# Patient Record
Sex: Male | Born: 1940 | Race: White | Hispanic: No | Marital: Single
Health system: Southern US, Community
[De-identification: ages and names within clinical notes are randomized; demographics above are authoritative.]

## PROBLEM LIST (undated history)

## (undated) DIAGNOSIS — R32 Unspecified urinary incontinence: Secondary | ICD-10-CM

## (undated) DIAGNOSIS — E039 Hypothyroidism, unspecified: Secondary | ICD-10-CM

## (undated) DIAGNOSIS — F32A Depression, unspecified: Secondary | ICD-10-CM

## (undated) DIAGNOSIS — W19XXXA Unspecified fall, initial encounter: Secondary | ICD-10-CM

## (undated) DIAGNOSIS — K8020 Calculus of gallbladder without cholecystitis without obstruction: Secondary | ICD-10-CM

## (undated) DIAGNOSIS — H5702 Anisocoria: Secondary | ICD-10-CM

## (undated) DIAGNOSIS — S32509A Unspecified fracture of unspecified pubis, initial encounter for closed fracture: Secondary | ICD-10-CM

## (undated) DIAGNOSIS — I700 Atherosclerosis of aorta: Secondary | ICD-10-CM

## (undated) DIAGNOSIS — R140 Abdominal distension (gaseous): Secondary | ICD-10-CM

## (undated) DIAGNOSIS — F200 Paranoid schizophrenia: Secondary | ICD-10-CM

## (undated) DIAGNOSIS — R0602 Shortness of breath: Secondary | ICD-10-CM

## (undated) DIAGNOSIS — S3210XA Unspecified fracture of sacrum, initial encounter for closed fracture: Secondary | ICD-10-CM

## (undated) DIAGNOSIS — F79 Unspecified intellectual disabilities: Secondary | ICD-10-CM

## (undated) DIAGNOSIS — I639 Cerebral infarction, unspecified: Secondary | ICD-10-CM

## (undated) DIAGNOSIS — Z9181 History of falling: Secondary | ICD-10-CM

## (undated) DIAGNOSIS — I63511 Cerebral infarction due to unspecified occlusion or stenosis of right middle cerebral artery: Secondary | ICD-10-CM

## (undated) DIAGNOSIS — R739 Hyperglycemia, unspecified: Secondary | ICD-10-CM

## (undated) DIAGNOSIS — S0181XA Laceration without foreign body of other part of head, initial encounter: Secondary | ICD-10-CM

## (undated) DIAGNOSIS — F23 Brief psychotic disorder: Secondary | ICD-10-CM

## (undated) DIAGNOSIS — I6529 Occlusion and stenosis of unspecified carotid artery: Secondary | ICD-10-CM

## (undated) DIAGNOSIS — F329 Major depressive disorder, single episode, unspecified: Secondary | ICD-10-CM

## (undated) HISTORY — DX: Atherosclerosis of aorta: I70.0

## (undated) HISTORY — DX: Paranoid schizophrenia: F20.0

## (undated) HISTORY — DX: Occlusion and stenosis of unspecified carotid artery: I65.29

## (undated) HISTORY — DX: Unspecified fracture of sacrum, initial encounter for closed fracture: S32.10XA

## (undated) HISTORY — DX: Shortness of breath: R06.02

## (undated) HISTORY — DX: Cerebral infarction, unspecified: I63.9

## (undated) HISTORY — DX: Unspecified fall, initial encounter: W19.XXXA

## (undated) HISTORY — DX: Hypothyroidism, unspecified: E03.9

## (undated) HISTORY — DX: Calculus of gallbladder without cholecystitis without obstruction: K80.20

## (undated) HISTORY — DX: Anisocoria: H57.02

## (undated) HISTORY — DX: Unspecified urinary incontinence: R32

## (undated) HISTORY — DX: Unspecified fracture of unspecified pubis, initial encounter for closed fracture: S32.509A

## (undated) HISTORY — DX: Hyperglycemia, unspecified: R73.9

## (undated) HISTORY — DX: History of falling: Z91.81

## (undated) HISTORY — DX: Laceration without foreign body of other part of head, initial encounter: S01.81XA

## (undated) HISTORY — DX: Abdominal distension (gaseous): R14.0

## (undated) HISTORY — DX: Cerebral infarction due to unspecified occlusion or stenosis of right middle cerebral artery: I63.511

---

## 1997-09-07 ENCOUNTER — Other Ambulatory Visit: Admission: RE | Admit: 1997-09-07 | Discharge: 1997-09-07 | Payer: Self-pay | Admitting: Family Medicine

## 1997-09-07 ENCOUNTER — Ambulatory Visit (HOSPITAL_COMMUNITY): Admission: RE | Admit: 1997-09-07 | Discharge: 1997-09-07 | Payer: Self-pay | Admitting: Family Medicine

## 1997-09-10 ENCOUNTER — Other Ambulatory Visit: Admission: RE | Admit: 1997-09-10 | Discharge: 1997-09-10 | Payer: Self-pay | Admitting: Family Medicine

## 2000-08-11 ENCOUNTER — Encounter: Payer: Self-pay | Admitting: Family Medicine

## 2000-08-11 ENCOUNTER — Ambulatory Visit (HOSPITAL_COMMUNITY): Admission: RE | Admit: 2000-08-11 | Discharge: 2000-08-11 | Payer: Self-pay | Admitting: Family Medicine

## 2001-08-14 ENCOUNTER — Encounter (HOSPITAL_BASED_OUTPATIENT_CLINIC_OR_DEPARTMENT_OTHER): Payer: Self-pay | Admitting: General Surgery

## 2001-08-15 ENCOUNTER — Ambulatory Visit (HOSPITAL_COMMUNITY): Admission: RE | Admit: 2001-08-15 | Discharge: 2001-08-15 | Payer: Self-pay | Admitting: General Surgery

## 2001-08-15 ENCOUNTER — Encounter (INDEPENDENT_AMBULATORY_CARE_PROVIDER_SITE_OTHER): Payer: Self-pay | Admitting: Specialist

## 2005-07-22 ENCOUNTER — Ambulatory Visit (HOSPITAL_COMMUNITY): Admission: RE | Admit: 2005-07-22 | Discharge: 2005-07-22 | Payer: Self-pay | Admitting: Family Medicine

## 2005-12-13 ENCOUNTER — Encounter: Admission: RE | Admit: 2005-12-13 | Discharge: 2005-12-13 | Payer: Self-pay | Admitting: Gastroenterology

## 2005-12-19 ENCOUNTER — Encounter: Admission: RE | Admit: 2005-12-19 | Discharge: 2005-12-19 | Payer: Self-pay | Admitting: Gastroenterology

## 2006-03-01 ENCOUNTER — Ambulatory Visit (HOSPITAL_COMMUNITY): Admission: RE | Admit: 2006-03-01 | Discharge: 2006-03-01 | Payer: Self-pay | Admitting: Gastroenterology

## 2006-09-06 ENCOUNTER — Ambulatory Visit (HOSPITAL_COMMUNITY): Admission: RE | Admit: 2006-09-06 | Discharge: 2006-09-06 | Payer: Self-pay | Admitting: Gastroenterology

## 2007-01-11 ENCOUNTER — Inpatient Hospital Stay (HOSPITAL_COMMUNITY): Admission: EM | Admit: 2007-01-11 | Discharge: 2007-01-15 | Payer: Self-pay | Admitting: Emergency Medicine

## 2007-01-11 ENCOUNTER — Encounter: Payer: Self-pay | Admitting: Emergency Medicine

## 2007-05-13 ENCOUNTER — Ambulatory Visit (HOSPITAL_COMMUNITY): Admission: RE | Admit: 2007-05-13 | Discharge: 2007-05-13 | Payer: Self-pay | Admitting: Gastroenterology

## 2007-12-11 ENCOUNTER — Inpatient Hospital Stay (HOSPITAL_COMMUNITY): Admission: EM | Admit: 2007-12-11 | Discharge: 2007-12-17 | Payer: Self-pay | Admitting: Emergency Medicine

## 2007-12-13 ENCOUNTER — Ambulatory Visit: Payer: Self-pay | Admitting: Infectious Diseases

## 2007-12-22 ENCOUNTER — Inpatient Hospital Stay (HOSPITAL_COMMUNITY): Admission: EM | Admit: 2007-12-22 | Discharge: 2007-12-26 | Payer: Self-pay | Admitting: Emergency Medicine

## 2010-10-04 NOTE — Consult Note (Signed)
NAME:  Scott [name], Scott [name] NO.:  [id number]   MEDICAL RECORD NO.:  [id number]          PATIENT TYPE:  INP   LOCATION:  1511                         FACILITY:  [facility]   PHYSICIAN:  Lindaann Slough, M.D.  DATE OF BIRTH:  11/13/1940   DATE OF CONSULTATION:  12/14/2007  DATE OF DISCHARGE:                                 CONSULTATION   REASON FOR CONSULTATION:  Urethral bleeding.   The patient is 70 year old male resident of a group home with a history  of schizophrenia who was admitted on December 11, 2007 with confusion,  weakness, unsteadiness.  He was found to have urinary tract infection  and he was treated with Bactrim.  A Foley catheter was inserted in the  bladder and during his period of confusion he pulled it out and started  having urethral bleeding.  The catheter was then reinserted and left  indwelling and removed this morning, and after removal of the Foley  started having again a large amount of urethral bleeding.  The catheter  was reinserted in the bladder and started draining clear urine.  I was  then asked to see the patient for further evaluation and treatment.   PAST MEDICAL HISTORY:  There is a history of schizophrenia and he  resides in a group home.   PAST SURGICAL HISTORY:  Umbilical hernia repair.   MEDICATIONS:  1. Benztropine 1 mg daily.  2. Gabapentin 600 mg twice a day.  3. Levothyroxine 875 mcg daily.  4. Niacin 500 mg 4 times daily.  5. Paroxetine 40 mg a day.  6. Risperdal 1 mg 3 times daily.   SOCIAL HISTORY:  He lives in a group home.  He is a former smoker and  had smoked a half a pack a day.  He does not drink.   FAMILY HISTORY:  Positive for coronary artery disease in his father, and  hypertension.   REVIEW OF SYSTEMS:  Is as per history and no history could be obtained  from the patient.  When I saw him this evening he was complaining of  suprapubic pain.   PHYSICAL EXAMINATION:  This is a 70 year old male who has a history  of  schizophrenia.  He is alert but is not totally oriented.  Blood pressure  is 120/60, pulse 95, respirations 20, temperature 98.5.  SKIN: No rash  ABDOMEN:  Soft,  tender in the suprapubic area.  He has no hepatomegaly  and no splenomegaly.  Bladder is distended.  He has no inguinal  adenopathy.  Penis is circumcised.  There is a Foley catheter in the  urethra and the catheter does not appear to be in the bladder.  Scrotal  contents are within normal limits.  He has no testicular mass.  RECTAL EXAMINATION:  Sphincter tone is normal.  Prostate is enlarged 40  g without any nodules.   I deflated the balloon of the Foley catheter and repositioned the  catheter in the bladder and he spontaneously drained about 1800 mL of  clear urine.  He did not have any more suprapubic pain and the  suprapubic distention is no longer present.   Urine culture on 07/22 showed no growth.  BUN on July 25 was 8,  creatinine 0.68.   Urinary retention, urethral bleeding, schizophrenia.   SUGGESTIONS:  Leave Foley catheter indwelling for now.  Voiding trial  before discharge.  Ice packs to the penis.      Lindaann Slough, M.D.  Electronically Signed     MN/MEDQ  D:  12/14/2007  T:  12/14/2007  Job:  [id number]

## 2010-10-04 NOTE — Discharge Summary (Signed)
NAME:  Scott [name], LO NO.:  [id number]   MEDICAL RECORD NO.:  [id number]          PATIENT TYPE:  INP   LOCATION:  1522                         FACILITY:  [facility]   PHYSICIAN:  Isidor Holts, M.D.  DATE OF BIRTH:  02/01/1941   DATE OF ADMISSION:  01/11/2007  DATE OF DISCHARGE:  01/15/2007                               DISCHARGE SUMMARY   DISCHARGE DIAGNOSES:  1. Escherichia coli urinary tract infection.  2. Mild dehydration.  3. Altered mental status, secondary to #'s 1 and 2 above.  4. History of schizophrenia/autism.  5. Hypothyroidism.  6. Right inguino-scrotal hernia.   DISCHARGE MEDICATIONS:  1. Cogentin 1 mg p.o. daily.  2. Paxil 40 mg p.o. daily.  3. Risperdal 1 mg p.o. q.a.m. and 2 mg p.o. q.h.s.  4. Synthroid 75 mcg q. daily (was on 100 mcg p.o. daily).  5. Neurontin 600 mg p.o. t.i.d.  6. Niacin 500 mg p.o. daily.  7. Ciprofloxacin 500 mg p.o. b.i.d., to be completed on January 17, 2007.   PROCEDURES:  1. Chest x-ray dated January 11, 2007.  This was a low volume film with      prominent cardiomediastinal contours.  There was mild vascular      congestion without overt pulmonary edema.  2. Head CT scan dated January 11, 2007.  This showed chronic      microvascular changes.  No definite acute abnormality.  There was a      retention cyst in the right sphenoid sinus.   CONSULTATIONS:  None.   ADMISSION HISTORY:  As in H&P notes of January 11, 2007 dictated by Dr.  Della Goo.  However, in brief this is a 70 year old male resident  of a group home, with known history of schizophrenia, autism, obsessive-  compulsive disorder, hypothyroidism, who presents with altered mental  status described as confusion, and also weakness.  Reportedly, the  patient had been evaluated at [facility] for altered mental  status, was found to have a UTI, given a single dose of iv Rocephin and  sent home on oral antibiotic therapy.  He re-presented,  because of  continued symptoms and difficulty ambulating, was evaluated at [facility] ED, and was admitted for further evaluation,  investigation and management.   CLINICAL COURSE:  1. Urinary tract infection.  For details of presentation, refer to      admission history above.  The patient's urinalysis confirmed      positive sediment consistent with urinary tract infection.  He was      started on intravenous Ciprofloxacin.  Urine culture subsequently      grew E. Coli, which was pan-sensitive.  The patient responded to      above treatment measures.  WCC which was initially elevated at 12.3      on January 12, 2007 and dropped down to 5.1 on January 13, 2007.  The      patient remained afebrile during the course of his hospitalization,      became much more alert.  Mental status  appeared back to baseline,      and patient was able to ambulate, following evaluation by PT/OT on      January 14, 2007.   1. Mild dehydration.  The patient at time of initial presentation was      found to be mildly dehydrated.  BUN was 11, creatinine 0.72.  He      was managed with intravenous infusion of normal saline, with      satisfactory clinical response, and by January 13, 2007, BUN had      normalized at 7, with a creatinine of 0.67, patient's oral intake      was quite good, and IV fluids were therefore discontinued.   1. Hypothyroidism.  The patient is a known case of hypothyroidism, and      at presentation was on 100 mcg per day of Synthroid.  TSH, however,      was found to be somewhat low at 0.306.  Synthroid dosage has      therefore been decreased to 75 mcg p.o. daily.   1. Right inguino-scrotal hernia.  The patient apparently is known to      have this abnormality, and had been scheduled for an appointment      with Dr. Lurene Shadow, general surgeon, in the coming week.  We have      recommended that he keep this appointment, for definitive      management.  Certainly,  during the course of this hospitalization,      there was no problems referable to this, and objective examination      revealed no evidence of incarceration.   DISPOSITION:  The patient was considered sufficiently clinically  recovered and stable to be transitioned to oral antibiotic treatment on  January 14, 2007, for completion of a total of 7 days treatment to be  concluded on 01/17/07.  Provided no acute problems arise overnight, he  will be discharged on January 15, 2007, back to the group home.   DIET:  No restrictions, activity as tolerated.   FOLLOWUP INSTRUCTIONS:  The patient is to follow up with Dr. Lurene Shadow,  general surgeon, per prior scheduled appointment.      Isidor Holts, M.D.  Electronically Signed     CO/MEDQ  D:  01/14/2007  T:  01/14/2007  Job:  [id number]

## 2010-10-04 NOTE — Consult Note (Signed)
NAME:  AIZEN, DUVAL NO.:  [id number]   MEDICAL RECORD NO.:  [id number]          PATIENT TYPE:  INP   LOCATION:  1513                         [facility]AC[facility]  [facility]   PHYSICIAN:  Antonietta Breach, M.D.  DATE OF BIRTH:  1941/02/17   DATE OF CONSULTATION:  12/24/2007  DATE OF DISCHARGE:                                 CONSULTATION   REASON FOR CONSULTATION:  Psychosis.   HISTORY OF PRESENT ILLNESS:  Mr. Aleczander Fandino is a 70 year old male  admitted to the [facility] on December 22, 2007, with fever.   Mr. Lipsett has been demonstrating approximately 5 days of progressive  confusion, clouding of consciousness and thought disorganization.  Last  week, he pulled out an inflated Foley catheter which induced urethral  bleeding.   Mr. Batalla required 4 mg of Ativan over the past 24 hours, as well as 1  mg of Haldol   He continues with clouding of consciousness.   PAST PSYCHIATRIC HISTORY:  On review of the past medical record, the  patient developed a period of confusion in August 2008, that was  associated with a urinary tract infection.  He does have a history of  treatment with Paxil, Risperdal and Cogentin.   In the past medical record are diagnoses listed as schizophrenia, autism  and obsessive-compulsive disorder.   FAMILY PSYCHIATRIC HISTORY:  None known.   SOCIAL HISTORY:  The patients resides in a group home.  Occupation -  unemployed.  He does not drink alcohol or use illegal drugs.   PAST MEDICAL HISTORY:  Hypothyroidism, delirium, fever.   MEDICATIONS:  The MAR was reviewed.  1. The patient is on psychotropic medication.  2. Neurontin 500 mg b.i.d.  3. Synthroid 75 mcg daily.  4. Paxil 40 mg daily.  5. Risperdal 1 mg t.i.d.   ALLERGIES:  NO KNOWN DRUG ALLERGIES.   LABORATORY DATA:  Sodium 140, BUN 8, creatinine 0.71, glucose 89,  potassium 3.7, WBC 7.6, hemoglobin 12, platelet count 252.  Head CT  without contrast showed atrophy and  nonspecific white matter changes.  SGOT 75, SGPT 42.  RPR, TSH and ammonia all within normal limits.  EKG  QTC 466 milliseconds.   REVIEW OF SYSTEMS:  Constitutional:  head, eyes, ears, nose, throat,  mouth neurologic, psychiatric cardiovascular, respiratory,  gastrointestinal, genitourinary, skin, musculoskeletal, hematologic,  lymphatic, endocrine and metabolic all unremarkable.   PHYSICAL EXAMINATION:  VITAL SIGNS:  Temperature 98.8, pulse 107,  respiratory rate 25, blood pressure 120/79.  O2 saturation on room air  100%.  GENERAL APPEARANCE:  Mr. Christoffel is an elderly male reclining in his  hospital bed in a supine position with no abnormal involuntary  movements.   Bilateral elbow passive range of motion exam, no cogwheeling or  rigidity.   MENTAL STATUS EXAM:  Mr. Niblett does have agitation.  His eye contact is  poor.  He appears to be oriented to self.  When asked what his last name  is, he states his first name and then perseverates.  His attention is  decreased.  His concentration is poor.  He has clouding of  consciousness.  He does not have orientation grossly to time or place   Affect agitated, mood anxious.  Fund of knowledge and intelligence are  grossly below average.  Speech as above.  Thought process; perseveration  as above.  Thought content; the patient perseverates on his name.  There  is no evidence of current hallucinations or delusions, however, thought  content is difficult to discern.  His insight is obviously poor, as well  as his judgment.   ASSESSMENT:  AXIS I:  1. 293.00, delirium, not otherwise specified.  2. By history, rule out schizophrenia and obsessive-compulsive      disorder.  AXIS II:  Deferred.  AXIS III:  See past medical history.  AXIS IV:  General medical.  AXIS V:  Global Assessment of Functioning 20.   Mr. Mcfarland may be experiencing some akathisia.  It is noted that he was  on regular Cogentin at his group home.  It is also noted  that he is not  displaying rigidity or cogwheeling on Risperdal.   This suggests that he was on Cogentin for akathisia.  This cannot be  certain, however, a low-dose of Cogentin would have a low risk of  contributing to delirium and may help to reduce agitation.  The  akathisia is contributing to agitation.   RECOMMENDATIONS:  Would start Cogentin 1 mg p.o. daily   RECOMMENDATIONS:  1. If the patient is not taking Risperdal by mouth and refusing      medication, would discontinue Risperdal and start Haldol at 2 mg      p.o. IM or slow push IV b.i.d.  2. Low stimulation ego support.      Antonietta Breach, M.D.  Electronically Signed     JW/MEDQ  D:  12/24/2007  T:  12/24/2007  Job:  [id number]

## 2010-10-04 NOTE — H&P (Signed)
NAME:  Scott [name], Scott [name] NO.:  [id number]   MEDICAL RECORD NO.:  [id number]          PATIENT TYPE:  INP   LOCATION:  1522                         FACILITY:  [facility]   PHYSICIAN:  Della Goo, M.D. DATE OF BIRTH:  August 19, 1940   DATE OF ADMISSION:  01/11/2007  DATE OF DISCHARGE:                              HISTORY & PHYSICAL   PRIMARY CARE PHYSICIAN:  Unassigned.   CHIEF COMPLAINT:  Weakness.   HISTORY OF PRESENT ILLNESS:  This is a 70 year old male who was brought  to the emergency department from his group home secondary to complaints  of confusion and weakness.  The patient had been seen earlier at [facility] for confusion and was evaluated and found to have a  urinary tract infection, placed on oral antibiotic therapy of Cipro on  discharge and also had been administered Rocephin therapy IV times one  dose.  The patient returned to the group home.  However, continued to  worsen and had difficulty walking and standing and was transported back  to [facility] emergency department for reevaluation.Marland Kitchen   PAST MEDICAL HISTORY:  History of schizophrenia, infantile autism,  obsessive-compulsive disorder, hypothyroidism.   MEDICATIONS:  1. Cogentin 1 mg one p.o. daily  2. Paxil 40 mg one p.o. daily  3. Risperdal 1 mg p.o. at 8:00 a.m. and 12:00 a.m. and q.h.s.  4. Synthroid 0.1 mg one p.o. daily   PAST SURGICAL HISTORY:  History of an umbilical hernia repair.   SOCIAL HISTORY:  The patient lives at a group home.  He is a current  smoker, smokes a half-a-pack per day.  He is a nondrinker and no history  of drug abuse.   FAMILY HISTORY:  Positive for coronary artery disease in his father and  positive for hypertension in his family.  Most of the medical history  has been given by his sister who is at bedside.   PHYSICAL EXAMINATION FINDINGS:  This is an overweight 70 year old male  in no visible discomfort or distress.  His Vital Signs: Temperature  100.9, blood pressure 106/63, heart rate 92, respirations 26.  HEENT: Examination normocephalic, atraumatic.  Pupils equally round  reactive to light.  Extraocular muscles are intact, funduscopic benign,  oropharynx is clear.  NECK is supple full range of motion.  No thyromegaly, adenopathy,  jugular venous distention.  CARDIOVASCULAR:  Tachycardiac rate and rhythm.  No murmurs, gallops or  rubs.  LUNGS: Clear to auscultation bilaterally.  ABDOMEN:  Positive bowel sounds, soft, nontender, nondistended.  No  rebound or guarding.  No suprapubic tenderness.  No costovertebral angle  tenderness.  NEUROLOGIC:  Examination the patient is alert and oriented  x 3.  He is able to move all four of his extremities.  His gait appears  steady.  No gross focal deficits.   ASSESSMENT:  70 year old male being admitted with  1. Acute mental status changes.  2. Urinary tract infection.  3. Hypothyroidism.  4. Hypertension.  5. Infantile autism.  6. Schizophrenia.   PLAN:  The patient will be admitted, placed on IV antibiotic therapy of  ciprofloxacin.  IV fluids have also been ordered.  The patient will  continue on his regular medications and DVT and GI prophylaxis have been  ordered.      Della Goo, M.D.  Electronically Signed     HJ/MEDQ  D:  01/12/2007  T:  01/13/2007  Job:  [id number]

## 2010-10-04 NOTE — Discharge Summary (Signed)
NAME:  Scott [name], Scott [name] NO.:  [id number]   MEDICAL RECORD NO.:  [id number]          PATIENT TYPE:  INP   LOCATION:  1513                         FACILITY:  [facility]   PHYSICIAN:  Beckey Rutter, MD  DATE OF BIRTH:  08-03-1940   DATE OF ADMISSION:  12/22/2007  DATE OF DISCHARGE:  12/26/2007                               DISCHARGE SUMMARY   CHIEF COMPLAINT:  The patient brought into the ED because of frequent  falls.   PROBLEMS:  1. Frequent falls.  This is felt secondary to altered mental status.      At the time of the assessment, this altered mental status felt      secondary to a urinary tract infection on the baseline of changed      mentation and multiple psychotropic medications.  The patient      improved on the antibiotic ciprofloxacin and the fluids which also      stabilized his hypertension.  The patient then was transferred to      regular floor and he has been doing well on the floor, especially      after in the restraints were discontinued.  The patient will be      discharged to continue on ciprofloxacin and the psychotropic      medication as discussed below.  2. Schizophrenia, infantile autism, and obsessive-compulsive disorder.      For those multiple psychiatric problems, the patient was seen in      consultation by Dr. Antonietta Breach.  Dr. Jeanie Sewer recommended      Haldol 2 mg p.o. or intramuscularly, or even intravenously with IV      push if the patient was not be able to take Risperdal per mouth.  3. The patient was pulling his Foley catheter and he had probably      traumatized his urethra and bladder.  The patient has been stable      now after the Foley catheter was discontinued.  He is voiding      normally.   PROCEDURE:  Blood cultures were negative up to date.  This culture was  obtained and December 22, 2007.  Recent labs were showing sodium 139,  potassium 3.4, chloride 109, bicarb 23, glucose 94, BUN 4 and creatinine  0.7, white  blood count is 7.1, hemoglobin is 11.7, hematocrit 33.8 and  platelet count is 268,000.   HOSPITAL CONSULTATION:  Dr. Antonietta Breach, psychiatrist.   RADIOLOGICAL IMAGING:  The patient had chest x-ray on December 22, 2007  showing no acute cardiopulmonary abnormality.  The patient has renal  ultrasound which is showing the result negative for hydronephrosis,  probable cholelithiasis, right hepatic cyst.   DISCHARGE DIAGNOSES:  1. Altered mental status felt secondary to psychotropic medication and      urinary tract infection.  2. Schizophrenia.  3. Infantile autism.  4. Obsessive-compulsive disorder.  5. Multiple admission with febrile illness.  6. Hypothyroidism.   DISCHARGE MEDICATIONS:  1. Ciprofloxacin 500 mg p.o. b.i.d. for five more days.  2. Benzatropine mg p.o. daily.  3. Neurontin 600 mg t.i.d.  4. Synthroid 75 mg daily.  5. Niacin 500 mg 4 times a day.  6. Paroxetine 40 mg once a day.  7. Risperdal 1 mg t.i.d.  If the Risperdal cannot be given, the      patient should take Haldol 2 mg p.o. or IM or slow push of IV twice      a day.   DISCHARGE/PLAN:  The patient is stable for discharge today.  The  discharge plan was formulated after discussion with Dr. Antonietta Breach.  The patient might need followup with renal and abdominal ultrasound to  evaluate the hepatic cyst in 4 to 6 weeks.   CONDITION ON DISCHARGE:  Stable.      Beckey Rutter, MD  Electronically Signed     EME/MEDQ  D:  12/26/2007  T:  12/26/2007  Job:  [id number]

## 2010-10-04 NOTE — Consult Note (Signed)
NAME:  RAS, KOLLMAN NO.:  [id number]   MEDICAL RECORD NO.:  [id number]          PATIENT TYPE:  INP   LOCATION:  1513                         FACILITY:  [facility]   PHYSICIAN:  Antonietta Breach, M.D.  DATE OF BIRTH:  [birth year]   DATE OF CONSULTATION:  12/26/2007  DATE OF DISCHARGE:  12/26/2007                                 CONSULTATION   Scott [name] has responded well to his psychotropic medication which has  included regular Haldol 1 mg every 6 hours, which is currently written  p.r.n. With this combination and his Risperdal 1 mg t.i.d., with his  Cogentin, he is cooperative.  There is no agitation or combativeness.  He has no thoughts of harming himself or others.  He has no evidence of  internal stimulation or delusions.   He states that he likes country music when asked.  He does have a  limited response to questions and does not demonstrate adequate social  reciprocity.  However, this is part of his baseline autistic negative  symptom schizophrenic disposition.   He is not having any adverse medication effects.   REVIEW OF SYSTEMS:  NEUROLOGIC:  No stiffness or other extrapyramidal  side effects.   LABORATORY DATA:  Sodium 139, BUN 4, creatinine 0.70, glucose 94.  WBC  7.1, hemoglobin 11.7, platelet count 268.   EXAMINATION:  VITAL SIGNS:  Temperature 98.2, pulse 103, respiratory  rate 18, blood pressure 148/84, O2 saturation on room air 95%.   MENTAL STATUS EXAM:  Scott [name] is alert.  He avoids eye contact.  He is  oriented to self as well as place.  His thought process involves slight  echolalia.  There is coherent thought content.  No evidence of  hallucinations or paranoia.  He has no thoughts of harming himself or  others.  His mood is normal.  He states his interest in country music.   ASSESSMENT:  Axis I:  293.00 Delirium, not otherwise specified, now  clear.  298.83 Mood disorder, not otherwise specified, stable.  293.82 Psychotic disorder,  not otherwise specified, stable.   RECOMMENDATION:  Scott [name] has demonstrating that he is psychiatrically  cleared for discharge back to his group home.  1. Would continue his Cogentin 1 mg daily for anti-EPS .  2. Would continue Paxil 40 mg daily for antidepression.  3. Also would continue Risperdal 1 mg t.i.d. for antipsychosis and      mood stabilization.  4. Would continue to augment the Risperdal with Haldol 1 mg t.i.d.      This regimen has resulted in his      stabilization.  5. Psychiatric followup can be obtained at one of the clinics attached      to [facility], [facility] or [facility] as well as      the [facility].      Antonietta Breach, M.D.  Electronically Signed     JW/MEDQ  D:  12/26/2007  T:  12/26/2007  Job:  [id number]

## 2010-10-04 NOTE — H&P (Signed)
NAME:  Scott [name], Scott [name] NO.:  [id number]   MEDICAL RECORD NO.:  [id number]          PATIENT TYPE:  EMS   LOCATION:  ED                           FACILITY:  [facility]   PHYSICIAN:  Lucita Ferrara, MD         DATE OF BIRTH:  16-Sep-1940   DATE OF ADMISSION:  12/22/2007  DATE OF DISCHARGE:                              HISTORY & PHYSICAL   HISTORY OF PRESENT ILLNESS:  The patient is 70 year old with brought in  from skilled nursing facility with altered mental status, fevers and  fall at the skilled nursing facility.  Fall has not been documented here  as I do not have the records from skilled nursing facility.  I cannot  tell if there was any head trauma, but there is no obvious head trauma  by looking at the patient.  There is no reported history of nausea,  vomiting, diarrhea, cough.  Of note, the patient has recurrent similar  episodes in addition on January 12, 2007, readmission on December 11, 2007  with unsteadiness, weakness, fevers and probable urinary tract infection  during that time.  He was admitted December 11, 2007.  On December 14, 2007, he  was evaluated by Urology for urethral bleed.  He was discharged with  febrile illness of unknown etiology.  Septic workup including blood  cultures, urinalysis as well as chest x-ray was unremarkable.  The  patient was covered with empiric antibiotics, vancomycin and Rocephin.  The patient was evaluated by infectious disease specialist, Dr. Johny Sax who evaluated the patient and concluded this likely a viral  syndrome, although his H1N1 nasopharyngeal swabs were negative.  The  patient then was discharged home 2 days after due to a delay after he  had pulled out his urethral catheter and caused some local trauma.  At  the time of discharge, he is quite stable.  Upon arrival to the  emergency room, Foley catheter placed.  While it was inflated, the  patient pulled it out and had a copious amount of blood that was drained  and there  was discharge from the area.  Thereafter, Foley catheter was  reinserted, and so far the area has stopped bleeding.   PAST MEDICAL HISTORY:  1. Schizophrenia.  2. Infantile autism.  3. Obsessive compulsive disorder.  4. Recent admission for febrile illness.  5. Hypothyroidism.   PAST SURGICAL HISTORY:  History of umbilical hernia repair.   MEDICATIONS SKILLED NURSING FACILITY:  Per ED documentation.  1. Benztropine 1 mg p.o. daily.  2. Gabapentin 600 mg t.i.d.  3. Levothyroxine 75 mg daily.  4. Niacin 500 mg four times a day.  5. Paroxetine 40 mg once daily.  6. Risperdal 1 mg t.i.d.   SOCIAL HISTORY:  The patient was in a group home former smoker.  Denies  drugs alcohol.   FAMILY HISTORY:  Positive for coronary artery disease in father,  positive for hypertension in other family.   PHYSICAL EXAMINATION:  GENERAL:  The patient is in no acute distress.  He is confused and altered as far as  his mental status goes.  VITAL SIGNS:  Temperature 97.8, pulse 102, respirations 18, blood  pressure 89/67 as the lowest blood pressure, pulse ox 95% on room air.  HEENT:  Normocephalic, atraumatic.  Sclerae anicteric.  NECK:  Supple.  No JVD, no carotid bruits.  PERLA.  Extraocular  movements intact.  Mucous membranes dry.  CARDIOVASCULAR:  S1-S2 tachy.  LUNGS:  Clear to auscultation bilaterally.  No rhonchi, rales or  wheezes.  NEUROLOGICAL:  Cannot be fully assessed, but the patient is alert, not  oriented.  Cranial nerves II-XII grossly intact.   LABORATORY DATA/RADIOLOGICAL DATA:  Chest x-ray shows no acute  cardiopulmonary disease.  Urinalysis:  Complete metabolic panel shows  mildly low sodium 134, BUN is 17, creatinine is 1.44.  On day of  discharge, the patient's creatinine was 0.71.   ASSESSMENT:  A 66-year with;  1. Altered mental status from skilled nursing facility with baseline      schizophrenia and autism.  2. Questionable urinary tract infection that is recurrent  3.  Hypothyroidism.  4. Recent admission for similar episode altered mental status, febrile      illness, status post infectious disease consultation.  Presumably a      viral infection was the working diagnosis, status post CT scan of      the head which was negative and status post a urology consultation      for traumatic Foley catheter.  5. Hypotension.  6. History of traumatic hematuria secondary to Foley catheter that was      pulled out that was inflated.  Status post Urology evaluation.  7. Dehydration, likely secondary to prerenal azotemia, although given      that the patient has had traumatic areas to the urethra, rule out      postobstructive etiology.   DISCUSSION/PLAN:  The patient has quite altered mental status.  I  believe this is likely chronic with most likely exacerbation secondary  to urinary tract infection.  Will initiate IV fluids given his  hypotension.  Will initiate sepsis protocol.  Will initiate broad-  spectrum antibiotics.  Most likely needs Levaquin versus Primaxin for  urinary tract infection coverage.  Given his altered mental status, will  use restraints with caution.  Although, at this point, I do believe he  needs it secondary to causing self-harm with pulling out his Foley  catheter.  This is essential to measure his urine output, however.  Will  initiate metabolic workup with TSH, ammonia level, B12, RPR, folate.  His symptoms are nonfocal.  His CT scan of the head again is negative.  Urology will be consulted if the patient has persistent hematuria.  Will  apply ice to the area.  We may consider doing a CT scan of the abdomen  and pelvis versus bilateral renal ultrasound if his kidney function does  not improve.  Must rule out postobstructive neuropathy.  In addition to  above, I do believe that we will benefit from psychiatry consultation  given history of schizophrenia and given recurrent episodes of altered  mental status.  The consultation will be  for the purposes of medication  management.  I am going to put him on p.o. Zyprexa for now.      Lucita Ferrara, MD  Electronically Signed     RR/MEDQ  D:  12/22/2007  T:  12/22/2007  Job:  [id number]

## 2010-10-04 NOTE — Discharge Summary (Signed)
NAME:  Scott [name], Scott [name] NO.:  [id number]   MEDICAL RECORD NO.:  [id number]          PATIENT TYPE:  INP   LOCATION:  1511                         FACILITY:  [facility]   PHYSICIAN:  Isidor Holts, M.D.  DATE OF BIRTH:  07/13/1940   DATE OF ADMISSION:  12/11/2007  DATE OF DISCHARGE:  12/17/2007                               DISCHARGE SUMMARY   For discharge diagnoses, consultations, procedures, detailed clinical  course, refer to interim discharge summary dictated 12/15/2007.  In  addition, for the period from 12/16/2007 to 12/17/2007, the following  are pertinent:  The patient remained clinically stable.  However, he  once again pulled on his Foley catheter on 12/16/2007, with resultant  hematuria and some difficulty removing the balloon, which was lodged in  the urethra.  Dr. Vonita Moss, urologist, was called on consultation.  He  came over, successfully retrieved the Foley catheter by deflating  balloon.  Recommended replacement of Foley catheter which he did, and  application of ice packs.  There were no further incidents thereafter,  and we were successfully able to discontinue the patient's Foley  catheter in the a.m. of 12/17/2007 without any further incident.   DISPOSITION:  The patient was on 12/17/2007 considered clinically stable  for discharge.  He was therefore discharged accordingly.   SPECIAL INSTRUCTIONS:  Per family's request, the patient has been  restricted to only one or two soft drinks per day, at the group home.      Isidor Holts, M.D.  Electronically Signed     CO/MEDQ  D:  12/17/2007  T:  12/17/2007  Job:  [id number]

## 2010-10-04 NOTE — Op Note (Signed)
NAME:  PAYNE, GARSKE NO.:  [id number]   MEDICAL RECORD NO.:  [id number]          PATIENT TYPE:  AMB   LOCATION:  ENDO                         FACILITY:  [facility]   PHYSICIAN:  Graylin Shiver, M.D.   DATE OF BIRTH:  March 07, 1941   DATE OF PROCEDURE:  05/13/2007  DATE OF DISCHARGE:  05/13/2007                               OPERATIVE REPORT   PROCEDURE:  Colonoscopy to hepatic flexure.   INDICATION:  Suspected abnormality on barium enema in the left colon.   Informed consent was obtained after explanation of the risks of  bleeding, infection and perforation.   PREMEDICATION:  Fentanyl 100 mcg IV, Versed 10 mg IV.   PROCEDURE:  With the patient in the left lateral decubitus position, a  rectal exam was performed.  No masses were felt.  The Pentax colonoscope  was inserted into the rectum and advanced around a tortuous colon to the  region of the hepatic flexure.  The scope was brought out.  No  abnormalities were seen.  The cecum could not be reached.  There was no  suspicious abnormality in that portion, however, on barium enema.  He  tolerated the cecum well without complications.   IMPRESSION:  Normal colonoscopy to hepatic flexure.  No lesions seen.           ______________________________  Graylin Shiver, M.D.     SFG/MEDQ  D:  06/18/2007  T:  06/19/2007  Job:  [id number]

## 2010-10-04 NOTE — Discharge Summary (Signed)
NAME:  Scott [name], Scott [name] NO.:  [id number]   MEDICAL RECORD NO.:  [id number]          PATIENT TYPE:  INP   LOCATION:  1511                         FACILITY:  [facility]   PHYSICIAN:  Isidor Holts, M.D.  DATE OF BIRTH:  [birth year]   DATE OF ADMISSION:  12/11/2007  DATE OF DISCHARGE:  12/16/2007                               DISCHARGE SUMMARY   PRIMARY MEDICAL DOCTOR:  Gentry Fitz.   DISCHARGE DIAGNOSES:  1. Febrile illness secondary to viral syndrome.  2. Traumatic hematuria, secondary to Foley catheterization.  3. Hypothyroidism.  4. Smoking history.  5. Recent urinary tract infection.  6. History of schizophrenia/obsessive-compulsive disorder/infantile      autism.   DISCHARGE MEDICATIONS:  1. Benztropine 1 mg p.o. daily.  2. Gabapentin 600 mg p.o. b.i.d.  3. Levothyroxine 75 mcg p.o. daily.  4. Niacin 500 mg p.o. q.i.d.  5. Paroxetine 40 mg p.o. daily.  6. Risperidone 1 mg p.o. t.i.d.   PROCEDURES:  1. Chest x-ray dated December 09, 2007.  This showed low lung volumes      without acute cardiopulmonary process, upper thoracic compression      fracture age indeterminate.  Marked gaseous bowel distention in      upper abdomen.  2. Head CT scan dated December 12, 2007.  This was negative for bleed or      other acute intracranial process.  Atrophy and nonspecific white      matter changes.  Maxillary, ethmoid and sphenoid sinus disease.   CONSULTATIONS:  Lacretia Leigh. Ninetta Lights, M.D., Infectious Diseases  specialist.  Elfredia Nevins, MD., Urologist.   ADMISSION HISTORY:  As in H&P notes of December 09, 2007 dictated by Dr.  Lucita Ferrara.  However, in brief,  this is a 70 year old male resident of  group home, with known history of schizophrenia, infantile autism,  obsessive compulsive disorder, recurrent urinary tract infections,  hypothyroidism, smoking history, who presents with weakness,  unsteadiness and fever.  On initial evaluation he was found to have a  temperature of  103.4.  He was admitted for further evaluation,  investigation and management.   CLINICAL COURSE:  1. Febrile illness.  For details of presentation, refer to admission      history above.  Reportedly, the patient had been evaluated for      urinary tract infection 2-3 days prior to hospitalization, and      treated empirically with Bactrim.  However, he was found to be      febrile at the time of presentation.  Septic workup was carried      out, including blood cultures, urinalysis and cultures, as well as      chest x-ray.  Chest x-ray was unremarkable for acute pathology.      Urinalysis was negative for urinary tract infection.  Blood      cultures grew coagulase-negative Staphylococcus in 1 out of 4, but      this was deemed a contaminant.  At the time of presentation, he had      been empirically commenced on broad-spectrum antibiotic coverage  with a combination of Vancomycin and Rocephin.  Attempts were made      to do a lumbar puncture.  However, this did not prove practicable      as the patient was unable to remain still for the procedure.      Infectious diseases specialist consultation was kindly provided by      Dr. Johny Sax, who after careful evaluation of clinical data,      concluded that the patient likely had a viral syndrome.  He was      treated empirically with Tamiflu.  H1N1 nasopharyngeal swab was      sent for appropriate studies.  The patient is due to complete his      Tamiflu on December 16, 2007.  At the time of this dictation on December 15, 2007, H1N1 PCR reports were not yet available.  Be that as it      may, the patient defervesced by December 12, 2007 and has had no      recurrence of pyrexia since.  As mentioned above, it is likely that      the patient had a viral illness which has since resolved.   1. Recent urinary tract infection.  As mentioned in #1 above, patient      was treated with an empiric course of Bactrim in the group home.       Urinalysis during this hospitalization was negative.  Likely pre-      admission treatment was successful.   1. Hypothyroidism.  The patient was continued on pre-admission dosage      of Synthroid during this hospitalization.  TSH was normal at 0.427.   1. History of schizophrenia/obsessive compulsive disorder and      infantile autism.  The patient remained clinically stable from this      viewpoint, on pre-admission psychotropic medications.   1. Traumatic hematuria.  The patient on December 13, 2007, pulled out his      Foley catheter inadvertently, with balloon still intact.  This was      associated with some bleeding and gross hematuria, before his      catheter was reinserted without incident.  The patient was placed      under close observation.  By December 09, 2007 hematuria had cleared      up.  Foley catheter was therefore, discontinued at about 1:35 p.m.      on the same date.  However, once this was done, patient started      bleeding per urethram, necessitating reinsertion of Foley catheter      and requesting of Urology consultation, which was kindly provided      by Dr. Dr. Su Grand. For details of his consultation, refer to      consultation notes of  December 14, 2007.  He recommended leaving the      Foley catheter in situ, place ice packs to penis.  By December 15, 2007      no further hematuria was observed.  No further urethral bleeding.      Dr. Brunilda Payor has recommended a trial of removal of catheter in the a.m.      of July 27, 21009.   DISPOSITION:  The patient was on December 15, 2007, considered clinically  stable for discharge on December 16, 2007, back to the group home, provided  no acute problems arise in the interim.   DIET:  Heart-healthy.   ACTIVITY:  As tolerated.  SPECIAL INSTRUCTIONS:  Foley catheter will be discontinued in a.m. of  December 16, 2007.      Isidor Holts, M.D.  Electronically Signed     CO/MEDQ  D:  12/15/2007  T:  12/15/2007  Job:  [id number]

## 2010-10-04 NOTE — H&P (Signed)
NAME:  Scott [name], Scott [name] NO.:  [id number]   MEDICAL RECORD NO.:  [id number]          PATIENT TYPE:  INP   LOCATION:  1512                         FACILITY:  [facility]   PHYSICIAN:  Lucita Ferrara, MD         DATE OF BIRTH:  06/15/1940   DATE OF ADMISSION:  12/11/2007  DATE OF DISCHARGE:                              HISTORY & PHYSICAL   PRIMARY CARE PHYSICIAN:  Unassigned.   CHIEF COMPLAINT:  Unsteadiness, weakness and possible urinary tract  infection.   HISTORY OF PRESENT ILLNESS:  The patient is a 70 year old male with a  medical history of schizophrenia who presents to [facility] with chief complaint of confusion, weakness, and unsteadiness in  his feet. Symptoms have been ongoing for the last 2 or 3 days at which  urinalysis was conducted, and he was found to have a urinary tract  infection. He was placed empirically on Bactrim. Despite these efforts,  he continued to have worsening of his gait. He did not have any specific  falls. Given the circumstances, he was sent here for evaluation to the  emergency room. In the emergency room, urinalysis was found to be  negative. He was still quite unsteady on his feet. Further evaluation in  the ED found him to be mildly dehydrated with high BUN to creatinine  ratio. He denies any nausea or vomiting. He was found to have a  temperature of 103.4, and he was given Tylenol and not started on  empiric antibiotics just yet. As far as etiology for his fevers, he  denies any cough, shortness of breath. He denies recent travel or sick  contact. He denies diarrhea. Digital chest x-ray was conducted which was  essentially normal except it showed marked gaseous bowel distention in  the upper abdomen. Given these findings, more thorough history of as far  as his gastrointestinal system was conducted. He denies any focal  abdominal pain, nausea, vomiting, constipation or diarrhea although he  is not a good historian,  pertinent to history of present illness. The  patient has undergone a colonoscopy on May 13, 2007, for suspected  abnormality that was seen on barium enema in the left colon. Colonoscopy  was essentially normal. As far as his mental status goes, he denies any  focal neurological deficits. His mental status is essentially baseline.   PAST MEDICAL HISTORY:  Significant for:  1. Schizophrenia.  2. Infantile autism.  3. Obsessive compulsive disorder.  4. Recurrent urinary tract infection.  5. Hypothyroidism.   PAST SURGICAL HISTORY:  History of umbilical hernia repair.   MEDICATIONS:  1. Benztropine 1 mg p.o. daily.  2. Gabapentin 600 mg 2 times a day.  3. Levothyroxine 75 mcg daily.  4. Niacin 500 mg 4 times daily.  5. Paroxetine 40 mg daily.  6. Risperidone 1 mg 3 times a day.   SOCIAL HISTORY:  The patient lives in a group home. He has maintained  pretty constant functional status. He is a former smoker and smoked 1/2  pack per day. Denies drugs or alcohol.  FAMILY HISTORY:  Positive for coronary artery disease in his father and  positive for hypertension in his family.   PHYSICAL EXAMINATION:  Generally speaking, the patient is in no acute  distress. He is quite anxious, asking when he is going to be discharged  back to the group home.  His vital signs have been reviewed and as follows:  His T-max was 103.4,  respirations 20, pulse 88, blood pressure 132/78, pulse oximetry 99% on  room air.  HEENT:  Normocephalic, atraumatic. Sclerae nonicteric.  NECK:  Supple. No JVD. No carotid bruits. PERRLA. Extraocular muscles  intact.  CARDIOVASCULAR:  S1/S2. Regular rate and rhythm. No murmurs, rubs,  clicks.  ABDOMEN:  Soft, nontender, nondistended, positive bowel sounds.  LUNGS:  Clear to auscultation bilaterally. No rhonchi, rales or wheezes.  EXTREMITIES:  No clubbing, cyanosis, or edema.  NEUROLOGICAL:  The patient is anxious. He is alert and oriented x3.  Cranial nerves  II-XII grossly intact.   Chest x-ray shows marked gaseous bowel distention in the upper abdomen,  correlate clinically, otherwise no acute cardiopulmonary process. Upper  thoracic compression fracture age undetermined.   ASSESSMENT AND PLAN:  The patient is a 70 year old with:  1. Fevers of unknown source. No meningeal signs or neck pain, although      the patient is not the best historian.  2. Altered mental status from baseline/unsteady gait. Nonfocal      neurological evaluation. Again, no meningeal signs.  3. Recent urinary tract infection as outpatient but negative      urinalysis here.  4. Other Chronic issues: Hypothyroidism/schizophrenia/infantile      autism. All stable.  5. X-ray showing gaseous bowel distention with undistended abdomen.   DISCUSSION AND PLAN:  Obviously will need to rule out influenza H1N1. We  will have to repeat his urinalysis. Will initiate blood cultures.  Indications for antibiotics:  The patient is not hypotensive. There is  no leukocytosis, but patient does have a left shift. Given patient's  unclear baseline mental status, will definitely need to cover for  meningitis although less likely as patient has no meningeal signs. The  patient may have also have viral meningitis. Will initiate H1N1  protocol. Will empirically start him on Tamiflu, Rocephin x 1 and will  give him 1 dose of vancomycin x 1 for coverage, awaiting cultures.   Again, the rest of the plans are dependent on his progress.      Lucita Ferrara, MD  Electronically Signed     RR/MEDQ  D:  12/11/2007  T:  12/11/2007  Job:  [id number]

## 2010-10-07 NOTE — Op Note (Signed)
NAME:  Scott [name], GORRELL NO.:  [id number]   MEDICAL RECORD NO.:  [id number]          PATIENT TYPE:  AMB   LOCATION:  ENDO                         FACILITY:  [facility]   PHYSICIAN:  Graylin Shiver, M.D.   DATE OF BIRTH:  04-02-1941   DATE OF PROCEDURE:  09/06/2006  DATE OF DISCHARGE:                               OPERATIVE REPORT   PROCEDURE:  Attempted colonoscopy.   INDICATION:  Abnormal barium enema.   Informed consent was obtained from the power of attorney.   Sedation was achieved by the nurse anesthetist using propofol.   With the patient in the left lateral decubitus position a rectal exam  was performed.  No masses were felt.  The Pentax colonoscope was  inserted into the rectum, then into the sigmoid colon.  The patient was  so full of stool that I could not see the mucosa.  The procedure was  terminated.           ______________________________  Graylin Shiver, M.D.     SFG/MEDQ  D:  09/06/2006  T:  09/07/2006  Job:  [id number]

## 2010-10-07 NOTE — Op Note (Signed)
NAME:  Scott, [name] NO.:  [id number]   MEDICAL RECORD NO.:  [id number]          PATIENT TYPE:  AMB   LOCATION:  ENDO                         [facility]ACILITY:  [facility]   PHYSICIAN:  Graylin Shiver, M.D.   DATE OF BIRTH:  1940-12-02   DATE OF PROCEDURE:  03/01/2006  DATE OF DISCHARGE:                                 OPERATIVE REPORT   PROCEDURE:  Attempted colonoscopy.   INDICATIONS FOR PROCEDURE:  Abnormal barium enema.   Informed consent was obtained from the patient's power of attorney after  explanation of the risks of bleeding, infection and perforation.   The patient was sedated by the nurse anesthetist.   PROCEDURE:  With the patient in the left lateral decubitus position, a  rectal exam was performed.  I felt hard stool in the rectum.  The scope was  inserted into the rectum.  I saw hard stool.  I advanced it up into the  sigmoid to see if this would continue, and it did.  The patient was full of  stool despite taking his prep.  We were unable to see anything significant.  The procedure will have to be rescheduled           ______________________________  Graylin Shiver, M.D.     SFG/MEDQ  D:  03/01/2006  T:  03/03/2006  Job:  [id number]

## 2011-02-17 LAB — CBC
HCT: 36.4 — ABNORMAL LOW
HCT: 39.8
HCT: 39.8
HCT: 40
HCT: 40.4
HCT: 33.8 — ABNORMAL LOW
HCT: 34.6 — ABNORMAL LOW
Hemoglobin: 12.4 — ABNORMAL LOW
Hemoglobin: 12.6 — ABNORMAL LOW
Hemoglobin: 13.8
Hemoglobin: 13.8
Hemoglobin: 13.8
Hemoglobin: 11.3 — ABNORMAL LOW
Hemoglobin: 11.7 — ABNORMAL LOW
Hemoglobin: 12 — ABNORMAL LOW
MCHC: 34.1
MCHC: 34.2
MCHC: 34.5
MCHC: 34.6
MCHC: 34.7
MCV: 93.9
MCV: 94
MCV: 94.4
MCV: 94.7
Platelets: 147 — ABNORMAL LOW
Platelets: 252
Platelets: 282
RBC: 3.95 — ABNORMAL LOW
RBC: 4.26
RBC: 4.27
RBC: 4.28
RBC: 3.57 — ABNORMAL LOW
RDW: 12.1
RDW: 12.4
RDW: 12.4
RDW: 12.5
WBC: 6.5

## 2011-02-17 LAB — COMPREHENSIVE METABOLIC PANEL
ALT: 13
ALT: 15
ALT: 42
AST: 23
AST: 75 — ABNORMAL HIGH
Albumin: 3.9
Albumin: 2.7 — ABNORMAL LOW
Alkaline Phosphatase: 46
BUN: 12
CO2: 20
CO2: 21
CO2: 23
Calcium: 8.2 — ABNORMAL LOW
Calcium: 8.4
Calcium: 8.8
Creatinine, Ser: 0.77
Creatinine, Ser: 0.84
Creatinine, Ser: 1.11
GFR calc Af Amer: 59 — ABNORMAL LOW
GFR calc non Af Amer: >60
GFR calc non Af Amer: >60
Glucose, Bld: 100 — ABNORMAL HIGH
Glucose, Bld: 117 — ABNORMAL HIGH
Glucose, Bld: 135 — ABNORMAL HIGH
Potassium: 3.8
Potassium: 3.9
Sodium: 134 — ABNORMAL LOW
Total Bilirubin: 0.9
Total Bilirubin: 0.9
Total Protein: 4.6 — ABNORMAL LOW

## 2011-02-17 LAB — HIV-1 RNA ULTRAQUANT REFLEX TO GENTYP+
HIV 1 RNA Quant: <50 copies/mL (ref <50)
HIV-1 RNA Quant, Log: <1.7 (ref <1.70)

## 2011-02-17 LAB — DIFFERENTIAL
Basophils Absolute: 0
Basophils Relative: 0
Eosinophils Absolute: 0.2
Eosinophils Absolute: 0.3
Eosinophils Absolute: 0.3
Eosinophils Absolute: 0.1
Eosinophils Relative: 5
Eosinophils Relative: 0
Lymphocytes Relative: 2 — ABNORMAL LOW
Lymphocytes Relative: 38
Lymphs Abs: 0.1 — ABNORMAL LOW
Lymphs Abs: 0.4 — ABNORMAL LOW
Lymphs Abs: 2.7
Monocytes Absolute: 0.7
Monocytes Absolute: 1.2 — ABNORMAL HIGH
Monocytes Relative: 7
Neutrophils Relative %: 46
Neutrophils Relative %: 71

## 2011-02-17 LAB — BASIC METABOLIC PANEL
BUN: 8
BUN: 4 — ABNORMAL LOW
BUN: 8
CO2: 27
CO2: 28
Calcium: 8.5
Calcium: 8 — ABNORMAL LOW
Chloride: 109
Chloride: 110
Creatinine, Ser: 0.68
Creatinine, Ser: 0.7
GFR calc Af Amer: >60
GFR calc non Af Amer: >60
GFR calc non Af Amer: >60
Glucose, Bld: 112 — ABNORMAL HIGH
Glucose, Bld: 89
Glucose, Bld: 94
Potassium: 3 — ABNORMAL LOW
Potassium: 3.1 — ABNORMAL LOW
Potassium: 3.6
Potassium: 3.7
Sodium: 141
Sodium: 143

## 2011-02-17 LAB — URINALYSIS, ROUTINE W REFLEX MICROSCOPIC
Glucose, UA: NEGATIVE
Protein, ur: 30 — AB
Urobilinogen, UA: 1
pH: 5.5

## 2011-02-17 LAB — URINE CULTURE
Culture: NO GROWTH
Culture: NO GROWTH

## 2011-02-17 LAB — URINE MICROSCOPIC-ADD ON

## 2011-02-17 LAB — CULTURE, BLOOD (ROUTINE X 2)
Culture: NO GROWTH
Culture: NO GROWTH

## 2011-02-17 LAB — TSH: TSH: 0.427

## 2011-02-17 LAB — H1N1 SCREEN (PCR)

## 2011-03-03 LAB — CULTURE, BLOOD (ROUTINE X 2)
Culture: NO GROWTH
Culture: NO GROWTH
Culture: NO GROWTH

## 2011-03-03 LAB — URINE MICROSCOPIC-ADD ON

## 2011-03-03 LAB — DIFFERENTIAL
Basophils Absolute: 0
Basophils Relative: 0
Eosinophils Absolute: 0
Eosinophils Relative: 0
Lymphocytes Relative: 7 — ABNORMAL LOW
Lymphs Abs: 0.9
Monocytes Absolute: 1.4 — ABNORMAL HIGH
Monocytes Relative: 11
Neutro Abs: 10.1 — ABNORMAL HIGH
Neutrophils Relative %: 82 — ABNORMAL HIGH

## 2011-03-03 LAB — URINALYSIS, ROUTINE W REFLEX MICROSCOPIC
Bilirubin Urine: NEGATIVE
Glucose, UA: NEGATIVE
Ketones, ur: NEGATIVE
Nitrite: POSITIVE — AB
Protein, ur: 30 — AB
Specific Gravity, Urine: 1.021
Urobilinogen, UA: 1
pH: 6

## 2011-03-03 LAB — BASIC METABOLIC PANEL
BUN: 9
CO2: 20
CO2: 22
CO2: 24
Calcium: 8.2 — ABNORMAL LOW
GFR calc Af Amer: >60
GFR calc Af Amer: >60
GFR calc non Af Amer: >60
GFR calc non Af Amer: >60
Glucose, Bld: 105 — ABNORMAL HIGH
Glucose, Bld: 111 — ABNORMAL HIGH
Glucose, Bld: 139 — ABNORMAL HIGH
Potassium: 3.4 — ABNORMAL LOW
Potassium: 3.5
Potassium: 3.7
Sodium: 136
Sodium: 138
Sodium: 139

## 2011-03-03 LAB — CK TOTAL AND CKMB (NOT AT ARMC)
CK, MB: 1.2
Relative Index: 0.8
Total CK: 149

## 2011-03-03 LAB — COMPREHENSIVE METABOLIC PANEL
ALT: 13
Alkaline Phosphatase: 58
CO2: 23
GFR calc non Af Amer: >60
Glucose, Bld: 98
Potassium: 3.8
Sodium: 138
Total Bilirubin: 1.2

## 2011-03-03 LAB — URINE CULTURE: Colony Count: >=100000

## 2011-03-03 LAB — CBC
HCT: 39.6
HCT: 38.8 — ABNORMAL LOW
Hemoglobin: 13.8
Hemoglobin: 13.4
MCHC: 34.7
MCV: 91.2
Platelets: 178
RBC: 4.35
RBC: 4.24
RDW: 12.7
RDW: 12.7
WBC: 12.3 — ABNORMAL HIGH

## 2011-03-03 LAB — RAPID URINE DRUG SCREEN, HOSP PERFORMED
Amphetamines: NOT DETECTED
Barbiturates: NOT DETECTED
Benzodiazepines: NOT DETECTED
Cocaine: NOT DETECTED
Opiates: NOT DETECTED
Tetrahydrocannabinol: NOT DETECTED

## 2011-03-03 LAB — TROPONIN I: Troponin I: 0.03

## 2011-03-03 LAB — COMPREHENSIVE METABOLIC PANEL WITH GFR
AST: 20
Albumin: 3.7
BUN: 11
Calcium: 8.8
Chloride: 105
Creatinine, Ser: 0.72
GFR calc Af Amer: >60
Total Protein: 6.4

## 2011-03-03 LAB — LIPASE, BLOOD: Lipase: 21

## 2013-08-11 ENCOUNTER — Inpatient Hospital Stay (HOSPITAL_COMMUNITY)
Admission: EM | Admit: 2013-08-11 | Discharge: 2013-08-14 | DRG: 065 | Disposition: A | Discharge status: Rehabilitation Facility | Payer: Medicare Other | Attending: Internal Medicine | Admitting: Internal Medicine

## 2013-08-11 ENCOUNTER — Inpatient Hospital Stay (HOSPITAL_COMMUNITY): Payer: Medicare Other

## 2013-08-11 ENCOUNTER — Emergency Department (HOSPITAL_COMMUNITY): Payer: Medicare Other

## 2013-08-11 DIAGNOSIS — K5641 Fecal impaction: Secondary | ICD-10-CM | POA: Diagnosis present

## 2013-08-11 DIAGNOSIS — K567 Ileus, unspecified: Secondary | ICD-10-CM

## 2013-08-11 DIAGNOSIS — I6350 Cerebral infarction due to unspecified occlusion or stenosis of unspecified cerebral artery: Principal | ICD-10-CM | POA: Diagnosis present

## 2013-08-11 DIAGNOSIS — K560 Paralytic ileus: Secondary | ICD-10-CM | POA: Diagnosis present

## 2013-08-11 DIAGNOSIS — E039 Hypothyroidism, unspecified: Secondary | ICD-10-CM

## 2013-08-11 DIAGNOSIS — R29898 Other symptoms and signs involving the musculoskeletal system: Secondary | ICD-10-CM | POA: Diagnosis present

## 2013-08-11 DIAGNOSIS — R269 Unspecified abnormalities of gait and mobility: Secondary | ICD-10-CM | POA: Diagnosis present

## 2013-08-11 DIAGNOSIS — F200 Paranoid schizophrenia: Secondary | ICD-10-CM | POA: Diagnosis present

## 2013-08-11 DIAGNOSIS — R159 Full incontinence of feces: Secondary | ICD-10-CM | POA: Diagnosis present

## 2013-08-11 DIAGNOSIS — F79 Unspecified intellectual disabilities: Secondary | ICD-10-CM | POA: Diagnosis present

## 2013-08-11 DIAGNOSIS — I639 Cerebral infarction, unspecified: Secondary | ICD-10-CM

## 2013-08-11 HISTORY — DX: Hypothyroidism, unspecified: E03.9

## 2013-08-11 HISTORY — DX: Cerebral infarction, unspecified: I63.9

## 2013-08-11 LAB — RAPID URINE DRUG SCREEN, HOSP PERFORMED
Amphetamines: NOT DETECTED
BARBITURATES: NOT DETECTED
BENZODIAZEPINES: NOT DETECTED
COCAINE: NOT DETECTED
OPIATES: NOT DETECTED
Tetrahydrocannabinol: NOT DETECTED

## 2013-08-11 LAB — CBC
HEMATOCRIT: 40.6 % (ref 39.0–52.0)
Hemoglobin: 14.1 g/dL (ref 13.0–17.0)
MCH: 31.3 pg (ref 26.0–34.0)
MCHC: 34.7 g/dL (ref 30.0–36.0)
MCV: 90.2 fL (ref 78.0–100.0)
Platelets: 198 10*3/uL (ref 150–400)
RBC: 4.5 MIL/uL (ref 4.22–5.81)
RDW: 12.4 % (ref 11.5–15.5)
WBC: 6.5 10*3/uL (ref 4.0–10.5)

## 2013-08-11 LAB — COMPREHENSIVE METABOLIC PANEL
ALBUMIN: 3.8 g/dL (ref 3.5–5.2)
ALT: 7 U/L (ref 0–53)
AST: 14 U/L (ref 0–37)
Alkaline Phosphatase: 70 U/L (ref 39–117)
BUN: 18 mg/dL (ref 6–23)
CALCIUM: 9.3 mg/dL (ref 8.4–10.5)
CO2: 24 meq/L (ref 19–32)
CREATININE: 0.84 mg/dL (ref 0.50–1.35)
Chloride: 108 mEq/L (ref 96–112)
GFR calc Af Amer: >90 mL/min (ref >90)
GFR, EST NON AFRICAN AMERICAN: 85 mL/min — AB (ref >90)
Glucose, Bld: 108 mg/dL — ABNORMAL HIGH (ref 70–99)
Potassium: 4.4 mEq/L (ref 3.7–5.3)
Sodium: 143 mEq/L (ref 137–147)
Total Bilirubin: 0.3 mg/dL (ref 0.3–1.2)
Total Protein: 6.5 g/dL (ref 6.0–8.3)

## 2013-08-11 LAB — URINALYSIS, ROUTINE W REFLEX MICROSCOPIC
BILIRUBIN URINE: NEGATIVE
GLUCOSE, UA: NEGATIVE mg/dL
HGB URINE DIPSTICK: NEGATIVE
KETONES UR: NEGATIVE mg/dL
NITRITE: NEGATIVE
PH: 5.5 (ref 5.0–8.0)
Protein, ur: NEGATIVE mg/dL
Specific Gravity, Urine: 1.013 (ref 1.005–1.030)
Urobilinogen, UA: 0.2 mg/dL (ref 0.0–1.0)

## 2013-08-11 LAB — ETHANOL

## 2013-08-11 LAB — I-STAT TROPONIN, ED: TROPONIN I, POC: 0 ng/mL (ref 0.00–0.08)

## 2013-08-11 LAB — I-STAT CHEM 8, ED
BUN: 19 mg/dL (ref 6–23)
Calcium, Ion: 1.22 mmol/L (ref 1.13–1.30)
Chloride: 107 mEq/L (ref 96–112)
Creatinine, Ser: 1 mg/dL (ref 0.50–1.35)
Glucose, Bld: 108 mg/dL — ABNORMAL HIGH (ref 70–99)
HEMATOCRIT: 41 % (ref 39.0–52.0)
HEMOGLOBIN: 13.9 g/dL (ref 13.0–17.0)
Potassium: 4.2 mEq/L (ref 3.7–5.3)
SODIUM: 144 meq/L (ref 137–147)
TCO2: 24 mmol/L (ref 0–100)

## 2013-08-11 LAB — URINE MICROSCOPIC-ADD ON

## 2013-08-11 LAB — PROTIME-INR
INR: 1.17 (ref 0.00–1.49)
Prothrombin Time: 14.7 seconds (ref 11.6–15.2)

## 2013-08-11 LAB — APTT: aPTT: 43 seconds — ABNORMAL HIGH (ref 24–37)

## 2013-08-11 LAB — DIFFERENTIAL
BASOS ABS: 0 10*3/uL (ref 0.0–0.1)
BASOS PCT: 1 % (ref 0–1)
EOS PCT: 1 % (ref 0–5)
Eosinophils Absolute: 0.1 10*3/uL (ref 0.0–0.7)
Lymphocytes Relative: 17 % (ref 12–46)
Lymphs Abs: 1.1 10*3/uL (ref 0.7–4.0)
MONO ABS: 0.7 10*3/uL (ref 0.1–1.0)
Monocytes Relative: 10 % (ref 3–12)
Neutro Abs: 4.6 10*3/uL (ref 1.7–7.7)
Neutrophils Relative %: 71 % (ref 43–77)

## 2013-08-11 MED ORDER — ACETAMINOPHEN 325 MG PO TABS
650.0000 mg | ORAL_TABLET | ORAL | Status: DC | PRN
  Administered 2013-08-11: 650 mg via ORAL
  Filled 2013-08-11: qty 2

## 2013-08-11 MED ORDER — RISPERIDONE 1 MG PO TABS
1.0000 mg | ORAL_TABLET | Freq: Every morning | ORAL | Status: DC
  Administered 2013-08-12 – 2013-08-14 (×3): 1 mg via ORAL
  Filled 2013-08-11 (×3): qty 1

## 2013-08-11 MED ORDER — SENNOSIDES-DOCUSATE SODIUM 8.6-50 MG PO TABS
1.0000 | ORAL_TABLET | Freq: Every evening | ORAL | Status: DC | PRN
  Filled 2013-08-11: qty 1

## 2013-08-11 MED ORDER — ENOXAPARIN SODIUM 40 MG/0.4ML ~~LOC~~ SOLN
40.0000 mg | SUBCUTANEOUS | Status: DC
  Administered 2013-08-11 – 2013-08-13 (×3): 40 mg via SUBCUTANEOUS
  Filled 2013-08-11 (×4): qty 0.4

## 2013-08-11 MED ORDER — PAROXETINE HCL 20 MG PO TABS
20.0000 mg | ORAL_TABLET | Freq: Every day | ORAL | Status: DC
  Administered 2013-08-12 – 2013-08-14 (×3): 20 mg via ORAL
  Filled 2013-08-11 (×3): qty 1

## 2013-08-11 MED ORDER — SODIUM CHLORIDE 0.9 % IV SOLN
INTRAVENOUS | Status: AC
  Administered 2013-08-11: 1000 mL via INTRAVENOUS

## 2013-08-11 MED ORDER — LEVOTHYROXINE SODIUM 75 MCG PO TABS
75.0000 ug | ORAL_TABLET | Freq: Every day | ORAL | Status: DC
  Administered 2013-08-12 – 2013-08-14 (×3): 75 ug via ORAL
  Filled 2013-08-11 (×4): qty 1

## 2013-08-11 MED ORDER — GABAPENTIN 300 MG PO CAPS
600.0000 mg | ORAL_CAPSULE | Freq: Three times a day (TID) | ORAL | Status: DC
  Administered 2013-08-11 – 2013-08-14 (×9): 600 mg via ORAL
  Filled 2013-08-11 (×10): qty 2

## 2013-08-11 MED ORDER — RISPERIDONE 2 MG PO TABS
2.0000 mg | ORAL_TABLET | Freq: Every day | ORAL | Status: DC
  Administered 2013-08-11 – 2013-08-13 (×3): 2 mg via ORAL
  Filled 2013-08-11 (×4): qty 1

## 2013-08-11 MED ORDER — BENZTROPINE MESYLATE 0.5 MG PO TABS
0.5000 mg | ORAL_TABLET | Freq: Two times a day (BID) | ORAL | Status: DC
  Administered 2013-08-11 – 2013-08-14 (×6): 0.5 mg via ORAL
  Filled 2013-08-11 (×7): qty 1

## 2013-08-11 NOTE — ED Notes (Signed)
Patient transported to X-ray 

## 2013-08-11 NOTE — ED Notes (Signed)
Continuing to safety sit with patient.

## 2013-08-11 NOTE — ED Provider Notes (Signed)
CSN: [id number]     Arrival date & time 08/11/13  1254 History   First MD Initiated Contact with Patient 08/11/13 1255     Chief Complaint  Patient presents with  . right leg weakness    . Code Stroke     (Consider location/radiation/quality/duration/timing/severity/associated sxs/prior Treatment) HPI Comments: Patient is a 73 year old male with a past medical history of mental retardation and paranoid schizophrenia who presents to the emergency department via EMS from an adult a day care planing of a quick episode of right leg weakness. Per EMS, patient was walking around daycare when his right leg "gave out" causing him to fall, no head injury or trauma. On arrival to the emergency department, patient reports his leg is better. Caregiver from assisted living facility states he normally walks around on his own very well. He is at his baseline mentation, no recent fevers or illness. Patient denies any complaints at this time. Level V caveat due to mental retardation.  The history is provided by the patient, the EMS personnel and a caregiver.    No past medical history on file. No past surgical history on file. No family history on file. History  Substance Use Topics  . Smoking status: Not on file  . Smokeless tobacco: Not on file  . Alcohol Use: Not on file    Review of Systems  Unable to perform ROS: Other      Allergies  Review of patient's allergies indicates not on file.  Home Medications   Current Outpatient Rx  Name  Route  Sig  Dispense  Refill  . benztropine (COGENTIN) 0.5 MG tablet   Oral   Take 0.5 mg by mouth 2 (two) times daily.         Marland Kitchen. gabapentin (NEURONTIN) 300 MG capsule   Oral   Take 600 mg by mouth 3 (three) times daily.         Marland Kitchen. levothyroxine (SYNTHROID, LEVOTHROID) 75 MCG tablet   Oral   Take 75 mcg by mouth daily before breakfast.         . PARoxetine (PAXIL) 20 MG tablet   Oral   Take 20 mg by mouth daily.         . risperiDONE  (RISPERDAL) 1 MG tablet   Oral   Take 1 mg by mouth every morning.         . risperiDONE (RISPERDAL) 2 MG tablet   Oral   Take 2 mg by mouth at bedtime.          BP 127/94  Pulse 79  Temp(Src) 98.1 F (36.7 C) (Oral)  Resp 22  SpO2 93% Physical Exam  Nursing note and vitals reviewed. Constitutional: He is oriented to person, place, and time. He appears well-developed and well-nourished. No distress.  HENT:  Head: Normocephalic and atraumatic.  Mouth/Throat: Oropharynx is clear and moist.  Eyes: Conjunctivae and EOM are normal. Pupils are equal, round, and reactive to light.  Neck: Normal range of motion. Neck supple.  Cardiovascular: Normal rate, regular rhythm and normal heart sounds.   Pulmonary/Chest: Effort normal and breath sounds normal.  Abdominal: Soft. Bowel sounds are normal. There is no tenderness.  Musculoskeletal: Normal range of motion. He exhibits no edema and no tenderness.  Neurological: He is alert and oriented to person, place, and time. No cranial nerve deficit or sensory deficit. GCS eye subscore is 4. GCS verbal subscore is 5. GCS motor subscore is 6.  Strength upper extremities 5/5 and equal  bilateral. Slight weakness of right LE noted 4/5 compared to 5/5 on left LE. Sensation intact.  Skin: Skin is warm and dry. He is not diaphoretic.  Psychiatric: He has a normal mood and affect. He is slowed.    ED Course  Procedures (including critical care time) Labs Review Labs Reviewed  APTT - Abnormal; Notable for the following:    aPTT 43 (*)    All other components within normal limits  COMPREHENSIVE METABOLIC PANEL - Abnormal; Notable for the following:    Glucose, Bld 108 (*)    GFR calc non Af Amer 85 (*)    All other components within normal limits  I-STAT CHEM 8, ED - Abnormal; Notable for the following:    Glucose, Bld 108 (*)    All other components within normal limits  ETHANOL  PROTIME-INR  CBC  DIFFERENTIAL  URINE RAPID DRUG SCREEN  (HOSP PERFORMED)  URINALYSIS, ROUTINE W REFLEX MICROSCOPIC  I-STAT TROPOININ, ED  I-STAT TROPOININ, ED   Imaging Review Ct Head Wo Contrast  08/11/2013   CLINICAL DATA:  Right leg weakness.  EXAM: CT HEAD WITHOUT CONTRAST  TECHNIQUE: Contiguous axial images were obtained from the base of the skull through the vertex without intravenous contrast.  COMPARISON:  12/12/2007  FINDINGS: Area of low density noted within the left frontal lobe, likely subacute infarct. No hemorrhage. No other areas of cortical infarct. Chronic microvascular changes in the deep white matter. Diffuse cerebral atrophy. No hydrocephalus or midline shift.  No acute calvarial abnormality. Probable retention cyst in the right sphenoid sinus. Paranasal sinuses and mastoids otherwise clear. Orbital soft tissues unremarkable.  IMPRESSION: Area of low density in the left frontal lobe, favor subacute infarct.  Atrophy, chronic microvascular disease.  Critical Value/emergent results attempted to be called to Dr. Amada Jupiter beginning at 2:39 p.m. I have paged Dr. Amada Jupiter 3 times but never received a return call, the last attempt occurring at 3:04 p.m.   Electronically Signed   By: Charlett Nose M.D.   On: 08/11/2013 15:05   Mr Brain Wo Contrast  08/11/2013   CLINICAL DATA:  73 year old mental challenged patient with right lower extremity weakness. Code stroke. Previous left MCA territory infarcts.  EXAM: MRI HEAD WITHOUT CONTRAST  TECHNIQUE: Multiplanar, multiecho pulse sequences of the brain and surrounding structures were obtained without intravenous contrast.  COMPARISON:  Head CT without contrast 08/11/2013.  FINDINGS: By stroke team request only axial and coronal diffusion weighted imaging plus axial FLAIR was performed. Study is intermittently degraded by motion artifact.  Subtle areas of asymmetric increased trace diffusion with evidence of restricted diffusion at the left motor strip medial to the upper extremity representation, area  of trunk and proximal right lower extremity representation. See series 3, image 26). This corresponds to series 4, image 10 on coronal diffusion imaging, were the asymmetry is less conspicuous. No FLAIR hyperintensity in this area.  Chronic small to moderate size cortically based infarcts with encephalomalacia in the more anterior left frontal lobe, including pre motor and speech areas (series 300, image 22).  No midline shift, mass effect, or evidence of intracranial mass lesion. No ventriculomegaly.  Right sphenoid sinus mucous retention cyst.  IMPRESSION: Small areas of cortically based restricted diffusion suspected in the medial left motor strip, corresponding to right trunk and proximal right lower extremity representation.  Study reviewed in person with Dr. Ritta Slot on 08/11/2013 at 15:07 hours .   Electronically Signed   By: Augusto Gamble M.D.   On:  08/11/2013 15:16     EKG Interpretation   Date/Time:  Monday August 11 2013 12:55:54 EDT Ventricular Rate:  83 PR Interval:  161 QRS Duration: 90 QT Interval:  387 QTC Calculation: 455 R Axis:   53 Text Interpretation:  Sinus rhythm Consider left atrial enlargement  Abnormal R-wave progression, early transition Abnormal inferior Q waves No  significant change since last tracing Confirmed by [facility]  MD, Jonny Ruiz  [phone]) on 08/11/2013 1:21:38 PM      MDM   Final diagnoses:  CVA (cerebral vascular accident)   Pt presenting with right leg weakness. He is well appearing and in NAD, VSS. Exam difficult due to pt's mentation. At baseline per caregiver. Slight right lower extremity weakness noted. Pt unable to ambulate on right leg. Code stroke activated and pt evaluated by neurolo-hospitalist Dr. Amada Jupiter. CT head showing area of low density in the left frontal lobe, favoring subacute infarct. MRI ordered by neurology, results showing small areas of cortically based restricted diffusion suspected in the medial left motor strip, corresponding  to the right trunk and proximal lower extremity representation. Out of TPA window, pt will be admitted to medicine. Admission accepted by Dr. Elvera Lennox, [facility]. Case discussed with attending Dr. Fonnie Jarvis who also evaluated patient and agrees with plan of care.   Trevor Mace, PA-C 08/11/13 1605

## 2013-08-11 NOTE — ED Notes (Signed)
Attempted to call report. Was told that Charge RN was changing room assignment and would call back with new room.

## 2013-08-11 NOTE — ED Notes (Signed)
Safety Sitting with patient.

## 2013-08-11 NOTE — ED Notes (Signed)
To ED via [facility] from adult day care- with staff c/o right leg "not working right" -- hx of Mentally challenged/paranoid schizophrenia.

## 2013-08-11 NOTE — Consult Note (Signed)
Neurology Consultation Reason for Consult: Right leg weakness Referring Physician: Wayland SalinasJohn [name]  CC: Abnormal walking  History is obtained from: Caregivers  HPI: Scott [name] is a 73 y.o. male with a history of mental retardation who presents with sudden onset right leg weakness that started earlier today. He was seen normal around 11:30 AM, and shortly after that it was noticed that he stumbled and was not walking as well with his right side. He was therefore brought to the ER for evaluation where a code stroke was called.    LKW: 11:30 AM  tpa given?: no, After discussion with his sister and offering IV TPA, she decided not to pursue t-PA.     ROS: A 14 point ROS was performed and is negative except as noted in the HPI.  Past medical history Mental retardation Schizophrenia "prediabetes "  Family History: Unable to assess secondary to patient's altered mental status.    Social History: Tob: Unable to assess secondary to patient's altered mental status.    Exam: Current vital signs: BP 147/81  Pulse 75  Temp(Src) 98.1 F (36.7 C) (Oral)  Resp 24  SpO2 94% Vital signs in last 24 hours: Temp:  [97.7 F (36.5 C)-98.1 F (36.7 C)] 98.1 F (36.7 C) (03/23 1535) Pulse Rate:  [73-100] 75 (03/23 1707) Resp:  [17-27] 24 (03/23 1707) BP: (127-157)/(76-94) 147/81 mmHg (03/23 1707) SpO2:  [92 %-98 %] 94 % (03/23 1630)  Exam is limited due to patient agitation and poor ability to cooperate General: in bed, NAD CV: RRR Mental Status: Patient is awake, alert, oriented to person, repeatedly asks to go home. Mental retardation is present.  No signs of aphasia or neglect Cranial Nerves: II: Visual Fields are full. Pupils are equal, round, and reactive to light.  Discs are difficult to visualize. III,IV, VI: EOMI without ptosis or diploplia.  V: Facial sensation is symmetric to temperature VII: Facial movement is symmetric.  VIII: hearing is intact to voice X: Uvula  elevates symmetrically XI: Shoulder shrug is symmetric. XII: tongue is midline without atrophy or fasciculations.  Motor: Tone is normal. Bulk is normal. 5/5 strength was present in arms and left leg. He has a mild weakness of his right lower extremity with difficulty disproportionate to his strength  Sensory: Sensation appears symmetric to light touch and temperature in the arms and legs. Though he is very inconsistent with his answers Deep Tendon Reflexes: 2+ and symmetric in the biceps and patellae.  Cerebellar: FNF and HKS are impairid bilaterally Gait: Patient drags right foot when attempting to walk.    I have reviewed labs in epic and the results pertinent to this consultation are: Cbc, inr unremarkabl.   I have reviewed the images obtained:MRI brain - there is restricted diffusion in the motor strip which would correspond well to his symptoms.   Impression: [age] yo M with with a history of previous strokes based on imaging who presents with sudden onset right leg weakness and has small area of ischemia on MRI. Given his mild symptoms, the pros and cons of TPA were discussed with his sister, and TPA was offered, but she felt with the risks she would favor not giving the medication at this time.  Recommendations: 1. HgbA1c, fasting lipid panel 2. MRI, MRA  of the brain without contrast 3. Frequent neuro checks 4. Echocardiogram 5. Carotid dopplers 6. Prophylactic therapy-Antiplatelet med: Aspirin - dose 325mg  po or 300mg  pr 7. Risk factor modification 8. Telemetry monitoring 9. PT consult,  OT consult, Speech consult    Ritta Slot, MD [facility] [phone]  If 7pm- 7am, please page neurology on call as listed in AMION.

## 2013-08-11 NOTE — H&P (Signed)
History and Physical    Scott [name]:[id number] DOB: 10-02-1940 DOA: 08/11/2013  Referring physician: Dr. Fonnie Jarvis PCP: No primary provider on file.  Specialists: neurology   Chief Complaint: right leg weakness  HPI: Scott [name] is a 73 y.o. male has a past medical history significant for paranoid schizophrenia, currently living in a group home, hypothyroidism, presents to the emergency room with a chief complaint of right leg weakness, at baseline he is able to walk without any problems, however today his right leg "gave out" causing him to fall. On arrival to the emergency room, stroke code was initiated and neurology evaluated the patient by the time I was asked for admission. Stat CT head with area of low density in the left frontal lobe, likely representing subacute infarct, stat MRI with cortically based restricted diffusion suspected in the medial left motor strip. Patient currently has no complaints and asks repeatedly when he will go home. He denies any current symptoms other than the right lower leg weakness, which he appreciates has improved. Specifically, he denies any chest pain, breathing difficulties, he denies any fever or chills, he denies any abdominal pain nausea vomiting or diarrhea.  Review of Systems: as per history of present illness, otherwise negative  Past medical history Paranoid schizophrenia MR Hypothyroidism  Social History: negative  Family history noncontributory  Prior to Admission medications   Medication Sig Start Date End Date Taking? Authorizing Provider  benztropine (COGENTIN) 0.5 MG tablet Take 0.5 mg by mouth 2 (two) times daily.   Yes Historical Provider, MD  gabapentin (NEURONTIN) 300 MG capsule Take 600 mg by mouth 3 (three) times daily.   Yes Historical Provider, MD  levothyroxine (SYNTHROID, LEVOTHROID) 75 MCG tablet Take 75 mcg by mouth daily before breakfast.   Yes Historical Provider, MD  PARoxetine (PAXIL) 20 MG tablet Take 20 mg by  mouth daily.   Yes Historical Provider, MD  risperiDONE (RISPERDAL) 1 MG tablet Take 1 mg by mouth every morning.   Yes Historical Provider, MD  risperiDONE (RISPERDAL) 2 MG tablet Take 2 mg by mouth at bedtime.   Yes Historical Provider, MD   Physical Exam: Filed Vitals:   08/11/13 1345 08/11/13 1400 08/11/13 1535 08/11/13 1600  BP: 144/90 157/76  127/94  Pulse:  100  79  Temp:   98.1 F (36.7 C)   TempSrc:      Resp:  27  22  SpO2:  97%  93%    General:  No apparent distress  Eyes: PERRL, EOMI, no scleral icterus  ENT: moist oropharynx  Neck: supple, no JVD  Cardiovascular: regular rate without MRG; 2+ peripheral pulses  Respiratory: CTA biL, good air movement without wheezing, rhonchi or crackled  Abdomen: soft, non tender to palpation, positive bowel sounds, no guarding, no rebound  Skin: no rashes  Musculoskeletal: no peripheral edema  Psychiatric: normal mood and affect  Neurologic: CN 2-12 grossly intact, MS 5/5 bilateral upper extremities and left lower extremity, 4/5 in the right lower extremity.  Labs on Admission:  Basic Metabolic Panel:  Recent Labs Lab 08/11/13 1428 08/11/13 1440  NA 143 144  K 4.4 4.2  CL 108 107  CO2 24  --   GLUCOSE 108* 108*  BUN 18 19  CREATININE 0.84 1.00  CALCIUM 9.3  --    Liver Function Tests:  Recent Labs Lab 08/11/13 1428  AST 14  ALT 7  ALKPHOS 70  BILITOT 0.3  PROT 6.5  ALBUMIN 3.8  CBC:  Recent Labs Lab 08/11/13 1428 08/11/13 1440  WBC 6.5  --   NEUTROABS 4.6  --   HGB 14.1 13.9  HCT 40.6 41.0  MCV 90.2  --   PLT 198  --    Radiological Exams on Admission: Ct Head Wo Contrast  08/11/2013   CLINICAL DATA:  Right leg weakness.  EXAM: CT HEAD WITHOUT CONTRAST  TECHNIQUE: Contiguous axial images were obtained from the base of the skull through the vertex without intravenous contrast.  COMPARISON:  12/12/2007  FINDINGS: Area of low density noted within the left frontal lobe, likely subacute  infarct. No hemorrhage. No other areas of cortical infarct. Chronic microvascular changes in the deep white matter. Diffuse cerebral atrophy. No hydrocephalus or midline shift.  No acute calvarial abnormality. Probable retention cyst in the right sphenoid sinus. Paranasal sinuses and mastoids otherwise clear. Orbital soft tissues unremarkable.  IMPRESSION: Area of low density in the left frontal lobe, favor subacute infarct.  Atrophy, chronic microvascular disease.  Critical Value/emergent results attempted to be called to Dr. Amada JupiterKirkpatrick beginning at 2:39 p.m. I have paged Dr. Amada JupiterKirkpatrick 3 times but never received a return call, the last attempt occurring at 3:04 p.m.   Electronically Signed   By: Charlett NoseKevin  Dover M.D.   On: 08/11/2013 15:05   Mr Brain Wo Contrast  08/11/2013   CLINICAL DATA:  73 year old mental challenged patient with right lower extremity weakness. Code stroke. Previous left MCA territory infarcts.  EXAM: MRI HEAD WITHOUT CONTRAST  TECHNIQUE: Multiplanar, multiecho pulse sequences of the brain and surrounding structures were obtained without intravenous contrast.  COMPARISON:  Head CT without contrast 08/11/2013.  FINDINGS: By stroke team request only axial and coronal diffusion weighted imaging plus axial FLAIR was performed. Study is intermittently degraded by motion artifact.  Subtle areas of asymmetric increased trace diffusion with evidence of restricted diffusion at the left motor strip medial to the upper extremity representation, area of trunk and proximal right lower extremity representation. See series 3, image 26). This corresponds to series 4, image 10 on coronal diffusion imaging, were the asymmetry is less conspicuous. No FLAIR hyperintensity in this area.  Chronic small to moderate size cortically based infarcts with encephalomalacia in the more anterior left frontal lobe, including pre motor and speech areas (series 300, image 22).  No midline shift, mass effect, or evidence of  intracranial mass lesion. No ventriculomegaly.  Right sphenoid sinus mucous retention cyst.  IMPRESSION: Small areas of cortically based restricted diffusion suspected in the medial left motor strip, corresponding to right trunk and proximal right lower extremity representation.  Study reviewed in person with Dr. Ritta SlotMcNeill Kirkpatrick on 08/11/2013 at 15:07 hours .   Electronically Signed   By: Augusto GambleLee  Hall M.D.   On: 08/11/2013 15:16    EKG: Independently reviewed. Sinus rhythm  Assessment/Plan Principal Problem:   CVA (cerebral infarction) Active Problems:   CVA (cerebral vascular accident)   Hypothyroidism   Paranoid schizophrenia    CVA - neurology consult in the emergency room. Appreciate input. Admit to telemetry, complete workup with 2-D echo, carotid Dopplers. N.p.o. Until swallow evaluation, obtain PT consult. Paranoid schizophrenia - continue home medications Hypothyroidism - continue Synthroid. Check TSH.    Diet: NPO Fluids: NS DVT Prophylaxis: Lovenox  Code Status: Full (presumed)  Family Communication: d/w caregivers from group home and sister Hilma FavorsSuzan [phone]) Disposition Plan: inpatient  Time spent: 1050  This note has been created with Education officer, environmentalDragon speech recognition software and smart phrase technology. Any  transcriptional errors are unintentional.   Tylesha Gibeault M. Elvera Lennox, MD [facility] Pager [phone]  If 7PM-7AM, please contact night-coverage [email or web address] Password [facility] 08/11/2013, 4:23 PM

## 2013-08-11 NOTE — Code Documentation (Addendum)
73 yo male with MR lives in group home and goes to adult day care, today was noted to fall at 1130 and have sudden onset of RLE. EMS was called and pt arrived at 1300. Sx were vague and difficult to determine in new focal sx existed.  EDP activated code stroke at 1407. Stroke team arrived and determined NIHSS to be 2: one for missing the month (baseline) and one for RLE drift which was very difficult to assess. We stood the pt up to see if he could walk and he clearly has more weakness in RLE than LLE. A STAT MRI was done which did reveal "Small areas of cortically based restricted diffusion suspected in the medial left motor strip, corresponding to right trunk and proximal right lower extremity representation." Dr. Amada JupiterKirkpatrick had extensive conversation with the pt's POA who chose not to have tPA administered. ED staff updated and handoff done.

## 2013-08-11 NOTE — ED Provider Notes (Signed)
Medical screening examination/treatment/procedure(s) were conducted as a shared visit with non-physician practitioner(s) and myself.  I personally evaluated the patient during the encounter.   EKG Interpretation   Date/Time:  Monday August 11 2013 12:55:54 EDT Ventricular Rate:  83 PR Interval:  161 QRS Duration: 90 QT Interval:  387 QTC Calculation: 455 R Axis:   53 Text Interpretation:  Sinus rhythm Consider left atrial enlargement  Abnormal R-wave progression, early transition Abnormal inferior Q waves No  significant change since last tracing Confirmed by [facility]  MD, Jonny RuizJOHN  [phone] on 08/11/2013 1:21:38 PM     Last known well 8:00 this morning, difficult historian due to intellectual disability, at baseline can walk normally according to his caretakers, sometime today he developed weakness to his right leg without numbness without trauma without pain, he denies headache he also denies weakness or numbness to his arms denies weakness or numbness to his left leg denies numbness to his right leg denies chest pain back pain abdominal pain neck pain. Examination reveals no pronator drift arms he has normal bilateral finger to nose testing no facial asymmetry extraocular movements intact peripheral visual fields full to confrontation normal tongue movements left leg is normal light touch with good strength against resistance right leg has normal light touch 4-5 strength against resistance patient also is unable to walk due to right leg weakness in the emergency department without pain without tenderness without back tenderness so code stroke activated by me.  D/w [facility] who deemed Pt not candidate for tPA.  Hurman HornJohn M Bryker Fletchall, MD 08/12/13 [phone]

## 2013-08-12 ENCOUNTER — Inpatient Hospital Stay (HOSPITAL_COMMUNITY): Payer: Medicare Other

## 2013-08-12 DIAGNOSIS — E039 Hypothyroidism, unspecified: Secondary | ICD-10-CM

## 2013-08-12 DIAGNOSIS — K560 Paralytic ileus: Secondary | ICD-10-CM

## 2013-08-12 DIAGNOSIS — K567 Ileus, unspecified: Secondary | ICD-10-CM | POA: Diagnosis present

## 2013-08-12 DIAGNOSIS — F200 Paranoid schizophrenia: Secondary | ICD-10-CM

## 2013-08-12 DIAGNOSIS — I369 Nonrheumatic tricuspid valve disorder, unspecified: Secondary | ICD-10-CM

## 2013-08-12 LAB — LIPID PANEL
Cholesterol: 126 mg/dL (ref 0–200)
HDL: 44 mg/dL (ref >39)
LDL CALC: 72 mg/dL (ref 0–99)
Total CHOL/HDL Ratio: 2.9 RATIO
Triglycerides: 48 mg/dL (ref <150)
VLDL: 10 mg/dL (ref 0–40)

## 2013-08-12 LAB — BASIC METABOLIC PANEL
BUN: 13 mg/dL (ref 6–23)
CALCIUM: 8.8 mg/dL (ref 8.4–10.5)
CO2: 22 meq/L (ref 19–32)
Chloride: 109 mEq/L (ref 96–112)
Creatinine, Ser: 0.67 mg/dL (ref 0.50–1.35)
GFR calc Af Amer: >90 mL/min (ref >90)
GLUCOSE: 99 mg/dL (ref 70–99)
Potassium: 3.9 mEq/L (ref 3.7–5.3)
Sodium: 144 mEq/L (ref 137–147)

## 2013-08-12 LAB — HEMOGLOBIN A1C
Hgb A1c MFr Bld: 5.9 % — ABNORMAL HIGH (ref <5.7)
Mean Plasma Glucose: 123 mg/dL — ABNORMAL HIGH (ref <117)

## 2013-08-12 LAB — CBC
HCT: 40.7 % (ref 39.0–52.0)
Hemoglobin: 14.1 g/dL (ref 13.0–17.0)
MCH: 31.1 pg (ref 26.0–34.0)
MCHC: 34.6 g/dL (ref 30.0–36.0)
MCV: 89.6 fL (ref 78.0–100.0)
Platelets: 198 10*3/uL (ref 150–400)
RBC: 4.54 MIL/uL (ref 4.22–5.81)
RDW: 12.4 % (ref 11.5–15.5)
WBC: 7.4 10*3/uL (ref 4.0–10.5)

## 2013-08-12 LAB — TSH: TSH: 0.411 u[IU]/mL (ref 0.350–4.500)

## 2013-08-12 MED ORDER — SIMVASTATIN 10 MG PO TABS
10.0000 mg | ORAL_TABLET | Freq: Every day | ORAL | Status: DC
  Administered 2013-08-13: 10 mg via ORAL
  Filled 2013-08-12 (×2): qty 1

## 2013-08-12 MED ORDER — ASPIRIN EC 325 MG PO TBEC
325.0000 mg | DELAYED_RELEASE_TABLET | Freq: Every day | ORAL | Status: DC
  Administered 2013-08-13 – 2013-08-14 (×2): 325 mg via ORAL
  Filled 2013-08-12 (×3): qty 1

## 2013-08-12 NOTE — Progress Notes (Signed)
Clinical Social Work Department CLINICAL SOCIAL WORK PLACEMENT NOTE 08/12/2013  Patient:  Scott [name],Scott [name]  Account Number:  [id number] Admit date:  08/11/2013  Clinical Social Worker:  Leron CroakASSANDRA Kynadee Dam, CLINICAL SOCIAL WORKER  Date/time:  08/12/2013 02:26 PM  Clinical Social Work is seeking post-discharge placement for this patient at the following level of care:   SKILLED NURSING   (*CSW will update this form in Epic as items are completed)   08/12/2013  Patient/family provided with [facility] Department of Clinical Social Work's list of facilities offering this level of care within the geographic area requested by the patient (or if unable, by the patient's family).  08/12/2013  Patient/family informed of their freedom to choose among providers that offer the needed level of care, that participate in Medicare, Medicaid or managed care program needed by the patient, have an available bed and are willing to accept the patient.  08/12/2013  Patient/family informed of [facility]' ownership interest in [facility], as well as of the fact that they are under no obligation to receive care at this facility.  PASARR submitted to EDS on 08/12/2013 PASARR number received from EDS on   FL2 transmitted to all facilities in geographic area requested by pt/family on  08/12/2013 FL2 transmitted to all facilities within larger geographic area on 08/12/2013  Patient informed that his/her managed care company has contracts with or will negotiate with  certain facilities, including the following:     Patient/family informed of bed offers received:   Patient chooses bed at  Physician recommends and patient chooses bed at    Patient to be transferred to  on   Patient to be transferred to facility by   The following physician request were entered in Epic:   Additional Comments:    Lenord Carboassandra Candia Kingsbury LCSWA  [facility]  4N 1[address]16;  6N1-16 Phone: [phone]

## 2013-08-12 NOTE — Progress Notes (Signed)
  Echocardiogram 2D Echocardiogram has been performed.  Farrel DemarkJill Eunice, RDMS, RVT  08/12/2013, 11:52 AM

## 2013-08-12 NOTE — Progress Notes (Signed)
Clinical Social Work Department BRIEF PSYCHOSOCIAL ASSESSMENT 08/12/2013  Patient:  Scott [name],Scott [name]     Account Number:  [id number]     Admit date:  08/11/2013  Clinical Social Worker:  Scott CroakALLEN,Scott [name], CLINICAL SOCIAL WORKER  Date/Time:  08/12/2013 11:18 AM  Referred by:  CSW  Date Referred:  08/12/2013 Referred for  SNF Placement   Other Referral:   Interview type:  Family Other interview type:   CSW spoke with sister    Scott [name]  Wk:  [phone]    PSYCHOSOCIAL DATA Living Status:  OTHER Admitted from facility:   Level of care:  Group Home Primary support name:  Scott [name] [phone] Primary support relationship to patient:  SIBLING Degree of support available:   Pt has a good support system from Group Home [facility]([place] [phone]) and from his siblings.    CURRENT CONCERNS Current Concerns  Post-Acute Placement   Other Concerns:    SOCIAL WORK ASSESSMENT / PLAN CSW spoke with the sister (HCPOA/Guardian Scott [name]) via phone to discuss possible need for SNF placement for rehabilitation. CSW introduced self and explained to Pt sister that PT had spoke with CSW informing CSW that the Pt was having difficulty with stabilization during ambulation and may need a higher level of care for rehab to gain more independence prior to d/c back to group home. Pt's sister was not aware that he may need rehab and expressed some concerns about the transition from inpt to SNF. CSW explained to the Pt's sister that CSW left a message with the group home administrator Scott [name](Scott [name] [phone]) to inquire if the group home would be able to provide a higher level of care for the Pt. CSW is awaiting a call back for the requested information. CSW explained to Pt's sister that CSW will update with d/c plan and Pt's sister was able to provide permission [place]. [place].   Assessment/plan status:  Information/Referral to  [facility] Other assessment/ plan:   Information/referral to community resources:   CSW will provide Pt's sister with a SNF listing in Pt's room for review.    PATIENT'S/FAMILY'S RESPONSE TO PLAN OF CARE: Pt's sister stated she wanted "to do what was best for the Pt and that if he needed rehab she would like to proceed with that option." Pt's sister was appreciative for assistance. CSW will continue to follow Pt for d/c planning to SNF vs Group home .        Scott Croakassandra Zacharee [name] [facility]  [facility]  [address] 1[address];  [phone] Phone: [phone]

## 2013-08-12 NOTE — Evaluation (Addendum)
Occupational Therapy Evaluation Patient Details Name: Scott FossaJohn G [name] MRN: [id number] DOB: 09/06/1940 Today's Date: 08/12/2013    History of Present Illness pt presents with gait disturbance and r/o CVA.     Clinical Impression   Pt presents with below problem list. Feel pt will benefit from acute OT to increase independence prior to d/c. Recommending CIR for additional rehab.     Follow Up Recommendations  CIR;Supervision/Assistance - 24 hour    Equipment Recommendations  Other (comment) (tbd at next venue)    Recommendations for Other Services Rehab consult     Precautions / Restrictions Precautions Precautions: Fall Restrictions Weight Bearing Restrictions: No      Mobility Bed Mobility Overal bed mobility: Needs Assistance Bed Mobility: Supine to Sit     Supine to sit: Supervision     General bed mobility comments: supervision for safety.   Transfers Overall transfer level: Needs assistance Equipment used: Rolling walker (2 wheeled) Transfers: Sit to/from Stand Sit to Stand: Min assist         General transfer comment: Cues for technique and hand placement (also assisted with hand placement).    Balance                                    ADL   Grooming: Wash/dry hands;Wash/dry face;Oral care;Moderate assistance;Standing     Lower Body Bathing: Sit to/from stand;Moderate assistance Lower Body Dressing: Moderate assistance;Sit to/from stand Toilet Transfer: Moderate assistance;Ambulation (chair) Toileting- Clothing Manipulation and Hygiene: Sit to/from stand;Maximal assistance   Functional mobility during ADLs: Moderate assistance;+2 for safety/equipment;Rolling walker General ADL Comments: Pt incontinent of bowel upon arrival. Assistance to perform hygiene thoroughly. Pt able to wash legs with cues and assistance. Pt able to don/doff socks. Performed grooming at sink. Pt ambulated in hallway with +2 Total A/Mod A (+2 for safety).       Vision                     Perception     Praxis      Pertinent Vitals/Pain No apparent distress.      Hand Dominance     Extremity/Trunk Assessment Upper Extremity Assessment Upper Extremity Assessment: Overall WFL for tasks assessed       Cervical / Trunk Assessment Cervical / Trunk Assessment: Kyphotic   Communication Communication Communication: No difficulties   Cognition Arousal/Alertness: Awake/alert Behavior During Therapy: Impulsive Overall Cognitive Status: No family/caregiver present to determine baseline cognitive functioning                     General Comments       Exercises      Home Living Family/patient expects to be discharged to:: Inpatient rehab                                 Additional Comments: pt from a group home, but question if staff will be able to care for pt at this level.        Prior Functioning/Environment          Comments: Spoke with pt's sister who states she thinks pt was capable of performing dressing/bathing, but unsure how much he did.    OT Diagnosis:     OT Problem List: Impaired balance (sitting and/or standing);Decreased cognition;Decreased safety awareness;Decreased knowledge of use of DME or AE;Decreased  knowledge of precautions;Decreased strength   OT Treatment/Interventions: Self-care/ADL training;DME and/or AE instruction;Therapeutic activities;Cognitive remediation/compensation;Visual/perceptual remediation/compensation;Patient/family education;Balance training    OT Goals(Current goals can be found in the care plan section) Acute Rehab OT Goals Patient Stated Goal: Back to Group Home OT Goal Formulation: Patient unable to participate in goal setting Time For Goal Achievement: 08/19/13 Potential to Achieve Goals: Good ADL Goals Pt Will Perform Upper Body Dressing: with supervision;sitting Pt Will Perform Lower Body Dressing: with supervision;sit to/from stand Pt Will  Transfer to Toilet: with supervision;ambulating Pt Will Perform Toileting - Clothing Manipulation and hygiene: with supervision;sit to/from stand  OT Frequency: Min 2X/week   Barriers to D/C:            End of Session: Equipment Utilized During Treatment: Gait belt;Rolling walker  Activity Tolerance: Patient tolerated treatment well Patient left: in chair;with call bell/phone within reach;with chair alarm set;with nursing/sitter in room   Time: 9604-5409 OT Time Calculation (min): 24 min Charges:  OT General Charges $OT Visit: 1 Procedure OT Evaluation $Initial OT Evaluation Tier I: 1 Procedure OT Treatments $Self Care/Home Management : 8-22 mins G-CodesEarlie Raveling OTR/L [phone] 08/12/2013, 6:46 PM

## 2013-08-12 NOTE — Progress Notes (Signed)
Stroke Team Progress Note  HISTORY Scott FossaJohn G [name] is a 73 y.o. male with a history of mental retardation who presents with sudden onset right leg weakness that started earlier today 08/11/2013. He was seen normal around 11:30 AM, and shortly after that it was noticed that he stumbled and was not walking as well with his right side. He was therefore brought to the ER for evaluation where a code stroke was called. Patient was not administerd TPA secondary to After discussion with his sister and offering IV TPA, she decided not to pursue t-PA. He was admitted for further evaluation and treatment.  SUBJECTIVE His nurse technician is at the bedside.  Overall he feels his condition is stable. He repeatedly asked, "when I can go back to the group home?".  OBJECTIVE Most recent Vital Signs: Filed Vitals:   08/12/13 0344 08/12/13 0556 08/12/13 0747 08/12/13 1047  BP: 140/62 138/53 132/55 138/85  Pulse: 84 88 84 80  Temp: 98.5 F (36.9 C) 97 F (36.1 C) 98.6 F (37 C) 98.1 F (36.7 C)  TempSrc: Oral Oral Oral Oral  Resp: 20  20 20   SpO2: 97% 96% 95% 92%   CBG (last 3)  No results found for this basename: GLUCAP,  in the last 72 hours  IV Fluid Intake:   . sodium chloride 1,000 mL (08/11/13 1725)    MEDICATIONS  . benztropine  0.5 mg Oral BID  . enoxaparin (LOVENOX) injection  40 mg Subcutaneous Q24H  . gabapentin  600 mg Oral TID  . levothyroxine  75 mcg Oral QAC breakfast  . PARoxetine  20 mg Oral Daily  . risperiDONE  1 mg Oral q morning - 10a  . risperiDONE  2 mg Oral QHS   PRN:  acetaminophen, senna-docusate  Diet:  Cardiac thin liquids Activity:  Bedrest, Up with assistance DVT Prophylaxis:  Lovenox 40 mg sq daily   CLINICALLY SIGNIFICANT STUDIES Basic Metabolic Panel:  Recent Labs Lab 08/11/13 1428 08/11/13 1440 08/12/13 0735  NA 143 144 144  K 4.4 4.2 3.9  CL 108 107 109  CO2 24  --  22  GLUCOSE 108* 108* 99  BUN 18 19 13   CREATININE 0.84 1.00 0.67  CALCIUM 9.3   --  8.8   Liver Function Tests:  Recent Labs Lab 08/11/13 1428  AST 14  ALT 7  ALKPHOS 70  BILITOT 0.3  PROT 6.5  ALBUMIN 3.8   CBC:  Recent Labs Lab 08/11/13 1428 08/11/13 1440 08/12/13 0735  WBC 6.5  --  7.4  NEUTROABS 4.6  --   --   HGB 14.1 13.9 14.1  HCT 40.6 41.0 40.7  MCV 90.2  --  89.6  PLT 198  --  198   Coagulation:  Recent Labs Lab 08/11/13 1428  LABPROT 14.7  INR 1.17   Cardiac Enzymes: No results found for this basename: CKTOTAL, CKMB, CKMBINDEX, TROPONINI,  in the last 168 hours Urinalysis:  Recent Labs Lab 08/11/13 1500  COLORURINE YELLOW  LABSPEC 1.013  PHURINE 5.5  GLUCOSEU NEGATIVE  HGBUR NEGATIVE  BILIRUBINUR NEGATIVE  KETONESUR NEGATIVE  PROTEINUR NEGATIVE  UROBILINOGEN 0.2  NITRITE NEGATIVE  LEUKOCYTESUR TRACE*   Lipid Panel    Component Value Date/Time   CHOL 126 08/12/2013 0735   TRIG 48 08/12/2013 0735   HDL 44 08/12/2013 0735   CHOLHDL 2.9 08/12/2013 0735   VLDL 10 08/12/2013 0735   LDLCALC 72 08/12/2013 0735   HgbA1C  No results found for this basename:  HGBA1C    Urine Drug Screen:     Component Value Date/Time   LABOPIA NONE DETECTED 08/11/2013 1500   COCAINSCRNUR NONE DETECTED 08/11/2013 1500   LABBENZ NONE DETECTED 08/11/2013 1500   AMPHETMU NONE DETECTED 08/11/2013 1500   THCU NONE DETECTED 08/11/2013 1500   LABBARB NONE DETECTED 08/11/2013 1500    Alcohol Level:  Recent Labs Lab 08/11/13 1428  ETH <11     Dg Hip Complete Right  08/11/2013   No definite acute bony findings.  Constipation and fecal impaction in the rectum.      CT of the brain  08/11/2013    Area of low density in the left frontal lobe, favor subacute infarct.  Atrophy, chronic microvascular disease.    MRI of the brain  08/11/2013    Small areas of cortically based restricted diffusion suspected in the medial left motor strip, corresponding to right trunk and proximal right lower extremity representation.  MRA of the brain    2D Echocardiogram     Carotid Doppler    CXR  08/11/2013    No acute cardiopulmonary findings. Low lung volumes with vascular crowding and atelectasis.  Colonic interposition and dilated colon likely due to colonic ileus.      EKG  normal sinus rhythm. For complete results please see formal report.   Therapy Recommendations CIR  Physical Exam   Elderly mentally retarded Caucasian male not in distress.Awake alert. Afebrile. Head is nontraumatic. Neck is supple without bruit. Hearing is normal. Cardiac exam no murmur or gallop. Lungs are clear to auscultation. Distal pulses are well felt. Neurological Exam ;  Awake  Alert oriented x 2. Pressured speech and palilalia and repitition of thoughts. Follows one step commands only. Diminished attention and recall..eye movements full without nystagmus.fundi were not visualized. Vision acuity and fields appear normal. Hearing is normal. Palatal movements are normal. Face symmetric. Tongue midline. Normal strength, tone, reflexes and coordination. Normal sensation. Gait deferred. ASSESSMENT [name] is a 73 y.o. male presenting with abnormal gait. 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 Workup underway.  On no antithrombotics prior to admission. Now on no antithrombotics for secondary stroke prevention. Patient with resultant Gait abnormality. Stroke work up underway.   Mental retardation  Schizophrenia  LDL 72  Hospital day # 1  TREATMENT/PLAN  Add aspirin 325 mg orally every day for secondary stroke prevention.  Follow 2-D echocardiogram and carotid Doppler  Rehabilitation consult  Given mental retardation and schizophrenia, we'll not pursue further embolic workup  Annie Main, MSN, RN, ANVP-BC, AGPCNP-BC [facility] Pager: [phone] 08/12/2013 12:12 PM  I have personally obtained a history, examined the patient, evaluated imaging results, and formulated the  assessment and plan of care. I agree with the above.  Delia Heady, MD  To contact Stroke Continuity provider, please refer to [email or web address]. After hours, contact General Neurology

## 2013-08-12 NOTE — Progress Notes (Signed)
OT Cancellation Note  Patient Details Name: Marya FossaJohn G Valcarcel MRN: [id number] DOB: 07/13/1940   Cancelled Treatment:    Reason Eval/Treat Not Completed: Patient at procedure or test/ unavailable  Earlie RavelingStraub, Blas Riches L OTR/L [phone] 08/12/2013, 11:55 AM

## 2013-08-12 NOTE — Progress Notes (Signed)
UR complete.  Margerite Impastato RN, MSN 

## 2013-08-12 NOTE — Progress Notes (Signed)
Rehab Admissions Coordinator Note:  Patient was screened by Clois DupesBoyette, Sailor Haughn Godwin for appropriateness for an Inpatient Acute Rehab Consult.  At this time, we are recommending Inpatient Rehab consult. Please place order.  Clois DupesBoyette, Latrelle Bazar Godwin 08/12/2013, 1:22 PM  I can be reached at [phone].

## 2013-08-12 NOTE — Progress Notes (Signed)
VASCULAR LAB PRELIMINARY  PRELIMINARY  PRELIMINARY  PRELIMINARY  Carotid duplex completed.    Preliminary report:  Bilateral:  1-39% ICA stenosis left greater than right.  Vertebral artery flow is antegrade.     Andrius Andrepont, RVS 08/12/2013, 12:53 PM

## 2013-08-12 NOTE — Progress Notes (Addendum)
08/11/13 2053  What Happened  Was fall witnessed? No  Was patient injured? No  Patient found on floor  Found by Staff-comment  Stated prior activity other (comment) (pt keeps saying he wants to go home)  Follow Up  MD notified Steve RattlerKaren Kirby-Mid level on call  Time MD notified 2115  Family notified Yes-comment (notified group home adm., sister not answering phone)  Time family notified 2120  Additional tests No  Simple treatment Other (comment) (none)  Fall Risk Assessment  Risk Factor Category (scoring not indicated) High fall risk per protocol (document High fall risk)  Patient's Fall Risk High Fall Risk (>13 points)  Fall Risk Interventions  Required Bundle Interventions *See Row Information* High fall risk - low, moderate, and high requirements implemented  Additional Interventions Fall risk signage;Individualized elimination schedule;Reorient/diversional activities with confused patients;Room near nurses station;24 hour supervision/sitter

## 2013-08-12 NOTE — Evaluation (Signed)
Physical Therapy Evaluation Patient Details Name: Scott FossaJohn G [name] MRN: [id number] DOB: 12/21/1940 Today's Date: 08/12/2013   History of Present Illness  pt presents with gait disturbance and r/o CVA.    Clinical Impression  Pt unsteady and impulsive with mobility.  Pt needs 2nd person for OOB mobility 2/2 needing two hands to A pt and sitter managed IV pole.  Pt tends to lean R and anteriorly during ambulation and does agree that he is off balance.  Concerned that Group Home would not be able to provide enough A for pt at this time and [facility] give pt best chance at returning to his Group Home.  Will continue to follow.      Follow Up Recommendations CIR    Equipment Recommendations  Rolling walker with 5" wheels    Recommendations for Other Services       Precautions / Restrictions Precautions Precautions: Fall Restrictions Weight Bearing Restrictions: No      Mobility  Bed Mobility Overal bed mobility: Needs Assistance Bed Mobility: Supine to Sit     Supine to sit: Supervision     General bed mobility comments: pt impulsive and moves quickly.  Transfers Overall transfer level: Needs assistance Equipment used: Rolling walker (2 wheeled) Transfers: Sit to/from Stand Sit to Stand: Min assist         General transfer comment: A for balance and cueing for safe technique.    Ambulation/Gait Ambulation/Gait assistance: Mod assist Ambulation Distance (Feet): 200 Feet (x2) Assistive device: Rolling walker (2 wheeled) Gait Pattern/deviations: Step-through pattern;Decreased stride length;Scissoring;Ataxic;Trunk flexed;Narrow base of support Gait velocity: Fluctuating speed   General Gait Details: pt unsteady and requires 2nd person to manage IV pole as 2 hands needed to A pt and maintain balance.  pt with anterior and R lateral lean, A with management of RW, and maximal cueing for safety.    Stairs            Wheelchair Mobility     Modified Rankin (Stroke Patients Only) Modified Rankin (Stroke Patients Only) Pre-Morbid Rankin Score: Moderate disability Modified Rankin: Moderately severe disability     Balance Overall balance assessment: Needs assistance         Standing balance support: Bilateral upper extremity supported Standing balance-Leahy Scale: Zero                       Pertinent Vitals/Pain Denied pain.      Home Living Family/patient expects to be discharged to:: Inpatient rehab                 Additional Comments: pt from a group home, but question if staff will be able to care for pt at this level.      Prior Function           Comments: Unsure.  pt states he ambulated and could perform ADLs.       Hand Dominance        Extremity/Trunk Assessment   Upper Extremity Assessment: Defer to OT evaluation           Lower Extremity Assessment: Generalized weakness;RLE deficits/detail RLE Deficits / Details: Difficult to assess 2/2 cognitive deficits, but seems weaker functionally than L.      Cervical / Trunk Assessment: Kyphotic  Communication   Communication: No difficulties  Cognition Arousal/Alertness: Awake/alert Behavior During Therapy: Impulsive Overall Cognitive Status: No family/caregiver present to determine baseline cognitive functioning  General Comments      Exercises        Assessment/Plan    PT Assessment Patient needs continued PT services  PT Diagnosis Difficulty walking   PT Problem List Decreased strength;Decreased activity tolerance;Decreased balance;Decreased mobility;Decreased coordination;Decreased cognition;Decreased knowledge of use of DME;Decreased safety awareness  PT Treatment Interventions DME instruction;Gait training;Stair training;Functional mobility training;Therapeutic activities;Therapeutic exercise;Balance training;Neuromuscular re-education;Patient/family education;Cognitive  remediation   PT Goals (Current goals can be found in the Care Plan section) Acute Rehab PT Goals Patient Stated Goal: Back to Group Home PT Goal Formulation: Patient unable to participate in goal setting Time For Goal Achievement: 08/26/13 Potential to Achieve Goals: Good    Frequency Min 4X/week   Barriers to discharge Decreased caregiver support Unsure if Group Home can provide enough A.      End of Session Equipment Utilized During Treatment: Gait belt Activity Tolerance: Patient tolerated treatment well Patient left: in bed;with nursing/sitter in room (with transport for Vascular)         Time: 0981-1914 PT Time Calculation (min): 38 min   Charges:   PT Evaluation $Initial PT Evaluation Tier I: 1 Procedure PT Treatments $Gait Training: 23-37 mins $Therapeutic Activity: 8-22 mins   PT G CodesSunny [name], Scott [name] [phone] 08/12/2013, 12:06 PM

## 2013-08-12 NOTE — Progress Notes (Signed)
[facility] PROGRESS NOTE  Scott [name]:[id number] DOB: [birth year] DOA: 08/11/2013 PCP: No primary provider on file.  Interim history 73 y.o. male has a past medical history significant for mental retardation, paranoid schizophrenia, currently living in a group home, hypothyroidism, presents to the emergency room with a chief complaint of right leg weakness.  At baseline he is able to walk without any problems, however on the day of admission, his right leg "gave out" causing him to fall. On arrival to the emergency room, stroke code was initiated and neurology evaluated the patient. The patient is a poor historian.. Workup was undertaken. MRI of the brain showed an acute stroke in the left motor strip as well as an old stroke in the left frontal lobe.  Assessment/Plan: Acute ischemic stroke -MRI brain--acute infarction left motor strip -Patient was not on any of the therapy prior to admission -Carotid ultrasound negative for hemodynamically significant stenosis -Echocardiogram shows EF 55-60%, grade 1 diastolic dysfunction, no thrombi -Given the patient's underlying psychiatric diagnoses, neurology did not recommend pursuing further embolic workup -LDL 72 -Hemoglobin A1c 5.9 -consulted CIR -start low dose statin Colonic ileus/stool impaction -Patient had bowel movements today -Tolerating diet -Repeat abdominal x-ray in the morning -TSH 0.411 -Sister states that patient has a tendency to "hold in" stool Paranoid schizophrenia -Continue Cogentin and Risperdal -Continue Paxil Hypothyroidism -Continue Synthroid  Family Communication:   sister at beside Disposition Plan:   CIR vs back to group home        Procedures/Studies: Dg Chest 2 View  08/11/2013   CLINICAL DATA:  Weakness.  CVA.  EXAM: CHEST  2 VIEW  COMPARISON:  12/22/2007  FINDINGS: The heart is within normal limits in size. There is tortuosity and calcification of the thoracic aorta. Low lung volumes with  vascular crowding and bibasilar atelectasis. No definite infiltrates or effusions.  Colonic interposition with marked dilatation likely due to colonic ileus/inertia.  IMPRESSION: No acute cardiopulmonary findings. Low lung volumes with vascular crowding and atelectasis.  Colonic interposition and dilated colon likely due to colonic ileus.   Electronically Signed   By: Loralie Champagne M.D.   On: 08/11/2013 17:17   Dg Hip Complete Right  08/11/2013   CLINICAL DATA:  Hip pain.  EXAM: RIGHT HIP - COMPLETE 2+ VIEW  COMPARISON:  None.  FINDINGS: Both hips are normally located. No significant degenerative changes. No fracture or plain film evidence of avascular necrosis. The pubic symphysis and SI joints are intact. There is a large amount of stool in the colon and fecal impaction in the rectum.  IMPRESSION: No definite acute bony findings.  Constipation and fecal impaction in the rectum.   Electronically Signed   By: Loralie Champagne M.D.   On: 08/11/2013 17:23   Ct Head Wo Contrast  08/11/2013   CLINICAL DATA:  Right leg weakness.  EXAM: CT HEAD WITHOUT CONTRAST  TECHNIQUE: Contiguous axial images were obtained from the base of the skull through the vertex without intravenous contrast.  COMPARISON:  12/12/2007  FINDINGS: Area of low density noted within the left frontal lobe, likely subacute infarct. No hemorrhage. No other areas of cortical infarct. Chronic microvascular changes in the deep white matter. Diffuse cerebral atrophy. No hydrocephalus or midline shift.  No acute calvarial abnormality. Probable retention cyst in the right sphenoid sinus. Paranasal sinuses and mastoids otherwise clear. Orbital soft tissues unremarkable.  IMPRESSION: Area of low density in the left frontal lobe, favor subacute infarct.  Atrophy, chronic microvascular disease.  Critical  Value/emergent results attempted to be called to Dr. Amada JupiterKirkpatrick beginning at 2:39 p.m. I have paged Dr. Amada JupiterKirkpatrick 3 times but never received a return  call, the last attempt occurring at 3:04 p.m.   Electronically Signed   By: Charlett NoseKevin  Dover M.D.   On: 08/11/2013 15:05   Mr Brain Wo Contrast  08/11/2013   CLINICAL DATA:  73 year old mental challenged patient with right lower extremity weakness. Code stroke. Previous left MCA territory infarcts.  EXAM: MRI HEAD WITHOUT CONTRAST  TECHNIQUE: Multiplanar, multiecho pulse sequences of the brain and surrounding structures were obtained without intravenous contrast.  COMPARISON:  Head CT without contrast 08/11/2013.  FINDINGS: By stroke team request only axial and coronal diffusion weighted imaging plus axial FLAIR was performed. Study is intermittently degraded by motion artifact.  Subtle areas of asymmetric increased trace diffusion with evidence of restricted diffusion at the left motor strip medial to the upper extremity representation, area of trunk and proximal right lower extremity representation. See series 3, image 26). This corresponds to series 4, image 10 on coronal diffusion imaging, were the asymmetry is less conspicuous. No FLAIR hyperintensity in this area.  Chronic small to moderate size cortically based infarcts with encephalomalacia in the more anterior left frontal lobe, including pre motor and speech areas (series 300, image 22).  No midline shift, mass effect, or evidence of intracranial mass lesion. No ventriculomegaly.  Right sphenoid sinus mucous retention cyst.  IMPRESSION: Small areas of cortically based restricted diffusion suspected in the medial left motor strip, corresponding to right trunk and proximal right lower extremity representation.  Study reviewed in person with Dr. Ritta SlotMcNeill Kirkpatrick on 08/11/2013 at 15:07 hours .   Electronically Signed   By: Augusto GambleLee  Hall M.D.   On: 08/11/2013 15:16         Subjective: Patient denies any fevers, chills, chest pain, shortness breath, nausea, vomiting, diarrhea, abdominal pain. No dysuria or hematuria.  Objective: Filed Vitals:    08/12/13 0747 08/12/13 1047 08/12/13 1401 08/12/13 1739  BP: 132/55 138/85 115/71 150/80  Pulse: 84 80 77 86  Temp: 98.6 F (37 C) 98.1 F (36.7 C) 97.3 F (36.3 C) 98.3 F (36.8 C)  TempSrc: Oral Oral Oral Oral  Resp: 20 20 20 20   SpO2: 95% 92% 95% 93%    Intake/Output Summary (Last 24 hours) at 08/12/13 1914 Last data filed at 08/12/13 1700  Gross per 24 hour  Intake   1400 ml  Output    200 ml  Net   1200 ml   Weight change:  Exam:   General:  Pt is alert, follows commands appropriately, not in acute distress  HEENT: No icterus, No thrush, [place]/AT  Cardiovascular: RRR, S1/S2, no rubs, no gallops  Respiratory: CTA bilaterally, no wheezing, no crackles, no rhonchi  Abdomen: Soft/+BS, non tender, non distended, no guarding  Extremities: No edema, No lymphangitis, No petechiae, No rashes, no synovitis  Data Reviewed: Basic Metabolic Panel:  Recent Labs Lab 08/11/13 1428 08/11/13 1440 08/12/13 0735  NA 143 144 144  K 4.4 4.2 3.9  CL 108 107 109  CO2 24  --  22  GLUCOSE 108* 108* 99  BUN 18 19 13   CREATININE 0.84 1.00 0.67  CALCIUM 9.3  --  8.8   Liver Function Tests:  Recent Labs Lab 08/11/13 1428  AST 14  ALT 7  ALKPHOS 70  BILITOT 0.3  PROT 6.5  ALBUMIN 3.8   No results found for this basename: LIPASE, AMYLASE,  in  the last 168 hours No results found for this basename: AMMONIA,  in the last 168 hours CBC:  Recent Labs Lab 08/11/13 1428 08/11/13 1440 08/12/13 0735  WBC 6.5  --  7.4  NEUTROABS 4.6  --   --   HGB 14.1 13.9 14.1  HCT 40.6 41.0 40.7  MCV 90.2  --  89.6  PLT 198  --  198   Cardiac Enzymes: No results found for this basename: CKTOTAL, CKMB, CKMBINDEX, TROPONINI,  in the last 168 hours BNP: No components found with this basename: POCBNP,  CBG: No results found for this basename: GLUCAP,  in the last 168 hours  No results found for this or any previous visit (from the past 240 hour(s)).   Scheduled Meds: . aspirin EC   325 mg Oral Daily  . benztropine  0.5 mg Oral BID  . enoxaparin (LOVENOX) injection  40 mg Subcutaneous Q24H  . gabapentin  600 mg Oral TID  . levothyroxine  75 mcg Oral QAC breakfast  . PARoxetine  20 mg Oral Daily  . risperiDONE  1 mg Oral q morning - 10a  . risperiDONE  2 mg Oral QHS   Continuous Infusions:    Kejuan Bekker, DO  [facility] Pager [phone]  If 7PM-7AM, please contact night-coverage [email or web address] Password [email or web address] 08/12/2013, 7:14 PM   LOS: 1 day

## 2013-08-13 DIAGNOSIS — I6330 Cerebral infarction due to thrombosis of unspecified cerebral artery: Secondary | ICD-10-CM

## 2013-08-13 LAB — BASIC METABOLIC PANEL WITH GFR
BUN: 12 mg/dL (ref 6–23)
CO2: 21 meq/L (ref 19–32)
Calcium: 9 mg/dL (ref 8.4–10.5)
Chloride: 108 meq/L (ref 96–112)
Creatinine, Ser: 0.68 mg/dL (ref 0.50–1.35)
GFR calc Af Amer: >90 mL/min (ref >90)
GFR calc non Af Amer: >90 mL/min (ref >90)
Glucose, Bld: 95 mg/dL (ref 70–99)
Potassium: 3.6 meq/L — ABNORMAL LOW (ref 3.7–5.3)
Sodium: 143 meq/L (ref 137–147)

## 2013-08-13 NOTE — Evaluation (Signed)
Speech Language Pathology Evaluation Patient Details Name: Scott FossaJohn G [name] MRN: [id number] DOB: 05/23/1940 Today's Date: 08/13/2013 Time: [id number] SLP Time Calculation (min): 30 min  Problem List:  Patient Active Problem List   Diagnosis Date Noted  . Ileus 08/12/2013  . CVA (cerebral vascular accident) 08/11/2013  . CVA (cerebral infarction) 08/11/2013  . Hypothyroidism 08/11/2013  . Paranoid schizophrenia 08/11/2013   Past Medical History: No past medical history on file. Past Surgical History: No past surgical history on file. HPI:  73 year old male admitted 08/11/13 due to sudden onset of right lower extremity weakness.  MRI revealed acute stroke in the left motor strip, and an old infarct in the left frontal lobe. PMH significant for paranoid schzophrenia, Mental Retardation.   Assessment / Plan / Recommendation Clinical Impression  Pt has baseline cognitive deficits at baseline.  No family is present to discuss if pt's current status is the same or different.  Pt is perseverative, knows where he is, but is not oriented to time.  It is difficult to assess, given perseveration on wanting to get dressed and go back to his group home.  When asked to complete certain cognitive tasks with SLP, pt responded "I don't want to do that today".    SLP Assessment  All further Speech Lanaguage Pathology  needs can be addressed in the next venue of care    Follow Up Recommendations  24 hour supervision/assistance    Frequency and Duration        Pertinent Vitals/Pain VSS, no pain reported   SLP Goals   Need to identify baseline level of cognitive function with family in order to make appropriate goals.  SLP Evaluation Prior Functioning  Cognitive/Linguistic Baseline: Baseline deficits Baseline deficit details: MR Type of Home: Group Home Education: Pt reports some school, unable to verbalize specifics. Vocation: Unemployed   Cognition  Overall Cognitive Status: History of  cognitive impairments - at baseline Arousal/Alertness: Awake/alert Orientation Level: Oriented to person;Oriented to place;Disoriented to situation;Disoriented to time (correct for birth month and date, not year of birth) Attention:  (Impaired on all levels) Memory: Impaired Awareness: Impaired Problem Solving: Impaired Behaviors: Restless;Impulsive;Verbal agitation;Poor frustration tolerance;Perseveration Safety/Judgment: Impaired Comments: Pt able to repeat 5 digits forward, recalls 3/5 words without delay.  Verbalized what he does at the group home - listen to radio, sit on the porch, make his bed, get dressed, "they cook for me".    Comprehension  Auditory Comprehension Overall Auditory Comprehension: Appears within functional limits for tasks assessed Visual Recognition/Discrimination Discrimination: Not tested Reading Comprehension Reading Status: Not tested    Expression Expression Primary Mode of Expression: Verbal   Oral / Motor Oral Motor/Sensory Function Overall Oral Motor/Sensory Function: Appears within functional limits for tasks assessed Motor Speech Overall Motor Speech: Appears within functional limits for tasks assessed   GO    Celia B. Murvin NatalBueche, Clark Memorial HospitalMSP, CCC-SLP [phone]  Leigh AuroraBueche, Celia Brown 08/13/2013, 3:03 PM

## 2013-08-13 NOTE — Progress Notes (Signed)
Pt remain at baseline, vital stable, no apparent injury from fall. Family members made aware. Sitter at bedside . Pt care endorsed to oncoming shift RN.

## 2013-08-13 NOTE — Progress Notes (Signed)
   CSW spoke with the Pt's guardian (Sister: Kennith GainSusan Lamb [phone]) to provide an update for d/c planning.   Pt's sister is aware that Pt has been recommended for CIR and would like to consider this option vs SNF due to Pt needing a more stable environment. Ms. Randa LynnLamb is aware that Pt has two bed offers and [facility] up [facility] for rehab.   CSW received a call back from Ms. [employer] technologist(Group Home Administrator) and they will be able to provide care for the Pt at d/c, however they "would just want to make sure the Pt is able to go to his day program." Home administrator stated that the Pt looks forward to that program.   CSW will continue to follow Pt and inform family of any changes in d/c plan.     Leron Croakassandra Brettney Ficken [facility]CS[facility]  [facility]  [address] 1[address];  [phone] Phone: [phone]

## 2013-08-13 NOTE — Consult Note (Signed)
Physical Medicine and Rehabilitation Consult Reason for Consult: CVA Referring Physician: [facility]   HPI: Scott [name] is a 73 y.o. right-handed male with history significant for paranoid schizophrenia/mental retardation and currently living in a group home. Presented 08/11/2013 with right-sided weakness. MRI of the brain shows small area of cortically based a restricted diffusion suspect it in the medial left motor strip. Carotid Dopplers with no ICA stenosis. Echocardiogram with ejection fraction of 60% grade 1 diastolic dysfunction. Neurology services followup workup ongoing. Patient did not receive TPA. Maintained on aspirin for CVA prophylaxis as well as subcutaneous Lovenox for DVT prophylaxis. Patient is tolerating a regular diet. Patient with a fall 08/12/2013 with a sitter at bedside for safety. Physical and occupational therapy evaluations completed 08/12/2013 with recommendations for physical medicine rehabilitation consult.  Review of Systems  Unable to perform ROS: mental acuity   No past medical history on file. No past surgical history on file. No family history on file. Social History:  has no tobacco, alcohol, and drug history on file. Allergies: Not on File Medications Prior to Admission  Medication Sig Dispense Refill  . benztropine (COGENTIN) 0.5 MG tablet Take 0.5 mg by mouth 2 (two) times daily.      Marland Kitchen gabapentin (NEURONTIN) 300 MG capsule Take 600 mg by mouth 3 (three) times daily.      Marland Kitchen levothyroxine (SYNTHROID, LEVOTHROID) 75 MCG tablet Take 75 mcg by mouth daily before breakfast.      . PARoxetine (PAXIL) 20 MG tablet Take 20 mg by mouth daily.      . risperiDONE (RISPERDAL) 1 MG tablet Take 1 mg by mouth every morning.      . risperiDONE (RISPERDAL) 2 MG tablet Take 2 mg by mouth at bedtime.        Home: Home Living Family/patient expects to be discharged to:: Inpatient rehab Additional Comments: pt from a group home, but question if staff will be able  to care for pt at this level.    Functional History: Prior Function Comments: Spoke with pt's sister who states she thinks pt was capable of performing dressing/bathing, but unsure how much he did. Functional Status:  Mobility: Bed Mobility Overal bed mobility: Needs Assistance Bed Mobility: Supine to Sit Supine to sit: Supervision General bed mobility comments: supervision for safety.  Transfers Overall transfer level: Needs assistance Equipment used: Rolling walker (2 wheeled) Transfers: Sit to/from Stand Sit to Stand: Min assist General transfer comment: Cues for technique and hand placement (also assisted with hand placement). Ambulation/Gait Ambulation/Gait assistance: Mod assist Ambulation Distance (Feet): 200 Feet (x2) Assistive device: Rolling walker (2 wheeled) Gait Pattern/deviations: Step-through pattern;Decreased stride length;Scissoring;Ataxic;Trunk flexed;Narrow base of support Gait velocity: Fluctuating speed General Gait Details: pt unsteady and requires 2nd person to manage IV pole as 2 hands needed to A pt and maintain balance.  pt with anterior and R lateral lean, A with management of RW, and maximal cueing for safety.      ADL: ADL Overall ADL's : Needs assistance/impaired Grooming: Wash/dry hands;Wash/dry face;Oral care;Moderate assistance;Standing;Maximal assistance Lower Body Bathing: Sit to/from stand;Moderate assistance Lower Body Dressing: Moderate assistance;Sit to/from stand Toilet Transfer: Moderate assistance;Ambulation (chair) Toileting- Clothing Manipulation and Hygiene: Sit to/from stand;Maximal assistance Functional mobility during ADLs: Moderate assistance;+2 for safety/equipment;Rolling walker General ADL Comments: Pt incontinent of bowel upon arrival. Assistance to perform hygiene thoroughly. Pt able to wash legs with cues and assistance. Pt able to don/doff socks. Pt ambulated in hallway and leaning to right side.  Cognition: Cognition Overall Cognitive Status: No family/caregiver present to determine baseline cognitive functioning Orientation Level: Oriented to person;Oriented to place;Disoriented to time;Disoriented to situation Cognition Arousal/Alertness: Awake/alert Behavior During Therapy: Impulsive Overall Cognitive Status: No family/caregiver present to determine baseline cognitive functioning  Blood pressure 137/80, pulse 85, temperature 97.7 F (36.5 C), temperature source Oral, resp. rate 20, SpO2 96.00%. Physical Exam  Vitals reviewed. HENT:  Head: Normocephalic.  Edentulous  Eyes: EOM are normal.  Neck: Normal range of motion. Neck supple. No thyromegaly present.  Cardiovascular: Normal rate and regular rhythm.   Respiratory: Effort normal and breath sounds normal. No respiratory distress.  GI: Soft. Bowel sounds are normal. He exhibits no distension.  Neurological: He is alert. He displays normal reflexes. He exhibits normal muscle tone.  Patient was able to provide his name. Continued to state he wanted to return back to group home  (highly perseverative). Told me name of his home. Able to identify several simple objects with extra time. Speech slurred but intelligible.. . Follows simple commands. Appeared to move all 4 fairly equally.  Sensation grossly normal.   Skin: Skin is warm and dry.  Psychiatric:  MR, perseverative, impulsive--- generally cooperative though    Results for orders placed during the hospital encounter of 08/11/13 (from the past 24 hour(s))  HEMOGLOBIN A1C     Status: Abnormal   Collection Time    08/12/13  7:35 AM      Result Value Ref Range   Hemoglobin A1C 5.9 (*) <5.7 %   Mean Plasma Glucose 123 (*) <117 mg/dL  CBC     Status: None   Collection Time    08/12/13  7:35 AM      Result Value Ref Range   WBC 7.4  4.0 - 10.5 K/uL   RBC 4.54  4.22 - 5.81 MIL/uL   Hemoglobin 14.1  13.0 - 17.0 g/dL   HCT 54.0  08.6 - 76.1 %   MCV 89.6  78.0 - 100.0  fL   MCH 31.1  26.0 - 34.0 pg   MCHC 34.6  30.0 - 36.0 g/dL   RDW 95.0  93.2 - 67.1 %   Platelets 198  150 - 400 K/uL  BASIC METABOLIC PANEL     Status: None   Collection Time    08/12/13  7:35 AM      Result Value Ref Range   Sodium 144  137 - 147 mEq/L   Potassium 3.9  3.7 - 5.3 mEq/L   Chloride 109  96 - 112 mEq/L   CO2 22  19 - 32 mEq/L   Glucose, Bld 99  70 - 99 mg/dL   BUN 13  6 - 23 mg/dL   Creatinine, Ser 2.45  0.50 - 1.35 mg/dL   Calcium 8.8  8.4 - 80.9 mg/dL   GFR calc non Af Amer >90  >90 mL/min   GFR calc Af Amer >90  >90 mL/min  LIPID PANEL     Status: None   Collection Time    08/12/13  7:35 AM      Result Value Ref Range   Cholesterol 126  0 - 200 mg/dL   Triglycerides 48  <983 mg/dL   HDL 44  >38 mg/dL   Total CHOL/HDL Ratio 2.9     VLDL 10  0 - 40 mg/dL   LDL Cholesterol 72  0 - 99 mg/dL  BASIC METABOLIC PANEL     Status: Abnormal   Collection Time  08/13/13  4:47 AM      Result Value Ref Range   Sodium 143  137 - 147 mEq/L   Potassium 3.6 (*) 3.7 - 5.3 mEq/L   Chloride 108  96 - 112 mEq/L   CO2 21  19 - 32 mEq/L   Glucose, Bld 95  70 - 99 mg/dL   BUN 12  6 - 23 mg/dL   Creatinine, Ser 1.610.68  0.50 - 1.35 mg/dL   Calcium 9.0  8.4 - 09.610.5 mg/dL   GFR calc non Af Amer >90  >90 mL/min   GFR calc Af Amer >90  >90 mL/min   Dg Chest 2 View  08/11/2013   CLINICAL DATA:  Weakness.  CVA.  EXAM: CHEST  2 VIEW  COMPARISON:  12/22/2007  FINDINGS: The heart is within normal limits in size. There is tortuosity and calcification of the thoracic aorta. Low lung volumes with vascular crowding and bibasilar atelectasis. No definite infiltrates or effusions.  Colonic interposition with marked dilatation likely due to colonic ileus/inertia.  IMPRESSION: No acute cardiopulmonary findings. Low lung volumes with vascular crowding and atelectasis.  Colonic interposition and dilated colon likely due to colonic ileus.   Electronically Signed   By: Loralie ChampagneMark  Gallerani M.D.   On:  08/11/2013 17:17   Dg Hip Complete Right  08/11/2013   CLINICAL DATA:  Hip pain.  EXAM: RIGHT HIP - COMPLETE 2+ VIEW  COMPARISON:  None.  FINDINGS: Both hips are normally located. No significant degenerative changes. No fracture or plain film evidence of avascular necrosis. The pubic symphysis and SI joints are intact. There is a large amount of stool in the colon and fecal impaction in the rectum.  IMPRESSION: No definite acute bony findings.  Constipation and fecal impaction in the rectum.   Electronically Signed   By: Loralie ChampagneMark  Gallerani M.D.   On: 08/11/2013 17:23   Ct Head Wo Contrast  08/11/2013   CLINICAL DATA:  Right leg weakness.  EXAM: CT HEAD WITHOUT CONTRAST  TECHNIQUE: Contiguous axial images were obtained from the base of the skull through the vertex without intravenous contrast.  COMPARISON:  12/12/2007  FINDINGS: Area of low density noted within the left frontal lobe, likely subacute infarct. No hemorrhage. No other areas of cortical infarct. Chronic microvascular changes in the deep white matter. Diffuse cerebral atrophy. No hydrocephalus or midline shift.  No acute calvarial abnormality. Probable retention cyst in the right sphenoid sinus. Paranasal sinuses and mastoids otherwise clear. Orbital soft tissues unremarkable.  IMPRESSION: Area of low density in the left frontal lobe, favor subacute infarct.  Atrophy, chronic microvascular disease.  Critical Value/emergent results attempted to be called to Dr. Amada JupiterKirkpatrick beginning at 2:39 p.m. I have paged Dr. Amada JupiterKirkpatrick 3 times but never received a return call, the last attempt occurring at 3:04 p.m.   Electronically Signed   By: Charlett NoseKevin  Dover M.D.   On: 08/11/2013 15:05   Mr Brain Wo Contrast  08/11/2013   CLINICAL DATA:  73 year old mental challenged patient with right lower extremity weakness. Code stroke. Previous left MCA territory infarcts.  EXAM: MRI HEAD WITHOUT CONTRAST  TECHNIQUE: Multiplanar, multiecho pulse sequences of the brain and  surrounding structures were obtained without intravenous contrast.  COMPARISON:  Head CT without contrast 08/11/2013.  FINDINGS: By stroke team request only axial and coronal diffusion weighted imaging plus axial FLAIR was performed. Study is intermittently degraded by motion artifact.  Subtle areas of asymmetric increased trace diffusion with evidence of restricted diffusion at the  left motor strip medial to the upper extremity representation, area of trunk and proximal right lower extremity representation. See series 3, image 26). This corresponds to series 4, image 10 on coronal diffusion imaging, were the asymmetry is less conspicuous. No FLAIR hyperintensity in this area.  Chronic small to moderate size cortically based infarcts with encephalomalacia in the more anterior left frontal lobe, including pre motor and speech areas (series 300, image 22).  No midline shift, mass effect, or evidence of intracranial mass lesion. No ventriculomegaly.  Right sphenoid sinus mucous retention cyst.  IMPRESSION: Small areas of cortically based restricted diffusion suspected in the medial left motor strip, corresponding to right trunk and proximal right lower extremity representation.  Study reviewed in person with Dr. Ritta Slot on 08/11/2013 at 15:07 hours .   Electronically Signed   By: Augusto Gamble M.D.   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 Does the need for close, 24 hr/day medical supervision in concert with the patient's rehab needs make it unreasonable for this patient to be served in a less intensive setting? Potentially 2. Co-Morbidities requiring supervision/potential complications: paranoid schizophrenia, MR 3. Due to bladder management, bowel management, safety, skin/wound care, disease management and medication administration, does the patient require 24 hr/day rehab nursing? Potentially 4. Does the patient require  coordinated care of a physician, rehab nurse, PT (1-2 hrs/day, 5 days/week), OT (1-2 hrs/day, 5 days/week) and SLP (1-2 hrs/day, 5 days/week) to address physical and functional deficits in the context of the above medical diagnosis(es)? Potentially Addressing deficits in the following areas: balance, endurance, locomotion, strength, transferring, bowel/bladder control, bathing, dressing, feeding, grooming, toileting, cognition and language 5. Can the patient actively participate in an intensive therapy program of at least 3 hrs of therapy per day at least 5 days per week? Potentially 6. The potential for patient to make measurable gains while on inpatient rehab is fair 7. Anticipated functional outcomes upon discharge from inpatient rehab are supervision  with PT, supervision with OT, supervision and min assist with SLP. 8. Estimated rehab length of stay to reach the above functional goals is: one week if needed 9. Does the patient have adequate social supports to accommodate these discharge functional goals? Potentially 10. Anticipated D/C setting: Home 11. Anticipated post D/C treatments: HH therapy 12. Overall Rehab/Functional Prognosis: good and fair  RECOMMENDATIONS: This patient's condition is appropriate for continued rehabilitative care in the following setting: HH therapies vs brief CIR admit. Patient has agreed to participate in recommended program. N/A Note that insurance prior authorization may be required for reimbursement for recommended care.  Comment: Need to get a feel for his baseline physically and medically. I suspect he is not far from that at this time. Transferred easily to chair per tech in the room this morning.  This is only hospital day #3 as well. Depending upon the the group home's ability to assist him, he may be able to go home with [facility] therapies. Rehab Admissions Coordinator to follow up.  Thanks,  Ranelle Oyster, MD, [place] [facility]     08/13/2013

## 2013-08-13 NOTE — Progress Notes (Signed)
Stroke Team Progress Note  HISTORY Scott [name] is a 73 y.o. male with a history of mental retardation who presents with sudden onset right leg weakness that started earlier today 08/11/2013. He was seen normal around 11:30 AM, and shortly after that it was noticed that he stumbled and was not walking as well with his right side. He was therefore brought to the ER for evaluation where a code stroke was called. Patient was not administerd TPA secondary to After discussion with his sister and offering IV TPA, she decided not to pursue t-PA. He was admitted for further evaluation and treatment.  SUBJECTIVE Sitter at bedside. Patient anxious to return to group home.   OBJECTIVE Most recent Vital Signs: Filed Vitals:   08/12/13 2124 08/13/13 0211 08/13/13 0642 08/13/13 0912  BP: 131/87 137/80 131/79 124/72  Pulse: 68 85 63 86  Temp: 98.1 F (36.7 C) 97.7 F (36.5 C) 98 F (36.7 C) 97.9 F (36.6 C)  TempSrc: Oral Oral Oral Oral  Resp: 20 20 20 20   SpO2: 97% 96% 98% 96%   CBG (last 3)  No results found for this basename: GLUCAP,  in the last 72 hours  IV Fluid Intake:      MEDICATIONS  . aspirin EC  325 mg Oral Daily  . benztropine  0.5 mg Oral BID  . enoxaparin (LOVENOX) injection  40 mg Subcutaneous Q24H  . gabapentin  600 mg Oral TID  . levothyroxine  75 mcg Oral QAC breakfast  . PARoxetine  20 mg Oral Daily  . risperiDONE  1 mg Oral q morning - 10a  . risperiDONE  2 mg Oral QHS  . simvastatin  10 mg Oral q1800   PRN:  acetaminophen, senna-docusate  Diet:  Cardiac thin liquids Activity:  Up with assistance DVT Prophylaxis:  Lovenox 40 mg sq daily   CLINICALLY SIGNIFICANT STUDIES Basic Metabolic Panel:   Recent Labs Lab 08/12/13 0735 08/13/13 0447  NA 144 143  K 3.9 3.6*  CL 109 108  CO2 22 21  GLUCOSE 99 95  BUN 13 12  CREATININE 0.67 0.68  CALCIUM 8.8 9.0   Liver Function Tests:   Recent Labs Lab 08/11/13 1428  AST 14  ALT 7  ALKPHOS 70  BILITOT 0.3   PROT 6.5  ALBUMIN 3.8   CBC:   Recent Labs Lab 08/11/13 1428 08/11/13 1440 08/12/13 0735  WBC 6.5  --  7.4  NEUTROABS 4.6  --   --   HGB 14.1 13.9 14.1  HCT 40.6 41.0 40.7  MCV 90.2  --  89.6  PLT 198  --  198   Coagulation:   Recent Labs Lab 08/11/13 1428  LABPROT 14.7  INR 1.17   Cardiac Enzymes: No results found for this basename: CKTOTAL, CKMB, CKMBINDEX, TROPONINI,  in the last 168 hours Urinalysis:   Recent Labs Lab 08/11/13 1500  COLORURINE YELLOW  LABSPEC 1.013  PHURINE 5.5  GLUCOSEU NEGATIVE  HGBUR NEGATIVE  BILIRUBINUR NEGATIVE  KETONESUR NEGATIVE  PROTEINUR NEGATIVE  UROBILINOGEN 0.2  NITRITE NEGATIVE  LEUKOCYTESUR TRACE*   Lipid Panel    Component Value Date/Time   CHOL 126 08/12/2013 0735   TRIG 48 08/12/2013 0735   HDL 44 08/12/2013 0735   CHOLHDL 2.9 08/12/2013 0735   VLDL 10 08/12/2013 0735   LDLCALC 72 08/12/2013 0735   HgbA1C  Lab Results  Component Value Date   HGBA1C 5.9* 08/12/2013    Urine Drug Screen:     Component  Value Date/Time   LABOPIA NONE DETECTED 08/11/2013 1500   COCAINSCRNUR NONE DETECTED 08/11/2013 1500   LABBENZ NONE DETECTED 08/11/2013 1500   AMPHETMU NONE DETECTED 08/11/2013 1500   THCU NONE DETECTED 08/11/2013 1500   LABBARB NONE DETECTED 08/11/2013 1500    Alcohol Level:   Recent Labs Lab 08/11/13 1428  ETH <11     Dg Hip Complete Right  08/11/2013   No definite acute bony findings.  Constipation and fecal impaction in the rectum.    DG Abd 2 View 08/13/2013 The bowel gas pattern suggests an fecal impaction with moderate stool burden more proximally in the colon. There is no high-grade obstructive pattern demonstrated. There is no evidence of free extraluminal gas.     CT of the brain  08/11/2013    Area of low density in the left frontal lobe, favor subacute infarct.  Atrophy, chronic microvascular disease.    MRI of the brain  08/11/2013    Small areas of cortically based restricted diffusion suspected in  the medial left motor strip, corresponding to right trunk and proximal right lower extremity representation.  2D Echocardiogram  EF 55-60% with no source of embolus.   Carotid Doppler  Bilateral: 1-39% ICA stenosis left greater than right. Vertebral artery flow is antegrade.   CXR  08/11/2013    No acute cardiopulmonary findings. Low lung volumes with vascular crowding and atelectasis.  Colonic interposition and dilated colon likely due to colonic ileus.     EKG  normal sinus rhythm. For complete results please see formal report.   Therapy Recommendations CIR  Physical Exam   Elderly mentally retarded Caucasian male not in distress.Awake alert. Afebrile. Head is nontraumatic. Neck is supple without bruit. Hearing is normal. Cardiac exam no murmur or gallop. Lungs are clear to auscultation. Distal pulses are well felt. Neurological Exam ;  Awake  Alert oriented x 2. Pressured speech and palilalia and repitition of thoughts. Follows one step commands only. Diminished attention and recall..eye movements full without nystagmus.fundi were not visualized. Vision acuity and fields appear normal. Hearing is normal. Palatal movements are normal. Face symmetric. Tongue midline. Normal strength, tone, reflexes and coordination. Normal sensation. Gait deferred.  ASSESSMENT Scott [name] is a 73 y.o. male presenting with abnormal gait. 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 Given mental retardation and schizophrenia, we'll not pursue further embolic workup. On no antithrombotics prior to admission. Now on aspirin 325 mg orally every day for secondary stroke prevention. Patient with resultant Gait abnormality. Stroke work up completed.   Mental retardation  Schizophrenia  LDL 72  Hospital day # 2  TREATMENT/PLAN  Continue aspirin 325 mg orally every day for secondary stroke prevention.  Rehabilitation consult in place. HH  at group home vs short CIR stay vs SNF  No further stroke workup indicated.  Patient has a 10-15% risk of having another stroke over the next year, the highest risk is within 2 weeks of the most recent stroke/TIA (risk of having a stroke following a stroke or TIA is the same).  Ongoing risk factor control by Primary Care Physician  Stroke Service will sign off. Please call should any needs arise.  Follow up with Dr. Pearlean Brownie, Stroke Clinic, in 2 months.  Annie Main, MSN, RN, ANVP-BC, AGPCNP-BC [facility] Pager: [phone] 08/13/2013 10:45 AM  I have personally obtained a history, examined the patient, evaluated imaging results, and formulated the assessment  and plan of care. I agree with the above. Delia Heady, MD  To contact Stroke Continuity provider, please refer to [email or web address]. After hours, contact General Neurology

## 2013-08-13 NOTE — Progress Notes (Signed)
Pt was given a bath put in a recliner chair and the sitter turned around to call for blanket and pt hopped out of the recliner and fell on the floor.  Pt assessed,  vital signs stable  stable pt denied  Pain nor injury to self. No apparent Injury noted . MD made aware.  Pt remained on close observation  for safety, as pt with impulsive behavior, and cognitively impaired. RN will continue to monitor pt closely.

## 2013-08-13 NOTE — Progress Notes (Signed)
[facility] PROGRESS NOTE  ADRIENNE DELAY ZOX:[id number] DOB: 05-Dec-1940 DOA: 08/11/2013 PCP: No primary provider on file.  Interim history 73 y.o. male has a past medical history significant for mental retardation, paranoid schizophrenia, currently living in a group home, hypothyroidism, presents to the emergency room with a chief complaint of right leg weakness.  At baseline he is able to walk without any problems, however on the day of admission, his right leg "gave out" causing him to fall. On arrival to the emergency room, stroke code was initiated and neurology evaluated the patient. The patient is a poor historian.. Workup was undertaken. MRI of the brain showed an acute stroke in the left motor strip as well as an old stroke in the left frontal lobe.  Assessment/Plan: Acute ischemic stroke -MRI brain--acute infarction left motor strip -Patient was not on any of the therapy prior to admission -Carotid ultrasound negative for hemodynamically significant stenosis -Echocardiogram shows EF 55-60%, grade 1 diastolic dysfunction, no thrombi -Given the patient's underlying psychiatric diagnoses, neurology did not recommend pursuing further embolic workup -LDL 72 -Hemoglobin A1c 5.9 -consulted CIR -start low dose statin Colonic ileus/stool impaction -Repeat abdominal x-ray on 08/12/2013 showing  bowel gas pattern suggests an fecal impaction with moderate  stool burden more proximally in the colon -Patient having a large bowel movement this morning -Tolerating diet -benign abdominal exam Paranoid schizophrenia -Continue Cogentin and Risperdal -Continue Paxil Hypothyroidism -Continue Synthroid  Family Communication:   sister at beside Disposition Plan:  Patient evaluated by inpatient rehabilitation, discussed discharge planning with social work, his group home may not be able to provide care, considering SNF vs CIR placement.    Procedures/Studies: Dg Chest 2 View  08/11/2013    CLINICAL DATA:  Weakness.  CVA.  EXAM: CHEST  2 VIEW  COMPARISON:  12/22/2007  FINDINGS: The heart is within normal limits in size. There is tortuosity and calcification of the thoracic aorta. Low lung volumes with vascular crowding and bibasilar atelectasis. No definite infiltrates or effusions.  Colonic interposition with marked dilatation likely due to colonic ileus/inertia.  IMPRESSION: No acute cardiopulmonary findings. Low lung volumes with vascular crowding and atelectasis.  Colonic interposition and dilated colon likely due to colonic ileus.   Electronically Signed   By: Loralie Champagne M.D.   On: 08/11/2013 17:17   Dg Hip Complete Right  08/11/2013   CLINICAL DATA:  Hip pain.  EXAM: RIGHT HIP - COMPLETE 2+ VIEW  COMPARISON:  None.  FINDINGS: Both hips are normally located. No significant degenerative changes. No fracture or plain film evidence of avascular necrosis. The pubic symphysis and SI joints are intact. There is a large amount of stool in the colon and fecal impaction in the rectum.  IMPRESSION: No definite acute bony findings.  Constipation and fecal impaction in the rectum.   Electronically Signed   By: Loralie Champagne M.D.   On: 08/11/2013 17:23   Ct Head Wo Contrast  08/11/2013   CLINICAL DATA:  Right leg weakness.  EXAM: CT HEAD WITHOUT CONTRAST  TECHNIQUE: Contiguous axial images were obtained from the base of the skull through the vertex without intravenous contrast.  COMPARISON:  12/12/2007  FINDINGS: Area of low density noted within the left frontal lobe, likely subacute infarct. No hemorrhage. No other areas of cortical infarct. Chronic microvascular changes in the deep white matter. Diffuse cerebral atrophy. No hydrocephalus or midline shift.  No acute calvarial abnormality. Probable retention cyst in the right sphenoid sinus. Paranasal sinuses and mastoids otherwise  clear. Orbital soft tissues unremarkable.  IMPRESSION: Area of low density in the left frontal lobe, favor subacute  infarct.  Atrophy, chronic microvascular disease.  Critical Value/emergent results attempted to be called to Dr. Amada JupiterKirkpatrick beginning at 2:39 p.m. I have paged Dr. Amada JupiterKirkpatrick 3 times but never received a return call, the last attempt occurring at 3:04 p.m.   Electronically Signed   By: Charlett NoseKevin  Dover M.D.   On: 08/11/2013 15:05   Mr Brain Wo Contrast  08/11/2013   CLINICAL DATA:  73 year old mental challenged patient with right lower extremity weakness. Code stroke. Previous left MCA territory infarcts.  EXAM: MRI HEAD WITHOUT CONTRAST  TECHNIQUE: Multiplanar, multiecho pulse sequences of the brain and surrounding structures were obtained without intravenous contrast.  COMPARISON:  Head CT without contrast 08/11/2013.  FINDINGS: By stroke team request only axial and coronal diffusion weighted imaging plus axial FLAIR was performed. Study is intermittently degraded by motion artifact.  Subtle areas of asymmetric increased trace diffusion with evidence of restricted diffusion at the left motor strip medial to the upper extremity representation, area of trunk and proximal right lower extremity representation. See series 3, image 26). This corresponds to series 4, image 10 on coronal diffusion imaging, were the asymmetry is less conspicuous. No FLAIR hyperintensity in this area.  Chronic small to moderate size cortically based infarcts with encephalomalacia in the more anterior left frontal lobe, including pre motor and speech areas (series 300, image 22).  No midline shift, mass effect, or evidence of intracranial mass lesion. No ventriculomegaly.  Right sphenoid sinus mucous retention cyst.  IMPRESSION: Small areas of cortically based restricted diffusion suspected in the medial left motor strip, corresponding to right trunk and proximal right lower extremity representation.  Study reviewed in person with Dr. Ritta SlotMcNeill Kirkpatrick on 08/11/2013 at 15:07 hours .   Electronically Signed   By: Augusto GambleLee  Hall M.D.   On:  08/11/2013 15:16         Subjective: Patient denies any fevers, chills, chest pain, shortness breath, nausea, vomiting, diarrhea, abdominal pain. No dysuria or hematuria.  Objective: Filed Vitals:   08/12/13 2124 08/13/13 0211 08/13/13 0642 08/13/13 0912  BP: 131/87 137/80 131/79 124/72  Pulse: 68 85 63 86  Temp: 98.1 F (36.7 C) 97.7 F (36.5 C) 98 F (36.7 C) 97.9 F (36.6 C)  TempSrc: Oral Oral Oral Oral  Resp: 20 20 20 20   SpO2: 97% 96% 98% 96%    Intake/Output Summary (Last 24 hours) at 08/13/13 1233 Last data filed at 08/13/13 0900  Gross per 24 hour  Intake   1000 ml  Output    200 ml  Net    800 ml   Weight change:  Exam:   General:  Pt is alert, follows commands appropriately, not in acute distress  HEENT: No icterus, No thrush, [place]/AT  Cardiovascular: RRR, S1/S2, no rubs, no gallops  Respiratory: CTA bilaterally, no wheezing, no crackles, no rhonchi  Abdomen: Soft/+BS, non tender, non distended, no guarding  Extremities: No edema, No lymphangitis, No petechiae, No rashes, no synovitis  Data Reviewed: Basic Metabolic Panel:  Recent Labs Lab 08/11/13 1428 08/11/13 1440 08/12/13 0735 08/13/13 0447  NA 143 144 144 143  K 4.4 4.2 3.9 3.6*  CL 108 107 109 108  CO2 24  --  22 21  GLUCOSE 108* 108* 99 95  BUN 18 19 13 12   CREATININE 0.84 1.00 0.67 0.68  CALCIUM 9.3  --  8.8 9.0   Liver Function  Tests:  Recent Labs Lab 08/11/13 1428  AST 14  ALT 7  ALKPHOS 70  BILITOT 0.3  PROT 6.5  ALBUMIN 3.8   No results found for this basename: LIPASE, AMYLASE,  in the last 168 hours No results found for this basename: AMMONIA,  in the last 168 hours CBC:  Recent Labs Lab 08/11/13 1428 08/11/13 1440 08/12/13 0735  WBC 6.5  --  7.4  NEUTROABS 4.6  --   --   HGB 14.1 13.9 14.1  HCT 40.6 41.0 40.7  MCV 90.2  --  89.6  PLT 198  --  198   Cardiac Enzymes: No results found for this basename: CKTOTAL, CKMB, CKMBINDEX, TROPONINI,  in the last  168 hours BNP: No components found with this basename: POCBNP,  CBG: No results found for this basename: GLUCAP,  in the last 168 hours  No results found for this or any previous visit (from the past 240 hour(s)).   Scheduled Meds: . aspirin EC  325 mg Oral Daily  . benztropine  0.5 mg Oral BID  . enoxaparin (LOVENOX) injection  40 mg Subcutaneous Q24H  . gabapentin  600 mg Oral TID  . levothyroxine  75 mcg Oral QAC breakfast  . PARoxetine  20 mg Oral Daily  . risperiDONE  1 mg Oral q morning - 10a  . risperiDONE  2 mg Oral QHS  . simvastatin  10 mg Oral q1800   Continuous Infusions:    Jeralyn Bennett, DO  [facility] Pager [phone]  If 7PM-7AM, please contact night-coverage [email or web address] Password [facility] 08/13/2013, 12:33 PM   LOS: 2 days

## 2013-08-13 NOTE — Progress Notes (Signed)
Physical Therapy Treatment Patient Details Name: Scott [name] MRN: [id number] DOB: Jul 15, 1940 Today's Date: 08/13/2013    History of Present Illness pt presents with gait disturbance and r/o CVA.      PT Comments    Pt demos improved consistency in gait speed today and has good activity tolerance.  Pt continues to require ModA with amb 2/2 balance deficits and scissoring gait.  Pt needs hand over hand cueing and facilitation along with verbal cueing for safe technique for transfers and ambulation.  Still feel pt would benefit from CIR to maximize independence and facilitate D/C to group home.    Follow Up Recommendations  CIR     Equipment Recommendations  Rolling walker with 5" wheels    Recommendations for Other Services       Precautions / Restrictions Precautions Precautions: Fall Restrictions Weight Bearing Restrictions: No    Mobility  Bed Mobility                  Transfers Overall transfer level: Needs assistance Equipment used: Rolling walker (2 wheeled) Transfers: Sit to/from Stand Sit to Stand: Min assist         General transfer comment: cues and hand over hand for UE use and controlling descent to sitting, as pt tends to sit impulsively.    Ambulation/Gait Ambulation/Gait assistance: Mod assist Ambulation Distance (Feet): 200 Feet Assistive device: Rolling walker (2 wheeled) Gait Pattern/deviations: Step-through pattern;Decreased stride length;Scissoring;Ataxic;Trunk flexed;Narrow base of support     General Gait Details: pt with improved consistency in gait speed today, but continues to needs ModA for balance and RW management.  pt without IV pole today making him safe to ambulate with 1 person A.     Stairs            Wheelchair Mobility    Modified Rankin (Stroke Patients Only) Modified Rankin (Stroke Patients Only) Pre-Morbid Rankin Score: Moderate disability Modified Rankin: Moderately severe disability     Balance  Overall balance assessment: Needs assistance         Standing balance support: Bilateral upper extremity supported Standing balance-Leahy Scale: Poor                      Cognition Arousal/Alertness: Awake/alert Behavior During Therapy: Impulsive Overall Cognitive Status: No family/caregiver present to determine baseline cognitive functioning                      Exercises      General Comments        Pertinent Vitals/Pain Denied pain.      Home Living                      Prior Function            PT Goals (current goals can now be found in the care plan section) Acute Rehab PT Goals Patient Stated Goal: Back to Group Home Time For Goal Achievement: 08/26/13 Potential to Achieve Goals: Good Progress towards PT goals: Progressing toward goals    Frequency  Min 4X/week    PT Plan Current plan remains appropriate    End of Session Equipment Utilized During Treatment: Gait belt Activity Tolerance: Patient tolerated treatment well Patient left: in chair;with call bell/phone within reach;with chair alarm set;with nursing/sitter in room     Time: 9528-4132 PT Time Calculation (min): 27 min  Charges:  $Gait Training: 23-37 mins  G CodesSunny Schlein:      Rebecah Dangerfield F, [place] CarolinaPT [phone] 08/13/2013, 10:40 AM

## 2013-08-14 ENCOUNTER — Inpatient Hospital Stay (HOSPITAL_COMMUNITY)
Admission: RE | Admit: 2013-08-14 | Discharge: 2013-08-21 | DRG: 945 | Disposition: A | Discharge status: Home Health | Payer: Medicare Other | Source: Intra-hospital | Attending: Physical Medicine & Rehabilitation | Admitting: Physical Medicine & Rehabilitation

## 2013-08-14 DIAGNOSIS — I69998 Other sequelae following unspecified cerebrovascular disease: Secondary | ICD-10-CM

## 2013-08-14 DIAGNOSIS — E785 Hyperlipidemia, unspecified: Secondary | ICD-10-CM | POA: Diagnosis present

## 2013-08-14 DIAGNOSIS — I6340 Cerebral infarction due to embolism of unspecified cerebral artery: Secondary | ICD-10-CM

## 2013-08-14 DIAGNOSIS — F79 Unspecified intellectual disabilities: Secondary | ICD-10-CM | POA: Diagnosis present

## 2013-08-14 DIAGNOSIS — E039 Hypothyroidism, unspecified: Secondary | ICD-10-CM | POA: Diagnosis present

## 2013-08-14 DIAGNOSIS — R269 Unspecified abnormalities of gait and mobility: Secondary | ICD-10-CM

## 2013-08-14 DIAGNOSIS — R5381 Other malaise: Secondary | ICD-10-CM

## 2013-08-14 DIAGNOSIS — R5383 Other fatigue: Secondary | ICD-10-CM

## 2013-08-14 DIAGNOSIS — F209 Schizophrenia, unspecified: Secondary | ICD-10-CM

## 2013-08-14 DIAGNOSIS — F200 Paranoid schizophrenia: Secondary | ICD-10-CM | POA: Diagnosis present

## 2013-08-14 DIAGNOSIS — I639 Cerebral infarction, unspecified: Secondary | ICD-10-CM

## 2013-08-14 DIAGNOSIS — R29898 Other symptoms and signs involving the musculoskeletal system: Secondary | ICD-10-CM | POA: Diagnosis present

## 2013-08-14 DIAGNOSIS — Z5189 Encounter for other specified aftercare: Principal | ICD-10-CM

## 2013-08-14 LAB — CREATININE, SERUM
Creatinine, Ser: 0.68 mg/dL (ref 0.50–1.35)
GFR calc Af Amer: >90 mL/min (ref >90)

## 2013-08-14 LAB — CBC
HEMATOCRIT: 41.5 % (ref 39.0–52.0)
Hemoglobin: 14.6 g/dL (ref 13.0–17.0)
MCH: 31.7 pg (ref 26.0–34.0)
MCHC: 35.2 g/dL (ref 30.0–36.0)
MCV: 90.2 fL (ref 78.0–100.0)
Platelets: 203 10*3/uL (ref 150–400)
RBC: 4.6 MIL/uL (ref 4.22–5.81)
RDW: 12.4 % (ref 11.5–15.5)
WBC: 7.8 10*3/uL (ref 4.0–10.5)

## 2013-08-14 MED ORDER — PAROXETINE HCL 20 MG PO TABS
20.0000 mg | ORAL_TABLET | Freq: Every day | ORAL | Status: DC
  Administered 2013-08-15 – 2013-08-21 (×7): 20 mg via ORAL
  Filled 2013-08-14 (×8): qty 1

## 2013-08-14 MED ORDER — SENNOSIDES-DOCUSATE SODIUM 8.6-50 MG PO TABS: 1.0000 | ORAL_TABLET | Freq: Every evening | ORAL | Status: DC | PRN

## 2013-08-14 MED ORDER — ACETAMINOPHEN 325 MG PO TABS: 650.0000 mg | ORAL_TABLET | ORAL | Status: DC | PRN

## 2013-08-14 MED ORDER — ENOXAPARIN SODIUM 40 MG/0.4ML ~~LOC~~ SOLN
40.0000 mg | SUBCUTANEOUS | Status: DC
  Administered 2013-08-14: 40 mg via SUBCUTANEOUS
  Filled 2013-08-14: qty 0.4

## 2013-08-14 MED ORDER — SORBITOL 70 % SOLN: 30.0000 mL | Freq: Every day | Status: DC | PRN

## 2013-08-14 MED ORDER — RISPERIDONE 1 MG PO TABS
1.0000 mg | ORAL_TABLET | Freq: Every morning | ORAL | Status: DC
  Administered 2013-08-15 – 2013-08-21 (×7): 1 mg via ORAL
  Filled 2013-08-14 (×8): qty 1

## 2013-08-14 MED ORDER — LEVOTHYROXINE SODIUM 75 MCG PO TABS
75.0000 ug | ORAL_TABLET | Freq: Every day | ORAL | Status: DC
  Administered 2013-08-15 – 2013-08-21 (×7): 75 ug via ORAL
  Filled 2013-08-14 (×8): qty 1

## 2013-08-14 MED ORDER — BENZTROPINE MESYLATE 0.5 MG PO TABS
0.5000 mg | ORAL_TABLET | Freq: Two times a day (BID) | ORAL | Status: DC
  Administered 2013-08-14 – 2013-08-21 (×14): 0.5 mg via ORAL
  Filled 2013-08-14 (×16): qty 1

## 2013-08-14 MED ORDER — ASPIRIN 325 MG PO TBEC: 325.0000 mg | DELAYED_RELEASE_TABLET | Freq: Every day | ORAL | Status: DC

## 2013-08-14 MED ORDER — ONDANSETRON HCL 4 MG/2ML IJ SOLN: 4.0000 mg | Freq: Four times a day (QID) | INTRAMUSCULAR | Status: DC | PRN

## 2013-08-14 MED ORDER — GABAPENTIN 300 MG PO CAPS
600.0000 mg | ORAL_CAPSULE | Freq: Three times a day (TID) | ORAL | Status: DC
  Administered 2013-08-14 – 2013-08-21 (×20): 600 mg via ORAL
  Filled 2013-08-14 (×23): qty 2

## 2013-08-14 MED ORDER — SIMVASTATIN 10 MG PO TABS
10.0000 mg | ORAL_TABLET | Freq: Every day | ORAL | Status: DC
Start: 2013-08-14 — End: 2013-08-21
  Administered 2013-08-14 – 2013-08-20 (×7): 10 mg via ORAL
  Filled 2013-08-14 (×8): qty 1

## 2013-08-14 MED ORDER — ENOXAPARIN SODIUM 40 MG/0.4ML ~~LOC~~ SOLN
40.0000 mg | SUBCUTANEOUS | Status: DC
  Administered 2013-08-15 – 2013-08-20 (×6): 40 mg via SUBCUTANEOUS
  Filled 2013-08-14 (×8): qty 0.4

## 2013-08-14 MED ORDER — SIMVASTATIN 10 MG PO TABS: 10.0000 mg | ORAL_TABLET | Freq: Every day | ORAL | Status: DC

## 2013-08-14 MED ORDER — ONDANSETRON HCL 4 MG PO TABS: 4.0000 mg | ORAL_TABLET | Freq: Four times a day (QID) | ORAL | Status: DC | PRN

## 2013-08-14 MED ORDER — RISPERIDONE 2 MG PO TABS
2.0000 mg | ORAL_TABLET | Freq: Every day | ORAL | Status: DC
  Administered 2013-08-14 – 2013-08-20 (×7): 2 mg via ORAL
  Filled 2013-08-14 (×8): qty 1

## 2013-08-14 MED ORDER — ASPIRIN EC 325 MG PO TBEC
325.0000 mg | DELAYED_RELEASE_TABLET | Freq: Every day | ORAL | Status: DC
  Administered 2013-08-15 – 2013-08-21 (×7): 325 mg via ORAL
  Filled 2013-08-14 (×8): qty 1

## 2013-08-14 NOTE — Progress Notes (Signed)
Physical Therapy Treatment Patient Details Name: Scott FossaJohn G [name] MRN: [id number] DOB: 09/17/1940 Today's Date: 08/14/2013    History of Present Illness pt presents with gait disturbance and r/o CVA.      PT Comments    Patient is very sweet and agreeable to ambulation. Patient is very concentrated on going back to his group home. Educated patient on working with therapy and getting stronger before going back home. Per case manager patient is planning to DC to CIR prior to returning back to group home.   Follow Up Recommendations  CIR     Equipment Recommendations  Rolling walker with 5" wheels    Recommendations for Other Services       Precautions / Restrictions Precautions Precautions: Fall    Mobility  Bed Mobility               General bed mobility comments: Up in recliner upon arrival  Transfers Overall transfer level: Needs assistance Equipment used: Rolling walker (2 wheeled) Transfers: Sit to/from Stand Sit to Stand: Min assist         General transfer comment: cues and hand over hand for UE use and controlling descent to sitting, as pt tends to sit impulsively.    Ambulation/Gait Ambulation/Gait assistance: Mod assist Ambulation Distance (Feet): 300 Feet Assistive device: Rolling walker (2 wheeled)   Gait velocity: Fluctuating speed   General Gait Details: needs ModA for balance and RW management.  patient tends to drfit with use of RW and ambulation. Patient stated he did better at home with walking. He is away that his balance is off.    Stairs            Wheelchair Mobility    Modified Rankin (Stroke Patients Only) Modified Rankin (Stroke Patients Only) Pre-Morbid Rankin Score: Moderate disability Modified Rankin: Moderately severe disability     Balance                                    Cognition Arousal/Alertness: Awake/alert Behavior During Therapy: Impulsive Overall Cognitive Status: History of cognitive  impairments - at baseline                      Exercises      General Comments        Pertinent Vitals/Pain no apparent distress     Home Living                      Prior Function            PT Goals (current goals can now be found in the care plan section) Progress towards PT goals: Progressing toward goals    Frequency  Min 4X/week    PT Plan Current plan remains appropriate    End of Session Equipment Utilized During Treatment: Gait belt Activity Tolerance: Patient tolerated treatment well Patient left: in chair;with call bell/phone within reach;with nursing/sitter in room     Time: 1440-1504 PT Time Calculation (min): 24 min  Charges:  $Gait Training: 23-37 mins                    G Codes:      Scott BirksRobinette, Scott [name] Scott [name] 08/14/2013, 3:14 PM 08/14/2013 Scott [name], Scott [name] PTA [phone] pager [phone] office

## 2013-08-14 NOTE — H&P (Signed)
Physical Medicine and Rehabilitation Admission H&P  Chief Complaint   Patient presents with   .  right leg weakness   .  Code Stroke   :  Chief complaint: Weakness  HPI: Scott [name] is a 73 y.o. right-handed male with history significant for paranoid schizophrenia/mental retardation and currently living in a group home. Presented 08/11/2013 with right-sided weakness. MRI of the brain shows small area of cortically based a restricted diffusion suspect it in the medial left motor strip. Carotid Dopplers with no ICA stenosis. Echocardiogram with ejection fraction of 66% grade 1 diastolic dysfunction.  Neurology services followup workup ongoing. Patient did not receive TPA. Maintained on aspirin for CVA prophylaxis as well as subcutaneous Lovenox for DVT prophylaxis. Patient is tolerating a regular diet. Patient with a fall 08/12/2013 with a sitter at bedside for safety. Physical and occupational therapy evaluations completed 08/12/2013 with recommendations for physical medicine rehabilitation consult. Patient was admitted for comprehensive rehabilitation program   Patient with poor awareness, oriented to hospital only as well as self  ROS Review of Systems  Unable to perform ROS: mental acuity  No past medical history on file.  No past surgical history on file.  No family history on file.  Social History: has no tobacco, alcohol, and drug history on file.  Allergies: Not on File  Medications Prior to Admission   Medication  Sig  Dispense  Refill   .  benztropine (COGENTIN) 0.5 MG tablet  Take 0.5 mg by mouth 2 (two) times daily.     Marland Kitchen  gabapentin (NEURONTIN) 300 MG capsule  Take 600 mg by mouth 3 (three) times daily.     Marland Kitchen  levothyroxine (SYNTHROID, LEVOTHROID) 75 MCG tablet  Take 75 mcg by mouth daily before breakfast.     .  PARoxetine (PAXIL) 20 MG tablet  Take 20 mg by mouth daily.     .  risperiDONE (RISPERDAL) 1 MG tablet  Take 1 mg by mouth every morning.     .  risperiDONE  (RISPERDAL) 2 MG tablet  Take 2 mg by mouth at bedtime.      Home:  Home Living  Family/patient expects to be discharged to:: Inpatient rehab  Type of Home: Group Home  Additional Comments: pt from a group home, but question if staff will be able to care for pt at this level.  Functional History:  Prior Function  Vocation: Unemployed  Comments: Spoke with pt's sister who states she thinks pt was capable of performing dressing/bathing, but unsure how much he did.  Functional Status:  Mobility:    Ambulation/Gait  Ambulation Distance (Feet): 200 Feet  Gait velocity: Fluctuating speed  General Gait Details: pt with improved consistency in gait speed today, but continues to needs ModA for balance and RW management. pt without IV pole today making him safe to ambulate with 1 person A.   ADL:   Cognition:  Cognition  Overall Cognitive Status: History of cognitive impairments - at baseline  Arousal/Alertness: Awake/alert  Orientation Level: Oriented to person;Oriented to place;Disoriented to situation;Disoriented to time  Attention: (Impaired on all levels)  Memory: Impaired  Awareness: Impaired  Problem Solving: Impaired  Behaviors: Restless;Impulsive;Verbal agitation;Poor frustration tolerance;Perseveration  Safety/Judgment: Impaired  Comments: Pt able to repeat 5 digits forward, recalls 3/5 words without delay. Verbalized what he does at the group home - listen to radio, sit on the porch, make his bed, get dressed, "they cook for me".  Cognition  Arousal/Alertness: Awake/alert  Behavior During Therapy:  Impulsive  Overall Cognitive Status: History of cognitive impairments - at baseline  Physical Exam:  Blood pressure 122/80, pulse 68, temperature 98.1 F (36.7 C), temperature source Oral, resp. rate 20, SpO2 96.00%.  Physical Exam  Vitals reviewed.  HENT:  Head: Normocephalic.  Edentulous  Eyes: EOM are normal.  Neck: Normal range of motion. Neck supple. No thyromegaly  present.  Cardiovascular: Normal rate and regular rhythm.  Respiratory: Effort normal and breath sounds normal. No respiratory distress.  GI: Soft. Bowel sounds are normal. He exhibits no distension.  Neurological: He is alert. He displays normal reflexes. He exhibits normal muscle tone.  Patient was able to provide his name. Continued to state he wanted to return back to group home (highly perseverative). Told me name of his home. Able to identify several simple objects with extra time. Speech slurred but intelligible.. . Follows simple commands. Appeared to move all 4 fairly equally. Sensation grossly normal.  Skin: Skin is warm and dry.  Psychiatric:  MR, perseverative, impulsive--- generally cooperative though  Motor strength is 4/5 bilateral deltoid, bicep, tricep, grip, hip flexor, knee extensors, ankle dorsiflexor plantar flexor No evidence of dysmetria on finger nose to finger testing. Sensory difficult to assess secondary to mental status Results for orders placed during the hospital encounter of 08/11/13 (from the past 48 hour(s))   HEMOGLOBIN A1C Status: Abnormal    Collection Time    08/12/13 7:35 AM   Result  Value  Ref Range    Hemoglobin A1C  5.9 (*)  <5.7 %    Comment:  (NOTE)         According to the ADA Clinical Practice Recommendations for 2011, when     HbA1c is used as a screening test:     >=6.5% Diagnostic of Diabetes Mellitus     (if abnormal result is confirmed)     5.7-6.4% Increased risk of developing Diabetes Mellitus     References:Diagnosis and Classification of Diabetes Mellitus,Diabetes     CBSW,9675,91(MBWGY 1):S62-S69 and Standards of Medical Care in     Diabetes - 2011,Diabetes Care,2011,34 (Suppl 1):S11-S61.    Mean Plasma Glucose  123 (*)  <117 mg/dL    Comment:  Performed at [facility]   CBC Status: None    Collection Time    08/12/13 7:35 AM   Result  Value  Ref Range    WBC  7.4  4.0 - 10.5 K/uL    RBC  4.54  4.22 - 5.81 MIL/uL     Hemoglobin  14.1  13.0 - 17.0 g/dL    HCT  40.7  39.0 - 52.0 %    MCV  89.6  78.0 - 100.0 fL    MCH  31.1  26.0 - 34.0 pg    MCHC  34.6  30.0 - 36.0 g/dL    RDW  12.4  11.5 - 15.5 %    Platelets  198  150 - 400 K/uL   BASIC METABOLIC PANEL Status: None    Collection Time    08/12/13 7:35 AM   Result  Value  Ref Range    Sodium  144  137 - 147 mEq/L    Potassium  3.9  3.7 - 5.3 mEq/L    Chloride  109  96 - 112 mEq/L    CO2  22  19 - 32 mEq/L    Glucose, Bld  99  70 - 99 mg/dL    BUN  13  6 - 23 mg/dL  Creatinine, Ser  0.67  0.50 - 1.35 mg/dL    Calcium  8.8  8.4 - 10.5 mg/dL    GFR calc non Af Amer  >90  >90 mL/min    GFR calc Af Amer  >90  >90 mL/min    Comment:  (NOTE)     The eGFR has been calculated using the CKD EPI equation.     This calculation has not been validated in all clinical situations.     eGFR's persistently <90 mL/min signify possible Chronic Kidney     Disease.   LIPID PANEL Status: None    Collection Time    08/12/13 7:35 AM   Result  Value  Ref Range    Cholesterol  126  0 - 200 mg/dL    Triglycerides  48  <150 mg/dL    HDL  44  >39 mg/dL    Total CHOL/HDL Ratio  2.9     VLDL  10  0 - 40 mg/dL    LDL Cholesterol  72  0 - 99 mg/dL    Comment:      Total Cholesterol/HDL:CHD Risk     Coronary Heart Disease Risk Table     Men Women     1/2 Average Risk 3.4 3.3     Average Risk 5.0 4.4     2 X Average Risk 9.6 7.1     3 X Average Risk 23.4 11.0         Use the calculated Patient Ratio     above and the CHD Risk Table     to determine the patient's CHD Risk.         ATP III CLASSIFICATION (LDL):     <100 mg/dL Optimal     100-129 mg/dL Near or Above     Optimal     130-159 mg/dL Borderline     160-189 mg/dL High     >190 mg/dL Very High   BASIC METABOLIC PANEL Status: Abnormal    Collection Time    08/13/13 4:47 AM   Result  Value  Ref Range    Sodium  143  137 - 147 mEq/L    Potassium  3.6 (*)  3.7 - 5.3 mEq/L    Chloride  108  96 - 112  mEq/L    CO2  21  19 - 32 mEq/L    Glucose, Bld  95  70 - 99 mg/dL    BUN  12  6 - 23 mg/dL    Creatinine, Ser  0.68  0.50 - 1.35 mg/dL    Calcium  9.0  8.4 - 10.5 mg/dL    GFR calc non Af Amer  >90  >90 mL/min    GFR calc Af Amer  >90  >90 mL/min    Comment:  (NOTE)     The eGFR has been calculated using the CKD EPI equation.     This calculation has not been validated in all clinical situations.     eGFR's persistently <90 mL/min signify possible Chronic Kidney     Disease.    Dg Abd 2 Views  08/13/2013 CLINICAL DATA: Clinical constipation, ileus EXAM: ABDOMEN - 2 VIEW COMPARISON: None. FINDINGS: There is a large amount of stool in the rectosigmoid. The pattern may indicate a fecal impaction. Elsewhere are loops of mildly distended fluid and gas-filled bowel present. This appears to be predominantly colon. No free extraluminal gas collections are demonstrated. There is curvature of the lumbar spine and there  are compressions of L1 and L2. IMPRESSION: The bowel gas pattern suggests an fecal impaction with moderate stool burden more proximally in the colon. There is no high-grade obstructive pattern demonstrated. There is no evidence of free extraluminal gas. Electronically Signed By: David Martinique On: 08/13/2013 08:22   Post Admission Physician Evaluation:  1. Functional deficits secondary to left frontal parietal infarct, acute in a patient with underlying mental retardation and schizophrenia. 2. Patient is admitted to receive collaborative, interdisciplinary care between the physiatrist, rehab nursing staff, and therapy team. 3. Patient's level of medical complexity and substantial therapy needs in context of that medical necessity cannot be provided at a lesser intensity of care such as a SNF. 4. Patient has experienced substantial functional loss from his/her baseline which was documented above under the "Functional History" and "Functional Status" headings. Judging by the patient's  diagnosis, physical exam, and functional history, the patient has potential for functional progress which will result in measurable gains while on inpatient rehab. These gains will be of substantial and practical use upon discharge in facilitating mobility and self-care at the household level. 5. Physiatrist will provide 24 hour management of medical needs as well as oversight of the therapy plan/treatment and provide guidance as appropriate regarding the interaction of the two. 6. 24 hour rehab nursing will assist with bladder management, bowel management, safety, skin/wound care, disease management, medication administration and patient education and help integrate therapy concepts, techniques,education, etc. 7. PT will assess and treat for/with: pre gait, gait training, endurance , safety, equipment, neuromuscular re education. Goals are: Supervision to min assist with transfers, mobility wheelchair level for community, household distance ambulation with walker. 8. OT will assess and treat for/with: ADLs, Cognitive perceptual skills, Neuromuscular re education, safety, endurance, equipment. Goals are:  supervision for ADLs . 9. SLP will assess and treat for/with:  est. cognitive baseline . Goals are:  facilitate communication given cognitive limitations . 10. Case Management and Social Worker will assess and treat for psychological issues and discharge planning. 11. Team conference will be held weekly to assess progress toward goals and to determine barriers to discharge. 12. Patient will receive at least 3 hours of therapy per day at least 5 days per week. 13. ELOS: 10-12d  14. Prognosis: good Medical Problem List and Plan:  1. 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 DVT Prophylaxis/Anticoagulation: Subcutaneous Lovenox. Monitor platelet counts any signs of bleeding  3. Pain Management: Neurontin 600 mg 3 times a day, Tylenol. Monitor with increased mobility  4. Mood/paranoid schizophrenia. Cogentin 0.5  mg twice a day, Paxil 20 mg daily, Risperdal 1 mg every a.m. 2 mg each bedtime. Will discuss baseline cognition with group home. Bed alarm for safety  5. Neuropsych: This patient is not capable of making decisions on his own behalf.  6. Hypothyroidism. Synthroid. TSH level 0.411  7. Hyperlipidemia. Zocor   Charlett Blake M.D. [facility] FAAPM&R (Sports Med, Neuromuscular Med) Diplomate Am Board of Electrodiagnostic Med   08/14/2013

## 2013-08-14 NOTE — PMR Pre-admission (Signed)
PMR Admission Coordinator Pre-Admission Assessment  Patient: Scott [name] is an 73 y.o., male MRN: [id number] DOB: 08/04/1940 Height:   Weight:                Insurance Information HMO:  No    PPO:       PCP:       IPA:       80/20:       OTHER:   PRIMARY: Medicare A/B      Policy#: [id number]      Subscriber: Scott [name] CM Name:        Phone#:       Fax#:   Pre-Cert#:        Employer:  Not employed Benefits:  Phone #:       Name: [place]ed in [place] Date: A=11/19/05 and B=04/21/05     Deduct: $1260      Out of Pocket Max: none      Life Max: unlimited CIR: 100%      SNF: 100 days Outpatient: 80%     Co-Pay: 20% Home Health: 100%      Co-Pay: none DME: 80%     Co-Pay: 20% Providers: patient's choice  SECONDARY: Medicaid [place] access      Policy#: [id number]      Subscriber: Scott [name] CM Name:        Phone#:       Fax#:   Pre-Cert#:        Employer: Not employed Benefits:  Phone #: [phone]     Name: [employer]ated Eff. Date: 08/14/13 eligible     Deduct:        Out of Pocket Max:        Life Max:   CIR:        SNF:   Outpatient:       Co-Pay:   Home Health:        Co-Pay:   DME:       Co-Pay:     Emergency Contact Information Contact Information   Name Relation Home Work Scott WellsMobile   [name] Other [phone]  [phone]   Scott [name] Sister [phone]    Scott [name] Other  [phone]     Current Medical History  Patient Admitting Diagnosis: Left CVA with impaired balance and gait, mild right sided weakness   History of Present Illness: A [age] y.o. right-handed male with history significant for paranoid schizophrenia/mental retardation and currently living in a group home. Presented 08/11/2013 with right-sided weakness. MRI of the brain shows small area of cortically based a restricted diffusion suspect it in the medial left motor strip. Carotid Dopplers with no ICA stenosis. Echocardiogram with ejection fraction of 60% grade  1 diastolic dysfunction.  Neurology services followup workup ongoing. Patient did not receive TPA. Maintained on aspirin for CVA prophylaxis as well as subcutaneous Lovenox for DVT prophylaxis. Patient is tolerating a regular diet. Patient with a fall 08/12/2013 with a sitter at bedside for safety. Physical and occupational therapy evaluations completed 08/12/2013 with recommendations for physical medicine rehabilitation consult.    Total: 2=NIH  Past Medical History  No past medical history on file.  Family History  family history is not on file.  Prior Rehab/Hospitalizations:  None   Current Medications  Current facility-administered medications:acetaminophen (TYLENOL) tablet 650 mg, 650 mg, Oral, Q4H PRN, Scott KayserKatherine P Schorr, NP, 650 mg at 08/11/13 2308;  aspirin EC tablet 325 mg, 325 mg, Oral, Daily, Scott BentonSharon L Biby, NP, 325 mg at 08/14/13  1103;  benztropine (COGENTIN) tablet 0.5 mg, 0.5 mg, Oral, BID, Scott M Gherghe, MD, 0.5 mg at 08/14/13 1101 enoxaparin (LOVENOX) injection 40 mg, 40 mg, Subcutaneous, Q24H, Scott M Gherghe, MD, 40 mg at 08/13/13 2105;  gabapentin (NEURONTIN) capsule 600 mg, 600 mg, Oral, TID, Scott Gilding, MD, 600 mg at 08/14/13 1101;  levothyroxine (SYNTHROID, LEVOTHROID) tablet 75 mcg, 75 mcg, Oral, QAC breakfast, Scott Gilding, MD, 75 mcg at 08/14/13 0747;  PARoxetine (PAXIL) tablet 20 mg, 20 mg, Oral, Daily, Scott Gilding, MD, 20 mg at 08/14/13 1103 risperiDONE (RISPERDAL) tablet 1 mg, 1 mg, Oral, q morning - 10a, Scott Gilding, MD, 1 mg at 08/14/13 1102;  risperiDONE (RISPERDAL) tablet 2 mg, 2 mg, Oral, QHS, Scott Gilding, MD, 2 mg at 08/13/13 2105;  senna-docusate (Senokot-S) tablet 1 tablet, 1 tablet, Oral, QHS PRN, Scott Gilding, MD;  simvastatin (ZOCOR) tablet 10 mg, 10 mg, Oral, q1800, Scott Hartshorn, MD, 10 mg at 08/13/13 1743  Patients Current Diet: Cardiac  Precautions / Restrictions Precautions Precautions: Fall Restrictions Weight Bearing  Restrictions: No   Prior Activity Level Community (5-7x/wk): Goes to day care center daily and really enjoys it.  Takes a bus to the center from the group home.  Home Assistive Devices / Equipment Home Assistive Devices/Equipment: Eyeglasses  Prior Functional Level Prior Function Comments: Spoke with pt's sister who states she thinks pt was capable of performing dressing/bathing, but unsure how much he did.  Current Functional Level Cognition  Arousal/Alertness: Awake/alert Overall Cognitive Status: History of cognitive impairments - at baseline Orientation Level: Oriented to person;Oriented to place;Disoriented to situation;Disoriented to time Attention:  (Impaired on all levels) Memory: Impaired Awareness: Impaired Problem Solving: Impaired Behaviors: Restless;Impulsive;Verbal agitation;Poor frustration tolerance;Perseveration Safety/Judgment: Impaired Comments: Pt able to repeat 5 digits forward, recalls 3/5 words without delay.  Verbalized what he does at the group home - listen to radio, sit on the porch, make his bed, get dressed, "they cook for me".    Extremity Assessment (includes Sensation/Coordination)  Lower Extremity Assessment: Generalized weakness;RLE deficits/detail  RLE Deficits / Details: Difficult to assess 2/2 cognitive deficits, but seems weaker functionally than L.  Cervical / Trunk Assessment: Kyphotic      ADLs  Overall ADL's : Needs assistance/impaired Grooming: Wash/dry hands;Wash/dry face;Oral care;Moderate assistance;Standing Lower Body Bathing: Sit to/from stand;Moderate assistance Lower Body Dressing: Moderate assistance;Sit to/from stand Toilet Transfer: Moderate assistance;Ambulation (chair) Toileting- Clothing Manipulation and Hygiene: Sit to/from stand;Maximal assistance Functional mobility during ADLs: Moderate assistance;+2 for safety/equipment;Rolling walker General ADL Comments: Pt incontinent of bowel upon arrival. Assistance to perform  hygiene thoroughly. Pt able to wash legs with cues and assistance. Pt able to don/doff socks. Pt ambulated in hallway and leaning to right side.     Mobility  Overal bed mobility: Needs Assistance Bed Mobility: Supine to Sit Supine to sit: Supervision General bed mobility comments: supervision for safety.     Transfers  Overall transfer level: Needs assistance Equipment used: Rolling walker (2 wheeled) Transfers: Sit to/from Stand Sit to Stand: Min assist General transfer comment: cues and hand over hand for UE use and controlling descent to sitting, as pt tends to sit impulsively.      Ambulation / Gait / Stairs / Wheelchair Mobility  Ambulation/Gait Ambulation/Gait assistance: Mod assist Ambulation Distance (Feet): 200 Feet Assistive device: Rolling walker (2 wheeled) Gait Pattern/deviations: Step-through pattern;Decreased stride length;Scissoring;Ataxic;Trunk flexed;Narrow base of support Gait velocity: Fluctuating speed General Gait Details: pt with improved consistency in gait speed  today, but continues to needs ModA for balance and RW management.  pt without IV pole today making him safe to ambulate with 1 person A.      Posture / Balance      Special needs/care consideration BiPAP/CPAP No CPM No Continuous Drip IV No Dialysis No        Life Vest No Oxygen No Special Bed No Trach Size No Wound Vac (area) No       Skin Has a cyst on his mouth                             Bowel mgmt: Last BM 08/13/13  Bladder mgmt: Voiding WDL Diabetic mgmt: Prediabetic    Previous Home Environment Type of Home: Group Home Care Facility [facility]: [facility] Services: No Additional Comments: pt from a group home, but question if staff will be able to care for pt at this level.    Discharge Living Setting Plans for Discharge Living Setting: House;Lives with (comment) (8 men live in a group home) Type of Home at Discharge: House [facility] group home.) Discharge Home  Layout: One level Discharge Home Access: Stairs to enter Entrance Stairs-Number of Steps: 3 Does the patient have any problems obtaining your medications?: No  Social/Family/Support Systems Contact Information: Kennith Gain - sister (h) [phone] (w) [phone] Anticipated Caregiver: Group home attendants Anticipated Caregiver's Contact Information: See emergency contacts Ability/Limitations of Caregiver: Group home can provide supervision Caregiver Availability: 24/7 Discharge Plan Discussed with Primary Caregiver: Yes Is Caregiver In Agreement with Plan?: Yes Does Caregiver/Family have Issues with Lodging/Transportation while Pt is in Rehab?: No  Goals/Additional Needs Patient/Family Goal for Rehab: PT/OT Supervision, ST S/Min A goals Expected length of stay: 1 week Cultural Considerations: Paranoid schizophrenic and Mental retardation. Dietary Needs: Heart diet, thin liquids Equipment Needs: TBD Additional Information: Patient was at [facility] at age 54 on for many years.  Was with parents until they died.  Then sister found group home for patient. Pt/Family Agrees to Admission and willing to participate: Yes Program Orientation Provided & Reviewed with Pt/Caregiver Including Roles  & Responsibilities: Yes  Decrease burden of Care through IP rehab admission: N/A  Possible need for SNF placement upon discharge: Not planned  Patient Condition: This patient's condition remains as documented in the consult dated 08/13/13, in which the Rehabilitation Physician determined and documented that the patient's condition is appropriate for intensive rehabilitative care in an inpatient rehabilitation facility pending MD concerns regarding group home ability to provide care. These areas have been addressed.  Group home will to provide supervision after rehab stay.  Group understands that patient may still have some balance deficits.  Will admit to inpatient rehab today.  Preadmission Screen  Completed By:  Trish Mage, 08/14/2013 1:52 PM ______________________________________________________________________   Discussed status with Dr. Riley Kill on 08/14/13 at 1432 and received telephone approval for admission today.  Admission Coordinator:  Trish Mage, time1432/Date03/26/15

## 2013-08-14 NOTE — Progress Notes (Signed)
Rehab admissions - Evaluated for possible admission.  I spoke with patient who would like to go home.  I called his sister who [facility] to group [facility].  I have called and left a message with the group home, but no response yet.  Need to ask group home if they can take patient back if he still has some balance issues.  Call me for questions.  #[phone]

## 2013-08-14 NOTE — Progress Notes (Signed)
Pt transferred to Rehab from [facility]. Hx of mental retardation. Alert to self, and follows command. Can be impulsive at times. Order received for restraints. Unable to explain therapy schedule d/t pt's cognitive limitations, as well as family not being in room, during orientation process.

## 2013-08-14 NOTE — Discharge Summary (Signed)
Physician Discharge Summary  Scott [name] ZOX:[id number] DOB: 10/22/1940 DOA: 08/11/2013  PCP: No primary provider on file.  Admit date: 08/11/2013 Discharge date: 08/14/2013  Time spent: 35 minutes  Recommendations for Outpatient Follow-up:  1. Please follow up on repeat BMP and CBC in 3-4 days  Discharge Diagnoses:  Principal Problem:   CVA (cerebral infarction) Active Problems:   CVA (cerebral vascular accident)   Hypothyroidism   Paranoid schizophrenia   Ileus   Discharge Condition: Stable  Diet recommendation: Heart Healthy  There were no vitals filed for this visit.  History of present illness:  Scott FossaJohn G [name] is a 73 y.o. male has a past medical history significant for paranoid schizophrenia, currently living in a group home, hypothyroidism, presents to the emergency room with a chief complaint of right leg weakness, at baseline he is able to walk without any problems, however today his right leg "gave out" causing him to fall. On arrival to the emergency room, stroke code was initiated and neurology evaluated the patient by the time I was asked for admission. Stat CT head with area of low density in the left frontal lobe, likely representing subacute infarct, stat MRI with cortically based restricted diffusion suspected in the medial left motor strip. Patient currently has no complaints and asks repeatedly when he will go home. He denies any current symptoms other than the right lower leg weakness, which he appreciates has improved. Specifically, he denies any chest pain, breathing difficulties, he denies any fever or chills, he denies any abdominal pain nausea vomiting or diarrhea  Hospital Course:  Patient is a pleasant 73 year old gentleman with a past medical history of MR, paranoid schizophrenia, currently a resident at a group home, was admitted to the medicine service on 08/08/2013 presenting with right leg weakness. On arrival code stroke called, he was seen and evaluated  by neurology. He was worked up with a CT scan of brain which showed an area of low density in the left frontal lobe that could represent a subacute infarct. Patient was placed on the CVA protocol. He had an MRI of brain performed at 08/11/2013 which shows small areas of cortically based restricted diffusion suspected in the medial left motor strip corresponding to right trunk and proximal right lower extremity. Transthoracic echocardiogram performed on 08/12/2013 showed an ejection fraction of 55-60% without wall motion abnormalities. Neurology recommending continuing aspirin 325 mg by mouth daily. [facility] He was transferred to CIR on 08/14/2013 stable condition.  Procedures:  Transthoracic echocardiogram performed on 08/12/2013 showed an ejection fraction of 55-60% without wall motion abnormality  Carotid Dopplers performed on 08/12/2013 showing 1-39% ICA stenosis  Consultations:  Neurology  Physical therapy  Occupational therapy  Speech pathology  Social work  Discharge Exam: Filed Vitals:   08/14/13 1012  BP: 116/74  Pulse: 85  Temp: 98.3 F (36.8 C)  Resp: 20   General: Pt is alert, follows commands appropriately, not in acute distress  HEENT: No icterus, No thrush, Arp/AT  Cardiovascular: RRR, S1/S2, no rubs, no gallops  Respiratory: CTA bilaterally, no wheezing, no crackles, no rhonchi  Abdomen: Soft/+BS, non tender, non distended, no guarding  Extremities: No edema, No lymphangitis, No petechiae, No rashes, no synovitis   Discharge Instructions  Discharge Orders   Future Orders Complete By Expires   Call MD for:  difficulty breathing, headache or visual disturbances  As directed    Call MD for:  extreme fatigue  As directed  Call MD for:  persistant dizziness or light-headedness  As directed    Call MD for:  persistant nausea and vomiting  As directed    Call MD for:  temperature >100.4  As directed    Diet - low sodium heart  healthy  As directed    Increase activity slowly  As directed        Medication List         aspirin 325 MG EC tablet  Take 1 tablet (325 mg total) by mouth daily.     benztropine 0.5 MG tablet  Commonly known as:  COGENTIN  Take 0.5 mg by mouth 2 (two) times daily.     gabapentin 300 MG capsule  Commonly known as:  NEURONTIN  Take 600 mg by mouth 3 (three) times daily.     levothyroxine 75 MCG tablet  Commonly known as:  SYNTHROID, LEVOTHROID  Take 75 mcg by mouth daily before breakfast.     PARoxetine 20 MG tablet  Commonly known as:  PAXIL  Take 20 mg by mouth daily.     risperiDONE 1 MG tablet  Commonly known as:  RISPERDAL  Take 1 mg by mouth every morning.     risperiDONE 2 MG tablet  Commonly known as:  RISPERDAL  Take 2 mg by mouth at bedtime.     senna-docusate 8.6-50 MG per tablet  Commonly known as:  Senokot-S  Take 1 tablet by mouth at bedtime as needed for moderate constipation.     simvastatin 10 MG tablet  Commonly known as:  ZOCOR  Take 1 tablet (10 mg total) by mouth daily at 6 PM.       Not on File     Follow-up Information   Follow up with SETHI,PRAMODKUMAR P, MD. Schedule an appointment as soon as possible for a visit in 2 months. (Stroke Clinic)    Specialties:  Neurology, Radiology   Contact information:   [address] [place] [address] [phone]        The results of significant diagnostics from this hospitalization (including imaging, microbiology, ancillary and laboratory) are listed below for reference.    Significant Diagnostic Studies: Dg Chest 2 View  08/11/2013   CLINICAL DATA:  Weakness.  CVA.  EXAM: CHEST  2 VIEW  COMPARISON:  12/22/2007  FINDINGS: The heart is within normal limits in size. There is tortuosity and calcification of the thoracic aorta. Low lung volumes with vascular crowding and bibasilar atelectasis. No definite infiltrates or effusions.  Colonic interposition with marked dilatation  likely due to colonic ileus/inertia.  IMPRESSION: No acute cardiopulmonary findings. Low lung volumes with vascular crowding and atelectasis.  Colonic interposition and dilated colon likely due to colonic ileus.   Electronically Signed   By: Loralie Champagne M.D.   On: 08/11/2013 17:17   Dg Hip Complete Right  08/11/2013   CLINICAL DATA:  Hip pain.  EXAM: RIGHT HIP - COMPLETE 2+ VIEW  COMPARISON:  None.  FINDINGS: Both hips are normally located. No significant degenerative changes. No fracture or plain film evidence of avascular necrosis. The pubic symphysis and SI joints are intact. There is a large amount of stool in the colon and fecal impaction in the rectum.  IMPRESSION: No definite acute bony findings.  Constipation and fecal impaction in the rectum.   Electronically Signed   By: Loralie Champagne M.D.   On: 08/11/2013 17:23   Ct Head Wo Contrast  08/11/2013   CLINICAL DATA:  Right leg weakness.  EXAM: CT HEAD WITHOUT CONTRAST  TECHNIQUE: Contiguous axial images were obtained from the base of the skull through the vertex without intravenous contrast.  COMPARISON:  12/12/2007  FINDINGS: Area of low density noted within the left frontal lobe, likely subacute infarct. No hemorrhage. No other areas of cortical infarct. Chronic microvascular changes in the deep white matter. Diffuse cerebral atrophy. No hydrocephalus or midline shift.  No acute calvarial abnormality. Probable retention cyst in the right sphenoid sinus. Paranasal sinuses and mastoids otherwise clear. Orbital soft tissues unremarkable.  IMPRESSION: Area of low density in the left frontal lobe, favor subacute infarct.  Atrophy, chronic microvascular disease.  Critical Value/emergent results attempted to be called to Dr. Amada Jupiter beginning at 2:39 p.m. I have paged Dr. Amada Jupiter 3 times but never received a return call, the last attempt occurring at 3:04 p.m.   Electronically Signed   By: Charlett Nose M.D.   On: 08/11/2013 15:05   Mr Brain Wo  Contrast  08/11/2013   CLINICAL DATA:  73 year old mental challenged patient with right lower extremity weakness. Code stroke. Previous left MCA territory infarcts.  EXAM: MRI HEAD WITHOUT CONTRAST  TECHNIQUE: Multiplanar, multiecho pulse sequences of the brain and surrounding structures were obtained without intravenous contrast.  COMPARISON:  Head CT without contrast 08/11/2013.  FINDINGS: By stroke team request only axial and coronal diffusion weighted imaging plus axial FLAIR was performed. Study is intermittently degraded by motion artifact.  Subtle areas of asymmetric increased trace diffusion with evidence of restricted diffusion at the left motor strip medial to the upper extremity representation, area of trunk and proximal right lower extremity representation. See series 3, image 26). This corresponds to series 4, image 10 on coronal diffusion imaging, were the asymmetry is less conspicuous. No FLAIR hyperintensity in this area.  Chronic small to moderate size cortically based infarcts with encephalomalacia in the more anterior left frontal lobe, including pre motor and speech areas (series 300, image 22).  No midline shift, mass effect, or evidence of intracranial mass lesion. No ventriculomegaly.  Right sphenoid sinus mucous retention cyst.  IMPRESSION: Small areas of cortically based restricted diffusion suspected in the medial left motor strip, corresponding to right trunk and proximal right lower extremity representation.  Study reviewed in person with Dr. Ritta Slot on 08/11/2013 at 15:07 hours .   Electronically Signed   By: Augusto Gamble M.D.   On: 08/11/2013 15:16   Dg Abd 2 Views  08/13/2013   CLINICAL DATA:  Clinical constipation, ileus  EXAM: ABDOMEN - 2 VIEW  COMPARISON:  None.  FINDINGS: There is a large amount of stool in the rectosigmoid. The pattern may indicate a fecal impaction. Elsewhere are loops of mildly distended fluid and gas-filled bowel present. This appears to be  predominantly colon. No free extraluminal gas collections are demonstrated. There is curvature of the lumbar spine and there are compressions of L1 and L2.  IMPRESSION: The bowel gas pattern suggests an fecal impaction with moderate stool burden more proximally in the colon. There is no high-grade obstructive pattern demonstrated. There is no evidence of free extraluminal gas.   Electronically Signed   By: David  [place]   On: 08/13/2013 08:22    Microbiology: No results found for this or any previous visit (from the past 240 hour(s)).   Labs: Basic Metabolic Panel:  Recent Labs Lab 08/11/13 1428 08/11/13 1440 08/12/13 0735 08/13/13 0447  NA 143 144 144 143  K 4.4 4.2 3.9 3.6*  CL 108 107 109 108  CO2 24  --  22 21  GLUCOSE 108* 108* 99 95  BUN 18 19 13 12   CREATININE 0.84 1.00 0.67 0.68  CALCIUM 9.3  --  8.8 9.0   Liver Function Tests:  Recent Labs Lab 08/11/13 1428  AST 14  ALT 7  ALKPHOS 70  BILITOT 0.3  PROT 6.5  ALBUMIN 3.8   No results found for this basename: LIPASE, AMYLASE,  in the last 168 hours No results found for this basename: AMMONIA,  in the last 168 hours CBC:  Recent Labs Lab 08/11/13 1428 08/11/13 1440 08/12/13 0735  WBC 6.5  --  7.4  NEUTROABS 4.6  --   --   HGB 14.1 13.9 14.1  HCT 40.6 41.0 40.7  MCV 90.2  --  89.6  PLT 198  --  198   Cardiac Enzymes: No results found for this basename: CKTOTAL, CKMB, CKMBINDEX, TROPONINI,  in the last 168 hours BNP: BNP (last 3 results) No results found for this basename: PROBNP,  in the last 8760 hours CBG: No results found for this basename: GLUCAP,  in the last 168 hours     Signed:  Jeralyn Bennett  [facility] 08/14/2013, 2:12 PM

## 2013-08-15 ENCOUNTER — Inpatient Hospital Stay (HOSPITAL_COMMUNITY): Payer: Medicare Other | Admitting: Occupational Therapy

## 2013-08-15 ENCOUNTER — Inpatient Hospital Stay (HOSPITAL_COMMUNITY): Payer: Medicare Other | Admitting: Physical Therapy

## 2013-08-15 ENCOUNTER — Inpatient Hospital Stay (HOSPITAL_COMMUNITY): Payer: Medicare Other | Admitting: Speech Pathology

## 2013-08-15 DIAGNOSIS — R269 Unspecified abnormalities of gait and mobility: Secondary | ICD-10-CM

## 2013-08-15 DIAGNOSIS — F209 Schizophrenia, unspecified: Secondary | ICD-10-CM

## 2013-08-15 DIAGNOSIS — I69998 Other sequelae following unspecified cerebrovascular disease: Secondary | ICD-10-CM

## 2013-08-15 DIAGNOSIS — R5383 Other fatigue: Secondary | ICD-10-CM

## 2013-08-15 DIAGNOSIS — R5381 Other malaise: Secondary | ICD-10-CM

## 2013-08-15 DIAGNOSIS — I6340 Cerebral infarction due to embolism of unspecified cerebral artery: Secondary | ICD-10-CM

## 2013-08-15 LAB — COMPREHENSIVE METABOLIC PANEL
ALK PHOS: 72 U/L (ref 39–117)
ALT: 9 U/L (ref 0–53)
AST: 18 U/L (ref 0–37)
Albumin: 3.4 g/dL — ABNORMAL LOW (ref 3.5–5.2)
BUN: 10 mg/dL (ref 6–23)
CO2: 24 meq/L (ref 19–32)
Calcium: 9.2 mg/dL (ref 8.4–10.5)
Chloride: 107 mEq/L (ref 96–112)
Creatinine, Ser: 0.73 mg/dL (ref 0.50–1.35)
GFR calc non Af Amer: 90 mL/min — ABNORMAL LOW (ref >90)
GLUCOSE: 97 mg/dL (ref 70–99)
Potassium: 4.3 mEq/L (ref 3.7–5.3)
SODIUM: 142 meq/L (ref 137–147)
Total Bilirubin: 0.4 mg/dL (ref 0.3–1.2)
Total Protein: 6.3 g/dL (ref 6.0–8.3)

## 2013-08-15 LAB — CBC WITH DIFFERENTIAL/PLATELET
BASOS ABS: 0.1 10*3/uL (ref 0.0–0.1)
Basophils Relative: 1 % (ref 0–1)
Eosinophils Absolute: 0.3 10*3/uL (ref 0.0–0.7)
Eosinophils Relative: 5 % (ref 0–5)
HCT: 40.2 % (ref 39.0–52.0)
Hemoglobin: 13.9 g/dL (ref 13.0–17.0)
LYMPHS ABS: 1.2 10*3/uL (ref 0.7–4.0)
LYMPHS PCT: 18 % (ref 12–46)
MCH: 30.8 pg (ref 26.0–34.0)
MCHC: 34.6 g/dL (ref 30.0–36.0)
MCV: 89.1 fL (ref 78.0–100.0)
Monocytes Absolute: 0.9 10*3/uL (ref 0.1–1.0)
Monocytes Relative: 13 % — ABNORMAL HIGH (ref 3–12)
NEUTROS PCT: 64 % (ref 43–77)
Neutro Abs: 4.4 10*3/uL (ref 1.7–7.7)
PLATELETS: 200 10*3/uL (ref 150–400)
RBC: 4.51 MIL/uL (ref 4.22–5.81)
RDW: 12.4 % (ref 11.5–15.5)
WBC: 7 10*3/uL (ref 4.0–10.5)

## 2013-08-15 NOTE — Progress Notes (Signed)
Per telephone order, enclosure bed discontinued by Deatra Inaan Angiulli, PA.  New order for a safety sitter for the patient and a high-low bed.  Will continue to monitor.

## 2013-08-15 NOTE — Evaluation (Signed)
Speech Language Pathology Assessment and Plan  Patient Details  Name: Scott [name] MRN: [id number] Date of Birth: June 02, 1940  SLP Diagnosis: Dysphagia;Cognitive Impairments  Rehab Potential: Fair ELOS: 5-7 days   Today's Date: 08/15/2013 Time: 8206-0156 Time Calculation (min): 40 min  Problem List:  Patient Active Problem List   Diagnosis Date Noted  . Ileus 08/12/2013  . CVA (cerebral vascular accident) 08/11/2013  . CVA (cerebral infarction) 08/11/2013  . Hypothyroidism 08/11/2013  . Paranoid schizophrenia 08/11/2013   Past Medical History: No past medical history on file. Past Surgical History: No past surgical history on file.  Assessment / Plan / Recommendation Clinical Impression SAIR FAULCON is a 73 y.o. right-handed male with history significant for paranoid schizophrenia/mental retardation and currently living in a group home. Presented 08/11/2013 with right-sided weakness. MRI of the brain shows small area of cortically based a restricted diffusion suspect it in the medial left motor strip. Carotid Dopplers with no ICA stenosis. Echocardiogram with ejection fraction of 15% grade 1 diastolic dysfunction. Neurology services follow-up workup ongoing. Maintained on aspirin for CVA prophylaxis as well as subcutaneous Lovenox for DVT prophylaxis. Patient with a fall 08/12/2013 with a sitter at bedside for safety. Physical and occupational therapy evaluations completed 08/12/2013 with recommendations for physical medicine rehabilitation consult. Patient was admitted for comprehensive rehabilitation program 08/14/13.  Orders received and cognitive-linguistic and bedside swallow evaluations completed.  Patient with poor memory, awareness, problem solving which are now impacting his safety due to mobility changes.  SLP unsure of true baseline function; however, with patient driven tasks sustained attention, expression of needs and wants as well as ability to follow directions appears  functional.  Patient refused to consume regular textures but was observed to consumed Dys.3 textures and thin liquids with significant impulsivity which required cues for safety.  As a result, SLP recommends short term follow up to ensure toleration and safety with regular textures as well as to ensure patient is at baseline function with primary care giver.                 SLP Assessment  Patient will need skilled Speech Lanaguage Pathology Services during CIR admission    Recommendations  Diet Recommendations: Dysphagia 3 (Mechanical Soft);Thin liquid Liquid Administration via: Cup Medication Administration: Whole meds with puree Supervision: Patient able to self feed;Full supervision/cueing for compensatory strategies Compensations: Slow rate;Small sips/bites Postural Changes and/or Swallow Maneuvers: Out of bed for meals;Seated upright 90 degrees;Upright 30-60 min after meal Oral Care Recommendations: Oral care BID Patient destination:  (TBD) Follow up Recommendations: 24 hour supervision/assistance Equipment Recommended: None recommended by SLP    SLP Frequency 5 out of 7 days   SLP Treatment/Interventions Cueing hierarchy;Cognitive remediation/compensation;Dysphagia/aspiration precaution training;Functional tasks;Environmental controls;Internal/external aids;Patient/family education;Therapeutic Activities    Pain Pain Assessment Pain Assessment: No/denies pain Prior Functioning Cognitive/Linguistic Baseline: Baseline deficits Baseline deficit details: MR Type of Home: Group Home Available Help at Discharge: Other (Comment) (unknown)  Short Term Goals: Week 1: SLP Short Term Goal 1 (Week 1): STG=LTG  See FIM for current functional status Refer to Care Plan for Long Term Goals  Recommendations for other services: None  Discharge Criteria: Patient will be discharged from SLP if patient refuses treatment 3 consecutive times without medical reason, if treatment goals not met, if  there is a change in medical status, if patient makes no progress towards goals or if patient is discharged from hospital.  The above assessment, treatment plan, treatment alternatives and goals were discussed and mutually  agreed upon: No family available/patient unable  Carmelia Roller., [place]  [place] 08/15/2013, 5:35 PM

## 2013-08-15 NOTE — Progress Notes (Signed)
Social Work Patient ID: Scott FossaJohn G [name], male   DOB: 10/18/1940, 73 y.o.   MRN: [id number] Awaiting [facility] Home Administrator to call back to answer questions regarding pt's day care activities and what assist they can provide him at discharge.

## 2013-08-15 NOTE — Care Management Note (Signed)
[facility] Individual Statement of Services  Patient Name:  Scott FossaJohn G [name]  Date:  08/15/2013  Welcome to the [facility].  Our goal is to provide you with an individualized program based on your diagnosis and situation, designed to meet your specific needs.  With this comprehensive rehabilitation program, you will be expected to participate in at least 3 hours of rehabilitation therapies Monday-Friday, with modified therapy programming on the weekends.  Your rehabilitation program will include the following services:  Physical Therapy (PT), Occupational Therapy (OT), Speech Therapy (ST), 24 hour per day rehabilitation nursing, Therapeutic Recreaction (TR), Case Management (Social Worker), Rehabilitation Medicine, Nutrition Services and Pharmacy Services  Weekly team conferences will be held on Wednesday to discuss your progress.  Your Social Worker will talk with you frequently to get your input and to update you on team discussions.  Team conferences with you and your family in attendance may also be held.  Expected length of stay: 5-7 days  Overall anticipated outcome: supervision/min level  Depending on your progress and recovery, your program may change. Your Social Worker will coordinate services and will keep you informed of any changes. Your Social Worker's name and contact numbers are listed  below.  The following services may also be recommended but are not provided by the [facility]:   Home Health Rehabiltiation Services  Outpatient Rehabilitation Services   Arrangements will be made to provide these services after discharge if needed.  Arrangements include referral to agencies that provide these services.  Your insurance has been verified to be:  Medicare & Medicaid Your primary doctor is:    Pertinent information will be shared with your doctor and your insurance company.  Social Worker:  Dossie DerBecky Shakyia Bosso, SW  [phone] or [phone]) [phone]  Information discussed with and copy given to patient by: Lucy Chrisupree, Maximo Spratling G, 08/15/2013, 12:43 PM

## 2013-08-15 NOTE — Evaluation (Signed)
Physical Therapy Assessment and Plan  Patient Details  Name: Scott [name] MRN: [id number] Date of Birth: 11-26-1940  PT Diagnosis: Abnormal posture, Abnormality of gait, Cognitive deficits, Difficulty walking, Hemiparesis dominant and Muscle weakness Rehab Potential: Good ELOS: 5-7 days   Today's Date: 08/15/2013 Time: 0900-1018 Time Calculation (min): 78 min  Problem List:  Patient Active Problem List   Diagnosis Date Noted  . Ileus 08/12/2013  . CVA (cerebral vascular accident) 08/11/2013  . CVA (cerebral infarction) 08/11/2013  . Hypothyroidism 08/11/2013  . Paranoid schizophrenia 08/11/2013    Past Medical History: No past medical history on file. Past Surgical History: No past surgical history on file.  Assessment & Plan Clinical Impression: Patient is a 73 y.o. male has a past medical history significant for paranoid schizophrenia, currently living in a group home, hypothyroidism, presents to the emergency room with a chief complaint of right leg weakness, at baseline he is able to walk without any problems, however today his right leg "gave out" causing him to fall. On arrival to the emergency room, stroke code was initiated and neurology evaluated the patient by the time I was asked for admission. Stat CT head with area of low density in the left frontal lobe, likely representing subacute infarct, stat MRI with cortically based restricted diffusion suspected in the medial left motor strip.Carotid Dopplers with no ICA stenosis. Echocardiogram with ejection fraction of 02% grade 1 diastolic dysfunction. Neurology services followup workup ongoing. Patient did not receive TPA. Maintained on aspirin for CVA prophylaxis as well as subcutaneous Lovenox for DVT prophylaxis. Patient is tolerating a regular diet. Patient with a fall 08/12/2013 with a sitter at bedside for safety. Patient currently has no complaints and asks repeatedly when he will go home. He denies any current symptoms  other than the right lower leg weakness, which he appreciates has improved.  Patient transferred to CIR on 08/14/2013 .   Patient currently requires mod with mobility secondary to muscle weakness, impaired timing and sequencing, decreased attention, decreased awareness, decreased problem solving, decreased safety awareness and decreased memory and decreased standing balance, decreased postural control, decreased balance strategies and R hemiparesis .  Prior to hospitalization, patient was independent  with mobility and lived with Other (Comment) (Group home) in a [place] home.  Home access is  Other (comment) (unknown).  Patient will benefit from skilled PT intervention to maximize safe functional mobility, minimize fall risk and decrease caregiver burden for planned discharge home with 24 hour supervision.  Anticipate patient will benefit from follow up [place] at discharge.  PT - End of Session Activity Tolerance: Tolerates 30+ min activity with multiple rests Endurance Deficit: No PT Assessment Rehab Potential: Good Barriers to Discharge: Decreased caregiver support Barriers to Discharge Comments: Unsure if group home can provide full supervision and intermittent min A PT Patient demonstrates impairments in the following area(s): Balance;Motor;Safety PT Transfers Functional Problem(s): Bed to Chair;Bed Mobility;Car;Floor;Furniture PT Locomotion Functional Problem(s): Ambulation;Stairs PT Plan PT Intensity: Minimum of 1-2 x/day ,45 to 90 minutes PT Frequency: 5 out of 7 days PT Duration Estimated Length of Stay: 5-7 days PT Treatment/Interventions: Ambulation/gait training;Balance/vestibular training;Discharge planning;DME/adaptive equipment instruction;Functional mobility training;Neuromuscular re-education;Patient/family education;Stair training;Therapeutic Activities;Therapeutic Exercise;UE/LE Strength taining/ROM PT Transfers Anticipated Outcome(s): supervision PT Locomotion Anticipated  Outcome(s): supervision PT Recommendation Follow Up Recommendations: Home health PT;24 hour supervision/assistance Patient destination: Home (Group home) Equipment Recommended: To be determined Equipment Details: Unsure if pt would be safe or consistent with RW  Skilled Therapeutic Intervention Pt willing to participate  in therapy evaluation if he is able to bring his radio with him.  Following assessment of transfers, gait and stairs pt participate in therapy treatment with focus on initiation, balance and LE strengthening.  Performed standing reaching out of BOS to floor for rings to L and R and returning to stand to toss rings and then reach to floor to gather up rings from floor and replace on shelf with mod A for balance and intermittent R lateral LOB.  Pt also engaged in gait around unit while carrying items in hand (radio, earphones and drink) and functional task of reaching high in cabinets to fix himself a Coke and low to ground or out of BOS to plug and unplug radio.  Pt required multiple rest breaks in recliner to rest and listen to radio to soothe self.  Pt became visibly upset when unable to find his room to get earphones; once need was met pt was calm and able to continue with therapy.  Returned to room and to recliner.  Pt requesting to sit with nurse tech at RN station and listen to radio and drink "strawberry milkshake".  Pt left in recliner with quick release belt with nurse tech to supervise.    PT Evaluation Precautions/Restrictions Precautions Precautions: Fall Precaution Comments: Hx of mental retardation and paranoid schizophrenia, impulsive Restrictions Weight Bearing Restrictions: No General Chart Reviewed: Yes Response to Previous Treatment: Patient with no complaints from previous session. Family/Caregiver Present: No  Pain Pain Assessment Pain Assessment: No/denies pain Home Living/Prior Functioning Home Living Available Help at Discharge: Other (Comment)  (Unknown) Type of Home: Group Home Home Access: Other (comment) (unknown) Home Layout: Other (Comment) (unknown) Additional Comments: pt from a group home, but unsure level of assist staff willing/able to provide  Lives With: Other (Comment) (Group home) Prior Function Level of Independence: Independent with gait;Independent with transfers Driving: No Comments: Acute therapist spoke with pt's sister who states she thinks pt was capable of performing dressing/bathing, but unsure how much he did. Vision/Perception  Vision - Assessment Additional Comments: unable to assess due to pt unable to follow directions  Cognition Overall Cognitive Status: History of cognitive impairments - at baseline Arousal/Alertness: Awake/alert Orientation Level: Oriented to person;Oriented to place Memory: Impaired Awareness: Impaired Problem Solving: Impaired Behaviors: Restless;Impulsive;Poor frustration tolerance;Perseveration Safety/Judgment: Impaired Sensation Sensation Light Touch: Appears Intact Stereognosis: Not tested Hot/Cold: Not tested Proprioception: Not tested Coordination Gross Motor Movements are Fluid and Coordinated: Not tested Motor  Motor Motor: Abnormal postural alignment and control;Motor impersistence Motor - Skilled Clinical Observations: R sided weakness with intermittent LOB to the R during standing/gait.  Motor impersistence observed during gait with intermittent R foot drag.  Presents with forward head, thoracic kyphosis, R rib flaring and increased posterior pelvic tilt; premorbid  Mobility Bed Mobility Bed Mobility: Not assessed Transfers Transfers: Yes Stand Pivot Transfers: 3: Mod assist Stand Pivot Transfer Details (indicate cue type and reason): Mod A for balance secondary to intermittent R lateral LOB upon standing; pt only required intermittent cues to intitiate sit > stand but places hands on arm rests automatically.  Sits uncontrolled. Locomotion   Ambulation Ambulation/Gait Assistance: 3: Mod assist Ambulation Distance (Feet): 400 Feet Ambulation/Gait Assistance Details: Gait in controlled environment over tile and carpet with mod HHA unless pt carrying items in bilat hands (radio and ear phones) and then required assist at pelvis to maintain balance secondary to intermittent R foot drag and LOB.  Pt able to perform head turns, change direction, changes in gait speed  automatically.  Performed retro stepping and lateral stepping around obstacles as needed with mod A for balance. Gait Gait: Yes Gait Pattern: Impaired Gait Pattern: Step-through pattern;Decreased stance time - right;Scissoring;Trunk flexed;Narrow base of support;Decreased dorsiflexion - right Stairs / Additional Locomotion Stairs: Yes Stairs Assistance: 4: Min assist Stairs Assistance Details (indicate cue type and reason): Pt performed stairs with alternating sequence automatically with min A secondary to decreased R foot clearance on stairs Stair Management Technique: Two rails;Alternating pattern;Forwards Number of Stairs: 5 Height of Stairs: 6 Wheelchair Mobility Wheelchair Mobility: No  Trunk/Postural Assessment  Cervical Assessment Cervical Assessment: Exceptions to [facility] (premorbid forward head posture) Thoracic Assessment Thoracic Assessment: Exceptions to [facility] (kyphotic with R rib flaring) Lumbar Assessment Lumbar Assessment: Exceptions to [facility] (increased posterior pelvic tilt) Postural Control Postural Control: Deficits on evaluation (multiple R lateral LOB reaching out of BOS)  Balance Static Sitting Balance Static Sitting - Level of Assistance: 7: Independent Dynamic Sitting Balance Dynamic Sitting - Level of Assistance: 7: Independent Static Standing Balance Static Standing - Balance Support: No upper extremity supported Static Standing - Level of Assistance: 4: Min assist Dynamic Standing Balance Dynamic Standing - Balance Support: No upper extremity  supported Dynamic Standing - Level of Assistance: 3: Mod assist Dynamic Standing - Balance Activities: Lateral lean/weight shifting;Forward lean/weight shifting;Reaching for objects;Reaching across midline Extremity Assessment  RUE Assessment RUE Assessment: Within Functional Limits (strength grossly 4/5, difficult to assess due to cognition) LUE Assessment LUE Assessment: Within Functional Limits (strength grossly 4/5, difficult to assess due to cognition) RLE Assessment RLE Assessment: Exceptions to [facility] RLE Strength RLE Overall Strength: Deficits RLE Overall Strength Comments: unable to formally MMT but demonstrates motor impersistence and intermittent scissoring and R foot drag during gait LLE Assessment LLE Assessment: Within Functional Limits  FIM:  FIM - Bed/Chair Transfer Bed/Chair Transfer: 3: Chair or W/C > Bed: Mod A (lift or lower assist);3: Bed > Chair or W/C: Mod A (lift or lower assist) FIM - Locomotion: Wheelchair Locomotion: Wheelchair: 1: Total Assistance/staff pushes wheelchair (Pt<25%) (in recliner) FIM - Locomotion: Ambulation Ambulation/Gait Assistance: 3: Mod assist Locomotion: Ambulation: 3: Travels 150 ft or more with moderate assistance (Pt: 50 - 74%) FIM - Locomotion: Stairs Locomotion: Scientist, physiological: Hand rail - 2 Locomotion: Stairs: 2: Up and Down 4 - 11 stairs with minimal assistance (Pt.>75%)   Refer to Care Plan for Long Term Goals  Recommendations for other services: None  Discharge Criteria: Patient will be discharged from PT if patient refuses treatment 3 consecutive times without medical reason, if treatment goals not met, if there is a change in medical status, if patient makes no progress towards goals or if patient is discharged from hospital.  The above assessment, treatment plan, treatment alternatives and goals were discussed and mutually agreed upon: No family available/patient unable  Raylene Everts Faucette 08/15/2013, 10:34 AM

## 2013-08-15 NOTE — IPOC Note (Signed)
Overall Plan of Care [facility](IPOC) Patient Details Name: Marya FossaJohn G Resetar MRN: [id number] DOB: 11/28/1940  Admitting Diagnosis: L CVA  Hospital Problems: Active Problems:   CVA (cerebral infarction)     Functional Problem List: Nursing Bowel;Perception;Safety  PT Balance;Motor;Safety  OT Balance;Cognition;Motor;Safety  SLP Cognition;Safety;Nutrition  TR         Basic ADL's: OT Grooming;Bathing;Dressing;Toileting     Advanced  ADL's: OT       Transfers: PT Bed to Chair;Bed Mobility;Car;Floor;Furniture  OT Toilet;Tub/Shower     Locomotion: PT Ambulation;Stairs     Additional Impairments: OT    SLP Swallowing;Social Cognition   Awareness;Problem Solving;Memory;Attention;Social Interaction  TR      Anticipated Outcomes Item Anticipated Outcome  Self Feeding independent  Swallowing  Supervision    Basic self-care  supervision  Toileting  supervision   Bathroom Transfers supervision  Bowel/Bladder  Mod I  Transfers  supervision  Locomotion  supervision  Communication     Cognition     Pain  <3  Safety/Judgment  Supervision   Therapy Plan: PT Intensity: Minimum of 1-2 x/day ,45 to 90 minutes PT Frequency: 5 out of 7 days PT Duration Estimated Length of Stay: 5-7 days OT Intensity: Minimum of 1-2 x/day, 45 to 90 minutes OT Frequency: 5 out of 7 days OT Duration/Estimated Length of Stay: 7-10 days SLP Intensity: Minumum of 1-2 x/day, 30 to 90 minutes SLP Frequency: 5 out of 7 days SLP Duration/Estimated Length of Stay: 5-7 days       Team Interventions: Nursing Interventions Patient/Family Education;Disease Management/Prevention;Skin Care/Wound Management;Pain Management  PT interventions Ambulation/gait training;Balance/vestibular training;Discharge planning;DME/adaptive equipment instruction;Functional mobility training;Neuromuscular re-education;Patient/family education;Stair training;Therapeutic Activities;Therapeutic Exercise;UE/LE Strength taining/ROM   OT Interventions Balance/vestibular training;Cognitive remediation/compensation;Discharge planning;DME/adaptive equipment instruction;Functional mobility training;Pain management;Patient/family education;Psychosocial support;Self Care/advanced ADL retraining;Therapeutic Activities;Therapeutic Exercise;UE/LE Strength taining/ROM;UE/LE Coordination activities  SLP Interventions Cueing hierarchy;Cognitive remediation/compensation;Dysphagia/aspiration precaution training;Functional tasks;Environmental controls;Internal/external aids;Patient/family education;Therapeutic Activities  TR Interventions    SW/CM Interventions Discharge Planning;Psychosocial Support;Patient/Family Education    Team Discharge Planning: Destination: PT-Home (Group home) ,OT-  (group home) , SLP- (TBD) Projected Follow-up: PT-Home health PT;24 hour supervision/assistance, OT-  24 hour supervision/assistance, SLP-24 hour supervision/assistance Projected Equipment Needs: PT-To be determined, OT- To be determined, SLP-None recommended by SLP Equipment Details: PT-Unsure if pt would be safe or consistent with RW, OT-  Patient/family involved in discharge planning: PT- Patient unable/family or caregiver not available,  OT-Patient unable/family or caregiver not available, SLP-Patient unable/family or caregive not available  MD ELOS: 7 days Medical Rehab Prognosis:  Excellent Assessment: The patient has been admitted for CIR therapies. The team will be addressing, functional mobility, strength, stamina, balance, safety, adaptive techniques/equipment, self-care, bowel and bladder mgt, patient and caregiver education, NMR, speech, cognition, communication, ego support, stroke education. Goals have been set at supervision for self-care, mobility, and speech, cognition, swallowing.    Ranelle OysterZachary T. Joelie Schou, MD, FAAPMR      See Team Conference Notes for weekly updates to the plan of care

## 2013-08-15 NOTE — Progress Notes (Signed)
Subjective/Complaints: 73 y.o. right-handed male with history significant for paranoid schizophrenia/mental retardation and currently living in a group home. Presented 08/11/2013 with right-sided weakness. MRI of the brain shows small area of cortically based a restricted diffusion suspect it in the medial left motor strip. Carotid Dopplers with no ICA stenosis. Echocardiogram with ejection fraction of 75% grade 1 diastolic dysfunction.    Objective: Vital Signs: Blood pressure 112/63, pulse 78, temperature 98 F (36.7 C), temperature source Oral, resp. rate 18, SpO2 94.00%. No results found. Results for orders placed during the hospital encounter of 08/14/13 (from the past 72 hour(s))  CBC     Status: None   Collection Time    08/14/13  7:04 PM      Result Value Ref Range   WBC 7.8  4.0 - 10.5 K/uL   RBC 4.60  4.22 - 5.81 MIL/uL   Hemoglobin 14.6  13.0 - 17.0 g/dL   HCT 41.5  39.0 - 52.0 %   MCV 90.2  78.0 - 100.0 fL   MCH 31.7  26.0 - 34.0 pg   MCHC 35.2  30.0 - 36.0 g/dL   RDW 12.4  11.5 - 15.5 %   Platelets 203  150 - 400 K/uL  CREATININE, SERUM     Status: None   Collection Time    08/14/13  7:04 PM      Result Value Ref Range   Creatinine, Ser 0.68  0.50 - 1.35 mg/dL   GFR calc non Af Amer >90  >90 mL/min   GFR calc Af Amer >90  >90 mL/min   Comment: (NOTE)     The eGFR has been calculated using the CKD EPI equation.     This calculation has not been validated in all clinical situations.     eGFR's persistently <90 mL/min signify possible Chronic Kidney     Disease.  CBC WITH DIFFERENTIAL     Status: Abnormal   Collection Time    08/15/13  6:16 AM      Result Value Ref Range   WBC 7.0  4.0 - 10.5 K/uL   RBC 4.51  4.22 - 5.81 MIL/uL   Hemoglobin 13.9  13.0 - 17.0 g/dL   HCT 40.2  39.0 - 52.0 %   MCV 89.1  78.0 - 100.0 fL   MCH 30.8  26.0 - 34.0 pg   MCHC 34.6  30.0 - 36.0 g/dL   RDW 12.4  11.5 - 15.5 %   Platelets 200  150 - 400 K/uL   Neutrophils Relative % 64   43 - 77 %   Neutro Abs 4.4  1.7 - 7.7 K/uL   Lymphocytes Relative 18  12 - 46 %   Lymphs Abs 1.2  0.7 - 4.0 K/uL   Monocytes Relative 13 (*) 3 - 12 %   Monocytes Absolute 0.9  0.1 - 1.0 K/uL   Eosinophils Relative 5  0 - 5 %   Eosinophils Absolute 0.3  0.0 - 0.7 K/uL   Basophils Relative 1  0 - 1 %   Basophils Absolute 0.1  0.0 - 0.1 K/uL  COMPREHENSIVE METABOLIC PANEL     Status: Abnormal   Collection Time    08/15/13  6:16 AM      Result Value Ref Range   Sodium 142  137 - 147 mEq/L   Potassium 4.3  3.7 - 5.3 mEq/L   Chloride 107  96 - 112 mEq/L   CO2 24  19 - 32 mEq/L  Glucose, Bld 97  70 - 99 mg/dL   BUN 10  6 - 23 mg/dL   Creatinine, Ser 0.73  0.50 - 1.35 mg/dL   Calcium 9.2  8.4 - 10.5 mg/dL   Total Protein 6.3  6.0 - 8.3 g/dL   Albumin 3.4 (*) 3.5 - 5.2 g/dL   AST 18  0 - 37 U/L   ALT 9  0 - 53 U/L   Alkaline Phosphatase 72  39 - 117 U/L   Total Bilirubin 0.4  0.3 - 1.2 mg/dL   GFR calc non Af Amer 90 (*) >90 mL/min   GFR calc Af Amer >90  >90 mL/min   Comment: (NOTE)     The eGFR has been calculated using the CKD EPI equation.     This calculation has not been validated in all clinical situations.     eGFR's persistently <90 mL/min signify possible Chronic Kidney     Disease.      General: No acute distress Mood and affect are inappropriate, engrossed with eating ice cream Heart: Regular rate and rhythm no rubs murmurs or extra sounds Lungs: Clear to auscultation, breathing unlabored, no rales or wheezes Abdomen: Positive bowel sounds, soft nontender to palpation, nondistended Extremities: No clubbing, cyanosis, or edema Skin: No evidence of breakdown, no evidence of rash Neurologic: Cranial nerves II through XII intact, motor strength is 4/5 in bilateral deltoid, bicep, tricep, grip, hip flexor, knee extensors, ankle dorsiflexor and plantar flexor Sensory exam unable to assess secondary to MS Cerebellar exam normal finger to nose to finger as well as heel to  shin in bilateral upper and lower extremities Musculoskeletal: Full range of motion in all 4 extremities. No joint swelling   Assessment/Plan: 1. 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 Physiatrist is providing close team supervision and 24 hour management of active medical problems listed below. Physiatrist and rehab team continue to assess barriers to discharge/monitor patient progress toward functional and medical goals. FIM: FIM - Bathing Bathing Steps Patient Completed: Chest;Right Arm;Left Arm;Abdomen;Right upper leg;Left upper leg Bathing: 3: Mod-Patient completes 5-7 41f10 parts or 50-74%  FIM - Upper Body Dressing/Undressing Upper body dressing/undressing: 0: Wears gown/pajamas-no public clothing FIM - Lower Body Dressing/Undressing Lower body dressing/undressing: 0: Wears gown/pajamas-no public clothing  FIM - Toileting Toileting steps completed by patient: Adjust clothing prior to toileting;Performs perineal hygiene;Adjust clothing after toileting Toileting Assistive Devices: Grab bar or rail for support Toileting: 4: Steadying assist  FIM - TRadio producerDevices: Grab bars Toilet Transfers: 4-To toilet/BSC: Min A (steadying Pt. > 75%);4-From toilet/BSC: Min A (steadying Pt. > 75%)        Comprehension Comprehension Mode: Auditory Comprehension: 4-Understands basic 75 - 89% of the time/requires cueing 10 - 24% of the time  Expression Expression Mode: Verbal Expression: 5-Expresses basic needs/ideas: With no assist  Social Interaction Social Interaction: 4-Interacts appropriately 75 - 89% of the time - Needs redirection for appropriate language or to initiate interaction.  Problem Solving Problem Solving: 2-Solves basic 25 - 49% of the time - needs direction more than half the  time to initiate, plan or complete simple activities  Memory Memory: 3-Recognizes or recalls 50 - 74% of the time/requires cueing 25 - 49% of the time  Medical Problem List and Plan:  1. 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 DVT Prophylaxis/Anticoagulation:  Subcutaneous Lovenox. Monitor platelet counts any signs of bleeding  3. Pain Management: Neurontin 600 mg 3 times a day, Tylenol. Monitor with increased mobility  4. Mood/paranoid schizophrenia. Cogentin 0.5 mg twice a day, Paxil 20 mg daily, Risperdal 1 mg every a.m. 2 mg each bedtime. Will discuss baseline cognition with group home. Bed alarm for safety  5. Neuropsych: This patient is not capable of making decisions on his own behalf.  6. Hypothyroidism. Synthroid. TSH level 0.411  7. Hyperlipidemia. Zocor    LOS (Days) 1 A FACE TO FACE EVALUATION WAS PERFORMED  KIRSTEINS,ANDREW E 08/15/2013, 9:08 AM

## 2013-08-15 NOTE — Evaluation (Signed)
Occupational Therapy Assessment and Plan  Patient Details  Name: Scott [name] MRN: [id number] Date of Birth: Oct 30, 1940  OT Diagnosis: abnormal posture, cognitive deficits and muscle weakness (generalized) Rehab Potential: Rehab Potential: Fair ELOS: 7-10 days   Today's Date: 08/15/2013 Time: 0730-0825 Time Calculation (min): 55 min  Problem List:  Patient Active Problem List   Diagnosis Date Noted  . Ileus 08/12/2013  . CVA (cerebral vascular accident) 08/11/2013  . CVA (cerebral infarction) 08/11/2013  . Hypothyroidism 08/11/2013  . Paranoid schizophrenia 08/11/2013    Past Medical History: No past medical history on file. Past Surgical History: No past surgical history on file.  Assessment & Plan Clinical Impression: Patient is a 73 y.o. right-handed male with history significant for paranoid schizophrenia/mental retardation and currently living in a group home. Presented 08/11/2013 with right-sided weakness. MRI of the brain shows small area of cortically based a restricted diffusion suspect it in the medial left motor strip. Carotid Dopplers with no ICA stenosis. Echocardiogram with ejection fraction of 50% grade 1 diastolic dysfunction.  Neurology services followup workup ongoing. Patient did not receive TPA. Maintained on aspirin for CVA prophylaxis as well as subcutaneous Lovenox for DVT prophylaxis. Patient is tolerating a regular diet. Patient with a fall 08/12/2013 with a sitter at bedside for safety. Physical and occupational therapy evaluations completed 08/12/2013 with recommendations for physical medicine rehabilitation consult.    Patient transferred to CIR on 08/14/2013 .    Patient currently requires mod with basic self-care skills secondary to muscle weakness and decreased initiation, decreased attention, decreased awareness, decreased problem solving, decreased safety awareness, decreased memory and delayed processing.  Prior to hospitalization, patient could  complete ADLs with unknown level of assist.  Patient will benefit from skilled intervention to increase independence with basic self-care skills prior to discharge return to group home.  Anticipate patient will require 24 hour supervision and no further OT follow recommended.  OT - End of Session Activity Tolerance: Endurance does not limit participation in activity Endurance Deficit: No OT Assessment Rehab Potential: Fair Barriers to Discharge: Other (comment) Barriers to Discharge Comments: unsure what level of assist available at group home OT Patient demonstrates impairments in the following area(s): Balance;Cognition;Motor;Safety OT Basic ADL's Functional Problem(s): Grooming;Bathing;Dressing;Toileting OT Transfers Functional Problem(s): Toilet;Tub/Shower OT Plan OT Intensity: Minimum of 1-2 x/day, 45 to 90 minutes OT Frequency: 5 out of 7 days OT Duration/Estimated Length of Stay: 7-10 days OT Treatment/Interventions: Balance/vestibular training;Cognitive remediation/compensation;Discharge planning;DME/adaptive equipment instruction;Functional mobility training;Pain management;Patient/family education;Psychosocial support;Self Care/advanced ADL retraining;Therapeutic Activities;Therapeutic Exercise;UE/LE Strength taining/ROM;UE/LE Coordination activities OT Self Feeding Anticipated Outcome(s): independent OT Basic Self-Care Anticipated Outcome(s): supervision OT Toileting Anticipated Outcome(s): supervision OT Bathroom Transfers Anticipated Outcome(s): supervision OT Recommendation Patient destination:  (group home) Follow Up Recommendations: 24 hour supervision/assistance Equipment Recommended: To be determined   Skilled Therapeutic Intervention OT eval initiated, difficult to complete due to pt's decreased attention, perseveration, and poor historian due to premorbid MR and cognitive deficits.  Engaged in bathing at sit > stand level at sink with pt unaware incontinent of BM,  however reporting need to void on toilet.  Ambulated to bathroom with min-mod assist, pt voided in standing with min assist for balance.  Pt perseverating on stopping task to listen to radio or to visualize radio, able to be redirected while bathing however afterwards reports "now we can listen to the radio".  Pt with no clothes on eval, donned new brief and gown.  Pt left in recliner with quick release belt and nurse tech  to supervise during breakfast.    OT Evaluation Precautions/Restrictions  Precautions Precautions: Fall Precaution Comments: Hx of mental retardation and paranoid schizophrenia Restrictions Weight Bearing Restrictions: No General   Vital Signs Therapy Vitals Temp: 98 F (36.7 C) Temp src: Oral Pulse Rate: 78 Resp: 18 BP: 112/63 mmHg Patient Position, if appropriate: Sitting Oxygen Therapy SpO2: 94 % O2 Device: None (Room air) Pain Pain Assessment Pain Assessment: No/denies pain Home Living/Prior Functioning Home Living Available Help at Discharge: Other (Comment) Type of Home: Group Home Additional Comments: pt from a group home, but unsure level of assist staff willing/able to provide  Lives With: Other (Comment) (lives in a group home) Prior Function Comments: Acute therapist spoke with pt's sister who states she thinks pt was capable of performing dressing/bathing, but unsure how much he did. ADL  See FIM Vision/Perception  Vision - Assessment Additional Comments: unable to assess due to pt unable to follow directions  Cognition Overall Cognitive Status: History of cognitive impairments - at baseline Sensation Sensation Light Touch: Appears Intact Stereognosis: Not tested Hot/Cold: Not tested Proprioception: Not tested Coordination Gross Motor Movements are Fluid and Coordinated: Not tested Motor  Motor Motor: Abnormal postural alignment and control;Motor impersistence Motor - Skilled Clinical Observations: R sided weakness with intermittent  LOB to the R during standing/gait.  Motor impersistence observed during gait with intermittent R foot drag.  Presents with forward head, thoracic kyphosis, R rib flaring and increased posterior pelvic tilt; premorbid Mobility  Bed Mobility Bed Mobility: Not assessed  Trunk/Postural Assessment  Cervical Assessment Cervical Assessment: Exceptions to [facility] (premorbid forward head posture) Thoracic Assessment Thoracic Assessment: Exceptions to [facility] (kyphotic with R rib flaring) Lumbar Assessment Lumbar Assessment: Exceptions to [facility] (increased posterior pelvic tilt) Postural Control Postural Control: Deficits on evaluation (multiple R lateral LOB reaching out of BOS)  Balance Static Sitting Balance Static Sitting - Level of Assistance: 7: Independent Dynamic Sitting Balance Dynamic Sitting - Level of Assistance: 7: Independent Static Standing Balance Static Standing - Balance Support: No upper extremity supported Static Standing - Level of Assistance: 4: Min assist Dynamic Standing Balance Dynamic Standing - Balance Support: No upper extremity supported Dynamic Standing - Level of Assistance: 3: Mod assist Dynamic Standing - Balance Activities: Lateral lean/weight shifting;Forward lean/weight shifting;Reaching for objects;Reaching across midline Extremity/Trunk Assessment RUE Assessment RUE Assessment: Within Functional Limits (strength grossly 4/5, difficult to assess due to cognition) LUE Assessment LUE Assessment: Within Functional Limits (strength grossly 4/5, difficult to assess due to cognition)  FIM:  FIM - Grooming Grooming Steps: Wash, rinse, dry face;Wash, rinse, dry hands Grooming: 3: Patient completes 2 of 4 or 3 of 5 steps FIM - Bathing Bathing Steps Patient Completed: Chest;Right Arm;Left Arm;Abdomen;Right upper leg;Left upper leg Bathing: 3: Mod-Patient completes 5-7 51f10 parts or 50-74% FIM - Upper Body Dressing/Undressing Upper body dressing/undressing: 0: Wears  gown/pajamas-no public clothing FIM - Lower Body Dressing/Undressing Lower body dressing/undressing: 0: Wears gown/pajamas-no public clothing FIM - Toileting Toileting steps completed by patient: Adjust clothing prior to toileting;Performs perineal hygiene;Adjust clothing after toileting Toileting Assistive Devices: Grab bar or rail for support Toileting: 4: Steadying assist FIM - TRadio producerDevices: Grab bars Toilet Transfers: 4-To toilet/BSC: Min A (steadying Pt. > 75%);4-From toilet/BSC: Min A (steadying Pt. > 75%)   Refer to Care Plan for Long Term Goals  Recommendations for other services: None  Discharge Criteria: Patient will be discharged from OT if patient refuses treatment 3 consecutive times without medical reason, if treatment  goals not met, if there is a change in medical status, if patient makes no progress towards goals or if patient is discharged from hospital.  The above assessment, treatment plan, treatment alternatives and goals were discussed and mutually agreed upon: No family available/patient unable  Leonard, [facility] 08/15/2013, 1:17 PM

## 2013-08-15 NOTE — Progress Notes (Signed)
Social Work Assessment and Plan Social Work Assessment and Plan  Patient Details  Name: Scott [name] MRN: [id number] Date of Birth: June 07, 1940  Today's Date: 08/15/2013  Problem List:  Patient Active Problem List   Diagnosis Date Noted  . Ileus 08/12/2013  . CVA (cerebral vascular accident) 08/11/2013  . CVA (cerebral infarction) 08/11/2013  . Hypothyroidism 08/11/2013  . Paranoid schizophrenia 08/11/2013   Past Medical History: No past medical history on file. Past Surgical History: No past surgical history on file. Social History:  has no tobacco, alcohol, and drug history on file.  Family / Support Systems Marital Status: Single Patient Roles: Other (Comment) (Sibling) Other Supports: [employer]  [phone]  [phone]-work    [facility] administrator  [phone] Anticipated Caregiver: Group Home Ability/Limitations of Caregiver: Unsure how much assistance the group home can provide Caregiver Availability: Other (Comment) (Awaiting call from the group home) Family Dynamics: Pt and sister are close and see each other often, he visits sister;s home.  Pt is very preoccupied with going home.  He states; " I want to go home, when can I?"  Sister reports he is alos OCD and set with his schedules.  Social History Preferred language: English Religion: [employer] Cultural Background: No issues Education: Development worker, community reports didn';t really go to school Read: No Write: No Employment Status: Retired Date Retired/Disabled/Unemployed: Retired from sheltered work Management consultant Issues: No issues Guardian/Conservator: Dietitian  Will rely upon her for any decisions which need to be made-MD feels pt is not capable    Abuse/Neglect Physical Abuse: Denies Verbal Abuse: Denies Sexual Abuse: Denies Exploitation of patient/patient's resources: Denies Self-Neglect: Denies  Emotional Status Pt's affect,  behavior adn adjustment status: Pt is listening to his music and really likes this.  He is asking when he can go back home.  Informed needs to stay to work on his balance and walking.  He asks; " In a couple of days then."  He is participating in therapies and does what the team asks of him.   Recent Psychosocial Issues: Other medical issues-Mentaly challenged and schizophrenia Pyschiatric History: History of schizophrenia takes medicines for this and he is regulated.  Sister reports; " The doctor checks him every now and then."  He seems to be coping appropriately and getting used to the new unit staff on rehab. Substance Abuse History: No issues  Patient / Family Perceptions, Expectations & Goals Pt/Family understanding of illness & functional limitations: Sister can explain her brother's stroke and is hopeful he will do well here.  Her main goal is to get him back to the group home where he is home.  She visits him here after work and feel she is progressing in therapies.  Main issue is how much assist can the group home provide. Premorbid pt/family roles/activities: Brother, Resident of [employer], etc Anticipated changes in roles/activities/participation: resume Pt/family expectations/goals: Sister states; ' I hope he can return home and go back to the daycare he loves so."  Pt states; ' I want to go home, in couple of days? " Pt is focusing on returning back home.  Community Resources [employer]: Other (Comment) (Resident of [employer] Group home) Premorbid Home Care/DME Agencies: None Transportation available at discharge: Group Home or sister Resource referrals recommended: Support group (specify) (CVA SUpport group)  Discharge Planning Living Arrangements: Other (Comment) (Group Home-8 males) Support Systems: Other relatives;Friends/neighbors Type of Residence: Group home Insurance Resources: Medicare;Medicaid (specify county) Medical sales representative Co) Surveyor, quantity Resources:  SSI Financial Screen Referred: Previously completed Living Expenses: Other (Comment) (Group HOme) Money Management: Family Does the patient have any problems obtaining your medications?: No Home Management: Staff at Group Home Patient/Family Preliminary Plans: Plan to return to group home if able.  Main issue is how much assist can they provide, what pt needs to be able to do to return.  That is his home since 1999 and he likes it there.  Awaiting group home administrator return call, regarding this information. Social Work Anticipated Follow Up Needs: HH/OP  Clinical Impression Pt is very pleasant and willing to work and do what the team asks him to do.  His main goal is to retun home.  Sister is supportive and involved.  Main issue is what level the group home can Provide to pt and how much assist.  He was independent with ambulation and attended day care during the day prior to admission.  Sister thinks there is no staff during the day at the group home since the other  Residents all work.  Will await return call from administrator and discuss goals with team to formulate the best discharge plan.  Lucy Chrisupree, Doryce Mcgregory G 08/15/2013, 2:09 PM

## 2013-08-16 ENCOUNTER — Inpatient Hospital Stay (HOSPITAL_COMMUNITY): Payer: Medicare Other | Admitting: Physical Therapy

## 2013-08-16 ENCOUNTER — Inpatient Hospital Stay (HOSPITAL_COMMUNITY): Payer: Medicare Other | Admitting: Speech Pathology

## 2013-08-16 ENCOUNTER — Inpatient Hospital Stay (HOSPITAL_COMMUNITY): Payer: Medicare Other | Admitting: Occupational Therapy

## 2013-08-16 DIAGNOSIS — E039 Hypothyroidism, unspecified: Secondary | ICD-10-CM

## 2013-08-16 DIAGNOSIS — F200 Paranoid schizophrenia: Secondary | ICD-10-CM

## 2013-08-16 NOTE — Progress Notes (Signed)
Occupational Therapy Session Note  Patient Details  Name: Scott FossaJohn G [name] MRN: [id number] Date of Birth: 08/27/1940  Today's Date: 08/16/2013 Time: 1115-1200 Time Calculation (min): 45 min  Skilled Therapeutic Interventions/Progress Updates: Patient scheduled for ADL today.  Patient sleeping upon approach and stared unresponsively initially at this clinician when treatment explanation was given.  This clinician waited about 7 minutes for patient to process information,  Ask "When am I going back to the group home,"  and get out of bed and walk to sink with HHA to bath.  He stated, "I do not want to fall" and then sat down after asked if he wanted to sit to sit in his chair for bathing.  With extra time, Patient completed upper body bathing with setup and asked this clinician to wash his periarea.  (perhaps due to concern of falling as expressed earlier during the session).        Patient was asked separately x 3 if he needed to "pee pee" or have a BM.  He said, "No."  However, there was  A small amount of BM present when this clinician assisted him with periarea cleansing.    He was left belted and sitting in his w/c with his sitter upon completion of session.     Therapy Documentation Precautions:  Precautions Precautions: Fall Precaution Comments: Hx of mental retardation and paranoid schizophrenia, impulsive Restrictions Weight Bearing Restrictions: No  Pain: denied   See FIM for current functional status  Therapy/Group: Individual Therapy  Bud Faceickett, Miyuki Rzasa [facility] 08/16/2013, 5:17 PM

## 2013-08-16 NOTE — Progress Notes (Signed)
At 0850, patient in gym with therapist. This nurse notified of patient complaining of chest pain. Denied dizziness, changes in vision. VSS. Patient assisted back to room, ambulated with two standby assist. Stable. No further complaints. Sherlyn Leeseleha, Poppi Scantling S, RN

## 2013-08-16 NOTE — Progress Notes (Signed)
Physical Therapy Session Note  Patient Details  Name: Scott [name] MRN: [id number] Date of Birth: 10/25/1940  Today's Date: 08/16/2013 Time: [id number] and [id number] Time Calculation (min): 58 min and 41 min   Short Term Goals: Week 1:  PT Short Term Goal 1 (Week 1): = LTG secondary to short LOS  Skilled Therapeutic Interventions/Progress Updates:   Pt just finishing breakfast with supervision of sitter.  Following breakfast pt participated in functional standing and sustained attention tasks of making up his bed performing multiple reps of side stepping around bed with min A and reaching across bed to fix blankets and pillow with bilat UE and standing at sink with min-supervision to wash face and hands with min verbal cues for sequencing.  Performed gait on unit without AD but carrying radio in one UE x 150' + 150' with min A overall with decreased episodes of lateral LOB.  Performed dynamic standing balance and sustained attention activity of horseshoe toss reaching to floor to retrieve horseshoes and toss with RUE with multiple sit <> stands from low mat without use of UE to stand.  Also performed reaching to floor to pick up tossed horseshoes with bilat UE and return to box with min A.  Pt transitioned mat > floor with supervision and performed quadruped to retrieve rings from under mat WB through RUE reaching with LUE and then transitioned from quadruped to tall kneeling to sitting on mat with supervision.  Pt became anxious on floor.  Upon returning to mat pt began to c/o CP.  Pt placed in supine supervision and vitals assessed: 121/81 and 79 bpm.  RN alerted and pt assessed by RN.  Ambulated back to room with min A and returned to bed with sitter present.   PM session: pt sitting in w/c listening to his radio.  Pt agreeable to walking with therapist.  Pt performed reaching out of BOS to unplug radio and pt able to carry radio while ambulating x 150' on unit with min A.  Attempted to engage pt in  Wii Dance activity in standing but pt only wanted to listen to his radio.  Attempted to have pt perform dynamic single leg stance and gait training with ambulation with ball kicks; pt performed ambulation ~10 feet while alternating R and LLE kicking ball but with one LOB pt became anxious and wanted to return to his seat.  Again attempted to engage pt in ball tossing activity and seated letter selection activity to continue to work on sustained and selective attention to task but pt poorly engaged this pm and wishing to just listen to his music.  Pt returned to room ambulating with min-mod A secondary to increased R lateral LOB.  Returned to sitting on EOB listening to music.    Therapy Documentation Precautions:  Precautions Precautions: Fall Precaution Comments: Hx of mental retardation and paranoid schizophrenia, impulsive Restrictions Weight Bearing Restrictions: No Vital Signs: Therapy Vitals Temp: 98.6 F (37 C) Temp src: Oral Pulse Rate: 79 BP: 122/81 mmHg (pt with c/o CP) Patient Position, if appropriate: Lying Oxygen Therapy SpO2: 93 % O2 Device: None (Room air) Pain:  Pt reporting CP during therapy; placed in supine and pt reporting ease in symptoms; RN notified.  See FIM for current functional status  Therapy/Group: Individual Therapy  Edman CircleHall, Leidi Astle [facility] Center[facility] 08/16/2013, 8:59 AM

## 2013-08-16 NOTE — Progress Notes (Signed)
Speech Language Pathology Daily Session Note  Patient Details  Name: Scott [name] MRN: [id number] Date of Birth: 07/22/1940  Today's Date: 08/16/2013 Time: 1005-1050 Time Calculation (min): 45 min  Short Term Goals: Week 1: SLP Short Term Goal 1 (Week 1): STG=LTG  Skilled Therapeutic Interventions: Therapeutic intervention complete with short term goals addressed.  Patient refused trial of regular consistency (graham cracker), stating he did not think he could chew it.  Clinician asked patient x3 to see if he could masticate with dentures, but he replied, "maybe when I go home."  DII and DIII textures were given, no overt s/s of aspiration noted.  He does eat too quickly and requires verbal cues to swallow bolus before taking another bite.  Because of his impulsivity, cognition, and edentulous status, he may not be able to advance to regular consistencies.  He consumed whole pills with pudding, requiring liquid wash to assist with 3 of 4 pills, and note regarding this was placed on sheet above his bed.  Continue with current treatment plan.   FIM:  Comprehension Comprehension Mode: Auditory Comprehension: 2-Understands basic 25 - 49% of the time/requires cueing 51 - 75% of the time Expression Expression Mode: Verbal Expression: 4-Expresses basic 75 - 89% of the time/requires cueing 10 - 24% of the time. Needs helper to occlude trach/needs to repeat words. FIM - Eating Eating Activity: 5: Needs verbal cues/supervision;4: Helper checks for pocketed food  Pain Pain Assessment Pain Assessment: No/denies pain  Therapy/Group: Individual Therapy  Lenny PastelSturgill, Glyn Gerads Leigh 08/16/2013, 1:09 PM

## 2013-08-16 NOTE — Progress Notes (Signed)
Patient ID: Scott [name], male   DOB: 06/02/1940, 72 y.o.   MRN: [id number]  Subjective/Complaints:  08/16/13.  72 y.o. right-handed male with history significant for paranoid schizophrenia/mental retardation and currently living in a group home. Presented 08/11/2013 with right-sided weakness. MRI of the brain shows small area of cortically based a restricted diffusion suspect it in the medial left motor strip. Carotid Dopplers with no ICA stenosis. Echocardiogram with ejection fraction of 60% grade 1 diastolic dysfunction.    Objective: Vital Signs: Blood pressure 122/81, pulse 79, temperature 98.6 F (37 C), temperature source Oral, resp. rate 18, SpO2 93.00%. No results found. Results for orders placed during the hospital encounter of 08/14/13 (from the past 72 hour(s))  CBC     Status: None   Collection Time    08/14/13  7:04 PM      Result Value Ref Range   WBC 7.8  4.0 - 10.5 K/uL   RBC 4.60  4.22 - 5.81 MIL/uL   Hemoglobin 14.6  13.0 - 17.0 g/dL   HCT 41.5  39.0 - 52.0 %   MCV 90.2  78.0 - 100.0 fL   MCH 31.7  26.0 - 34.0 pg   MCHC 35.2  30.0 - 36.0 g/dL   RDW 12.4  11.5 - 15.5 %   Platelets 203  150 - 400 K/uL  CREATININE, SERUM     Status: None   Collection Time    08/14/13  7:04 PM      Result Value Ref Range   Creatinine, Ser 0.68  0.50 - 1.35 mg/dL   GFR calc non Af Amer >90  >90 mL/min   GFR calc Af Amer >90  >90 mL/min   Comment: (NOTE)     The eGFR has been calculated using the CKD EPI equation.     This calculation has not been validated in all clinical situations.     eGFR's persistently <90 mL/min signify possible Chronic Kidney     Disease.  CBC WITH DIFFERENTIAL     Status: Abnormal   Collection Time    08/15/13  6:16 AM      Result Value Ref Range   WBC 7.0  4.0 - 10.5 K/uL   RBC 4.51  4.22 - 5.81 MIL/uL   Hemoglobin 13.9  13.0 - 17.0 g/dL   HCT 40.2  39.0 - 52.0 %   MCV 89.1  78.0 - 100.0 fL   MCH 30.8  26.0 - 34.0 pg   MCHC 34.6  30.0 - 36.0 g/dL    RDW 12.4  11.5 - 15.5 %   Platelets 200  150 - 400 K/uL   Neutrophils Relative % 64  43 - 77 %   Neutro Abs 4.4  1.7 - 7.7 K/uL   Lymphocytes Relative 18  12 - 46 %   Lymphs Abs 1.2  0.7 - 4.0 K/uL   Monocytes Relative 13 (*) 3 - 12 %   Monocytes Absolute 0.9  0.1 - 1.0 K/uL   Eosinophils Relative 5  0 - 5 %   Eosinophils Absolute 0.3  0.0 - 0.7 K/uL   Basophils Relative 1  0 - 1 %   Basophils Absolute 0.1  0.0 - 0.1 K/uL  COMPREHENSIVE METABOLIC PANEL     Status: Abnormal   Collection Time    08/15/13  6:16 AM      Result Value Ref Range   Sodium 142  137 - 147 mEq/L   Potassium 4.3  3.7 -   5.3 mEq/L   Chloride 107  96 - 112 mEq/L   CO2 24  19 - 32 mEq/L   Glucose, Bld 97  70 - 99 mg/dL   BUN 10  6 - 23 mg/dL   Creatinine, Ser 0.73  0.50 - 1.35 mg/dL   Calcium 9.2  8.4 - 10.5 mg/dL   Total Protein 6.3  6.0 - 8.3 g/dL   Albumin 3.4 (*) 3.5 - 5.2 g/dL   AST 18  0 - 37 U/L   ALT 9  0 - 53 U/L   Alkaline Phosphatase 72  39 - 117 U/L   Total Bilirubin 0.4  0.3 - 1.2 mg/dL   GFR calc non Af Amer 90 (*) >90 mL/min   GFR calc Af Amer >90  >90 mL/min   Comment: (NOTE)     The eGFR has been calculated using the CKD EPI equation.     This calculation has not been validated in all clinical situations.     eGFR's persistently <90 mL/min signify possible Chronic Kidney     Disease.      General: No acute distress Mood and affect are inappropriate, constantly asking when he will return to group home Heart: Regular rate and rhythm no rubs murmurs or extra sounds Lungs: Clear to auscultation, breathing unlabored, no rales or wheezes Abdomen: Positive bowel sounds, soft nontender to palpation, nondistended Extremities: No clubbing, cyanosis, or edema Skin: No evidence of breakdown, no evidence of rash Neurologic: Cranial nerves II through XII intact, motor strength is 4/5 in bilateral deltoid, bicep, tricep, grip, hip flexor, knee extensors, ankle dorsiflexor and plantar  flexor Sensory exam unable to assess secondary to MS Cerebellar exam normal finger to nose to finger as well as heel to shin in bilateral upper and lower extremities Musculoskeletal: Full range of motion in all 4 extremities. No joint swelling   Patient Vitals for the past 24 hrs:  BP Temp Temp src Pulse SpO2  08/16/13 0850 122/81 mmHg - - 79 -  08/16/13 0817 112/68 mmHg - - 83 93 %  08/16/13 0640 97/58 mmHg 98.6 F (37 C) Oral 69 92 %  08/15/13 1431 105/68 mmHg 97.3 F (36.3 C) Oral 83 95 %     Intake/Output Summary (Last 24 hours) at 08/16/13 1010 Last data filed at 08/15/13 2000  Gross per 24 hour  Intake   1560 ml  Output      0 ml  Net   1560 ml     Assessment/Plan: 1. 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  2. DVT Prophylaxis/Anticoagulation: Subcutaneous Lovenox. Monitor platelet counts any signs of bleeding  3. Pain Management: Neurontin 600 mg 3 times a day, Tylenol. Monitor with increased mobility  4. Mood/paranoid schizophrenia. Cogentin 0.5 mg twice a day, Paxil 20 mg daily, Risperdal 1 mg every a.m. 2 mg each bedtime. Will discuss baseline cognition with group home. Bed alarm for safety   5.  Hypothyroidism. Synthroid. TSH level 0.411  6.  Hyperlipidemia. Zocor    LOS (Days) 2 A FACE TO FACE EVALUATION WAS PERFORMED  Nyoka Cowden 08/16/2013, 10:08 AM

## 2013-08-17 DIAGNOSIS — I6350 Cerebral infarction due to unspecified occlusion or stenosis of unspecified cerebral artery: Secondary | ICD-10-CM

## 2013-08-17 NOTE — Progress Notes (Signed)
Patient ID: KAVAUGHN FAUCETT, male   DOB: 08-Dec-1940, 73 y.o.   MRN: [id number]  Patient ID: RYKAR LEBLEU, male   DOB: 1940/06/20, 73 y.o.   MRN: [id number]  Subjective/Complaints:  08/17/13.  73 y.o. right-handed male with history significant for paranoid schizophrenia/mental retardation and currently living in a group home. Presented 08/11/2013 with right-sided weakness. MRI of the brain shows small area of cortically based a restricted diffusion suspect it in the medial left motor strip. Carotid Dopplers with no ICA stenosis. Echocardiogram with ejection fraction of 26% grade 1 diastolic dysfunction.  No clinical change.   Objective: Vital Signs: Blood pressure 123/80, pulse 67, temperature 97.9 F (36.6 C), temperature source Oral, resp. rate 18, SpO2 95.00%. No results found. Results for orders placed during the hospital encounter of 08/14/13 (from the past 72 hour(s))  CBC     Status: None   Collection Time    08/14/13  7:04 PM      Result Value Ref Range   WBC 7.8  4.0 - 10.5 K/uL   RBC 4.60  4.22 - 5.81 MIL/uL   Hemoglobin 14.6  13.0 - 17.0 g/dL   HCT 41.5  39.0 - 52.0 %   MCV 90.2  78.0 - 100.0 fL   MCH 31.7  26.0 - 34.0 pg   MCHC 35.2  30.0 - 36.0 g/dL   RDW 12.4  11.5 - 15.5 %   Platelets 203  150 - 400 K/uL  CREATININE, SERUM     Status: None   Collection Time    08/14/13  7:04 PM      Result Value Ref Range   Creatinine, Ser 0.68  0.50 - 1.35 mg/dL   GFR calc non Af Amer >90  >90 mL/min   GFR calc Af Amer >90  >90 mL/min   Comment: (NOTE)     The eGFR has been calculated using the CKD EPI equation.     This calculation has not been validated in all clinical situations.     eGFR's persistently <90 mL/min signify possible Chronic Kidney     Disease.  CBC WITH DIFFERENTIAL     Status: Abnormal   Collection Time    08/15/13  6:16 AM      Result Value Ref Range   WBC 7.0  4.0 - 10.5 K/uL   RBC 4.51  4.22 - 5.81 MIL/uL   Hemoglobin 13.9  13.0 - 17.0 g/dL   HCT 40.2   39.0 - 52.0 %   MCV 89.1  78.0 - 100.0 fL   MCH 30.8  26.0 - 34.0 pg   MCHC 34.6  30.0 - 36.0 g/dL   RDW 12.4  11.5 - 15.5 %   Platelets 200  150 - 400 K/uL   Neutrophils Relative % 64  43 - 77 %   Neutro Abs 4.4  1.7 - 7.7 K/uL   Lymphocytes Relative 18  12 - 46 %   Lymphs Abs 1.2  0.7 - 4.0 K/uL   Monocytes Relative 13 (*) 3 - 12 %   Monocytes Absolute 0.9  0.1 - 1.0 K/uL   Eosinophils Relative 5  0 - 5 %   Eosinophils Absolute 0.3  0.0 - 0.7 K/uL   Basophils Relative 1  0 - 1 %   Basophils Absolute 0.1  0.0 - 0.1 K/uL  COMPREHENSIVE METABOLIC PANEL     Status: Abnormal   Collection Time    08/15/13  6:16 AM  Result Value Ref Range   Sodium 142  137 - 147 mEq/L   Potassium 4.3  3.7 - 5.3 mEq/L   Chloride 107  96 - 112 mEq/L   CO2 24  19 - 32 mEq/L   Glucose, Bld 97  70 - 99 mg/dL   BUN 10  6 - 23 mg/dL   Creatinine, Ser 0.73  0.50 - 1.35 mg/dL   Calcium 9.2  8.4 - 10.5 mg/dL   Total Protein 6.3  6.0 - 8.3 g/dL   Albumin 3.4 (*) 3.5 - 5.2 g/dL   AST 18  0 - 37 U/L   ALT 9  0 - 53 U/L   Alkaline Phosphatase 72  39 - 117 U/L   Total Bilirubin 0.4  0.3 - 1.2 mg/dL   GFR calc non Af Amer 90 (*) >90 mL/min   GFR calc Af Amer >90  >90 mL/min   Comment: (NOTE)     The eGFR has been calculated using the CKD EPI equation.     This calculation has not been validated in all clinical situations.     eGFR's persistently <90 mL/min signify possible Chronic Kidney     Disease.      General: No acute distress Mood and affect are inappropriate, constantly asking when he will return to group home Heart: Regular rate and rhythm no rubs murmurs or extra sounds Lungs: Clear to auscultation, breathing unlabored, no rales or wheezes Abdomen: Positive bowel sounds, soft nontender to palpation, nondistended Extremities: No clubbing, cyanosis, or edema Skin: No evidence of breakdown, no evidence of rash Neurologic: Cranial nerves II through XII intact, motor strength is 4/5 in bilateral  deltoid, bicep, tricep, grip, hip flexor, knee extensors, ankle dorsiflexor and plantar flexor Sensory exam unable to assess secondary to MS Cerebellar exam normal finger to nose to finger as well as heel to shin in bilateral upper and lower extremities Musculoskeletal: Full range of motion in all 4 extremities. No joint swelling   Patient Vitals for the past 24 hrs:  BP Temp Temp src Pulse Resp SpO2  08/17/13 0618 123/80 mmHg 97.9 F (36.6 C) Oral 67 18 95 %  08/16/13 1634 111/72 mmHg 97.8 F (36.6 C) Oral 65 19 96 %     Intake/Output Summary (Last 24 hours) at 08/17/13 0945 Last data filed at 08/17/13 0756  Gross per 24 hour  Intake    360 ml  Output      0 ml  Net    360 ml     Assessment/Plan: 1. 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  2. DVT Prophylaxis/Anticoagulation: Subcutaneous Lovenox. Monitor platelet counts any signs of bleeding  3. Pain Management: Neurontin 600 mg 3 times a day, Tylenol. Monitor with increased mobility  4. Mood/paranoid schizophrenia. Cogentin 0.5 mg twice a day, Paxil 20 mg daily, Risperdal 1 mg every a.m. 2 mg each bedtime. Will discuss baseline cognition with group home. Bed alarm for safety   5.  Hypothyroidism. Synthroid. TSH level 0.411  6.  Hyperlipidemia. Zocor    LOS (Days) 3 A FACE TO FACE EVALUATION WAS PERFORMED  Nyoka Cowden 08/17/2013, 9:45 AM

## 2013-08-18 ENCOUNTER — Inpatient Hospital Stay (HOSPITAL_COMMUNITY): Payer: Medicare Other | Admitting: Speech Pathology

## 2013-08-18 ENCOUNTER — Inpatient Hospital Stay (HOSPITAL_COMMUNITY): Payer: Medicare Other | Admitting: Occupational Therapy

## 2013-08-18 ENCOUNTER — Inpatient Hospital Stay (HOSPITAL_COMMUNITY): Payer: Medicare Other | Admitting: Physical Therapy

## 2013-08-18 DIAGNOSIS — I6350 Cerebral infarction due to unspecified occlusion or stenosis of unspecified cerebral artery: Secondary | ICD-10-CM

## 2013-08-18 NOTE — Progress Notes (Signed)
Occupational Therapy Session Note  Patient Details  Name: Scott FossaJohn G [name] MRN: [id number] Date of Birth: 10/14/1940  Today's Date: 08/18/2013 Time: 0730-0818 Time Calculation (min): 48 min  Short Term Goals: Week 1:  OT Short Term Goal 1 (Week 1): STG = LTGs due to ELOS  Skilled Therapeutic Interventions/Progress Updates:    1) Engaged in ADL retraining with focus on functional mobility, transfers, and safety with self-care tasks of bathing and dressing.  Pt finishing breakfast upon arrival, verbal cues for safety with swallowing as pt with quick pace while eating and cues to slow down.  Ambulated to bathroom to complete bathing at shower level, despite shower chair in shower pt completed bathing at standing level with close supervision for balance, pt did not wash feet this session but completed rest of bathing with min cues for thoroughness.  Pt continues to have no clothes therefore donned brief, socks, and hospital gown with setup assist.  Oral hygiene completed in standing at sink.  All mobility throughout session completed with HHA as pt with RLE weakness and heightened fear of falling.  Pt returned to bed towards end of session and requested to remain in bed as he was tired.  Pt left with sitter in room.  Therapy Documentation Precautions:  Precautions Precautions: Fall Precaution Comments: Hx of mental retardation and paranoid schizophrenia, impulsive Restrictions Weight Bearing Restrictions: No General: General Amount of Missed OT Time (min): 12 Minutes Pain:  Pt with no c/o pain  See FIM for current functional status  Therapy/Group: Individual Therapy  Rosalio LoudHOXIE, Abiha Lukehart 08/18/2013, 8:26 AM

## 2013-08-18 NOTE — Progress Notes (Signed)
Speech Language Pathology Daily Session Note  Patient Details  Name: Scott [name] MRN: [id number] Date of Birth: 11/10/1940  Today's Date: 08/18/2013 Time: 1502-1530 Time Calculation (min): 28 min  Short Term Goals: Week 1: SLP Short Term Goal 1 (Week 1): STG=LTG  Skilled Therapeutic Interventions: Skilled treatment session focused on addressing dysphagia goals. SLP facilitated session by providing trials of regular textures and thin liquids via cup with Max assist verbal cues for pacing.  Patient refused regular textures and SLP was able to fade cues to in BrielleMin assist level during task.  Recommend SLP observe with meal to ensure safety carryover and then SLP to discharge.     FIM:  Comprehension Comprehension Mode: Auditory Comprehension: 2-Understands basic 25 - 49% of the time/requires cueing 51 - 75% of the time Expression Expression: 5-Expresses basic needs/ideas: With extra time/assistive device Social Interaction Social Interaction: 2-Interacts appropriately 25 - 49% of time - Needs frequent redirection. Problem Solving Problem Solving: 2-Solves basic 25 - 49% of the time - needs direction more than half the time to initiate, plan or complete simple activities Memory Memory: 1-Recognizes or recalls less than 25% of the time/requires cueing greater than 75% of the time FIM - Eating Eating Activity: 5: Needs verbal cues/supervision  Pain Pain Assessment Pain Assessment: No/denies pain  Therapy/Group: Individual Therapy  Charlane FerrettiMelissa Vineet Kinney, M.A., CCC-SLP [phone]  Della Scrivener 08/18/2013, 3:44 PM

## 2013-08-18 NOTE — Progress Notes (Signed)
Subjective/Complaints: 73 y.o. right-handed male with history significant for paranoid schizophrenia/mental retardation and currently living in a group home. Presented 08/11/2013 with right-sided weakness. MRI of the brain shows small area of cortically based a restricted diffusion suspect it in the medial left motor strip. Carotid Dopplers with no ICA stenosis. Echocardiogram with ejection fraction of 60% grade 1 diastolic dysfunction.   ROS limited due to cog/ling issues. Sitter still in room.  Objective: Vital Signs: Blood pressure 117/76, pulse 102, temperature 98.1 F (36.7 C), temperature source Oral, resp. rate 16, SpO2 92.00%. No results found. No results found for this or any previous visit (from the past 72 hour(s)).    General: No acute distress Mood and affect are inappropriate, engrossed with eating ice cream Heart: Regular rate and rhythm no rubs murmurs or extra sounds Lungs: Clear to auscultation, breathing unlabored, no rales or wheezes Abdomen: Positive bowel sounds, soft nontender to palpation, nondistended Extremities: No clubbing, cyanosis, or edema Skin: No evidence of breakdown, no evidence of rash Neurologic: Cranial nerves II through XII intact, motor strength is 4/5 in bilateral deltoid, bicep, tricep, grip, hip flexor, knee extensors, ankle dorsiflexor and plantar flexor Sensory exam unable to assess secondary to MS Cerebellar exam normal finger to nose to finger as well as heel to shin in bilateral upper and lower extremities Musculoskeletal: Full range of motion in all 4 extremities. No joint swelling   Assessment/Plan: 1. 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 Physiatrist is providing close team supervision and 24 hour management of active medical problems listed below. Physiatrist and rehab team  continue to assess barriers to discharge/monitor patient progress toward functional and medical goals.   FIM: FIM - Bathing Bathing Steps Patient Completed: Chest;Right Arm;Left Arm;Abdomen;Right upper leg;Left upper leg;Front perineal area;Buttocks Bathing: 4: Min-Patient completes 8-9 44f 10 parts or 75+ percent  FIM - Upper Body Dressing/Undressing Upper body dressing/undressing: 0: Wears gown/pajamas-no public clothing FIM - Lower Body Dressing/Undressing Lower body dressing/undressing steps patient completed: Don/Doff right sock;Don/Doff left sock;Thread/unthread right underwear leg;Thread/unthread left underwear leg;Pull underwear up/down Lower body dressing/undressing: 4: Steadying Assist  FIM - Toileting Toileting steps completed by patient: Adjust clothing prior to toileting;Performs perineal hygiene;Adjust clothing after toileting Toileting Assistive Devices: Grab bar or rail for support Toileting: 4: Steadying assist  FIM - Diplomatic Services operational officer Devices: Grab bars Toilet Transfers: 4-To toilet/BSC: Min A (steadying Pt. > 75%);4-From toilet/BSC: Min A (steadying Pt. > 75%)  FIM - Bed/Chair Transfer Bed/Chair Transfer: 4: Bed > Chair or W/C: Min A (steadying Pt. > 75%);4: Chair or W/C > Bed: Min A (steadying Pt. > 75%)  FIM - Locomotion: Wheelchair Locomotion: Wheelchair: 0: Activity did not occur FIM - Locomotion: Ambulation Ambulation/Gait Assistance: 4: Min assist Locomotion: Ambulation: 4: Travels 150 ft or more with minimal assistance (Pt.>75%)  Comprehension Comprehension Mode: Auditory Comprehension: 2-Understands basic 25 - 49% of the time/requires cueing 51 - 75% of the time  Expression Expression Mode: Verbal Expression: 5-Expresses basic needs/ideas: With extra time/assistive device  Social Interaction Social Interaction: 2-Interacts appropriately 25 - 49% of time - Needs frequent redirection.  Problem Solving Problem Solving:  1-Solves basic less than 25% of the time - needs direction nearly all the time or does not effectively solve problems and may need a restraint for safety  Memory Memory: 1-Recognizes or recalls less than 25% of the time/requires  cueing greater than 75% of the time  Medical Problem List and Plan:  1. 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 DVT Prophylaxis/Anticoagulation: Subcutaneous Lovenox. Monitor platelet counts any signs of bleeding  3. Pain Management: Neurontin 600 mg 3 times a day, Tylenol. Monitor with increased mobility  4. Mood/paranoid schizophrenia. Cogentin 0.5 mg twice a day, Paxil 20 mg daily, Risperdal 1 mg every a.m. 2 mg each bedtime. Will discuss baseline cognition with group home.   -sitter still in room---will speak with team regarding the ongoing need 5. Neuropsych: This patient is not capable of making decisions on his own behalf.  6. Hypothyroidism. Synthroid. TSH level 0.411  7. Hyperlipidemia. Zocor    LOS (Days) 4 A FACE TO FACE EVALUATION WAS PERFORMED  [name],Scott T 08/18/2013, 9:24 AM

## 2013-08-18 NOTE — Progress Notes (Signed)
Occupational Therapy Note  Patient Details  Name: Scott FossaJohn G [name] MRN: [id number] Date of Birth: 10/17/1940 Today's Date: 08/18/2013  Pt missed 45 mins skilled OT treatment session secondary to refusal and fatigue.  Pt in bed upon arrival and when encouraged to participate in session, pt reports "not now" upon further questioning and encouragement pt just shaking head and reports time to take a nap.  Notified RN of refusal.  Pt left in bed with sitter in room.  Rosalio LoudHOXIE, Mayda Shippee 08/18/2013, 2:40 PM

## 2013-08-18 NOTE — Progress Notes (Signed)
Physical Therapy Session Note  Patient Details  Name: Scott FossaJohn G [name] MRN: [id number] Date of Birth: 06/27/1940  Today's Date: 08/18/2013 Time: 0905-1002 Time Calculation (min): 57 min  Short Term Goals: Week 1:  PT Short Term Goal 1 (Week 1): = LTG secondary to short LOS  Skilled Therapeutic Interventions/Progress Updates:   Pt resting in the bed.  Pt denies pain.  With cueing pt assisted therapist with functional activity of making up his bed in standing, side stepping around the bed and reaching out of BOS to tuck in blankets.  Pt ambulated >200' on unit with one HHA (min A) while carrying radio with other hand.  Attempted to engage pt in functional activities of getting in/out of simulated car and stair negotiation.  Pt initially refusing to engage in activities. Therapist had to un-plug and carry radio in order to motivate pt to participate.  Pt was able to enter and exit car with supervision and performed stair negotiation up/down 4 stairs and ramp x 2 reps with bilat UE support on rails with min A during alternating sequencing with max encouragement.  Continued to explain to pt why he was in the hospital and why he is falling.  Also explained to pt practicing different things in order to be able to go home; with explanation pt more willing to participate.  Also performed bilat LE strengthening exercises with 4lb weight on each ankle while sitting and listening to music with max verbal and visual cues to complete exercises.  Performed dynamic gait and single limb support training with gait with alternating LE ball kicking down hallway and while changing direction with min-mod A for balance.  Returned to room and to sitting EOB while pt supervised while drinking a Coke.    Therapy Documentation Precautions:  Precautions Precautions: Fall Precaution Comments: Hx of mental retardation and paranoid schizophrenia, impulsive Restrictions Weight Bearing Restrictions: No Pain: Pain Assessment Pain  Assessment: No/denies pain Pain Score: 0-No pain  See FIM for current functional status  Therapy/Group: Individual Therapy  Edman CircleHall, Aubryanna Nesheim Faucette 08/18/2013, 10:07 AM

## 2013-08-19 ENCOUNTER — Inpatient Hospital Stay (HOSPITAL_COMMUNITY): Payer: Medicare Other | Admitting: Occupational Therapy

## 2013-08-19 ENCOUNTER — Inpatient Hospital Stay (HOSPITAL_COMMUNITY): Payer: Medicare Other

## 2013-08-19 ENCOUNTER — Ambulatory Visit (HOSPITAL_COMMUNITY): Payer: Medicare Other | Admitting: Speech Pathology

## 2013-08-19 DIAGNOSIS — I6350 Cerebral infarction due to unspecified occlusion or stenosis of unspecified cerebral artery: Secondary | ICD-10-CM

## 2013-08-19 NOTE — Progress Notes (Signed)
Speech Language Pathology Discharge Summary  Patient Details  Name: [name] MRN: [id number] Date of Birth: 18-Apr-1941  Today's Date: 08/19/2013  Skilled Therapeutic Interventions:  Amount of Missed SLP Time (min): 30 Minutes.  Missed Time Reason: Patient unwilling to participate due to already having consumed breakfast with sitter.  Despite SLP not being able to observe patient while consuming regular textures he appears to be tolerating and as a result no further skilled SLP services are warranted at this time.   Patient has met 1 of 1 long term goals.  Patient to discharge at [facility] level.  Reasons goals not met: n/a   Clinical Impression/Discharge Summary: Patient met 1 out of 1 long term goals during CIR stay due to toleration of diet.  Patient consumes PO at fast rate that impacts his safety; however, per nursing and sitter report he is consuming them with full supervision and per chart review he appears to be tolerating current diet of regular textures and thin liquids.  During skilled sessions with SLP patient was able to follow directions and slow rate of intake as a result, it is recommended that patient continue with current diet orders with continued full supervision for cues to increase overall safety with PO intake.   Care Partner:  Caregiver Able to Provide Assistance: Other (comment) (unknown but patient will require Min assist at discharge)  Type of Caregiver Assistance: Physical;Cognitive  Recommendation:  24 hour supervision/assistance  Rationale for SLP Follow Up: Other (comment) (n/a)   Equipment: none   Reasons for discharge: Treatment goals met   Patient/Family Agrees with Progress Made and Goals Achieved:  (pt unable to agree no family present)   See FIM for current functional status  Carmelia Roller., CCC-SLP [phone]  [place] 08/19/2013, 8:48 AM

## 2013-08-19 NOTE — Progress Notes (Signed)
Occupational Therapy Session Note  Patient Details  Name: Scott FossaJohn G [name] MRN: [id number] Date of Birth: 11/27/1940  Today's Date: 08/19/2013 Time: 1035-1130 and [id number] Time Calculation (min): 55 min and 40 min  Short Term Goals: No short term goals set  Skilled Therapeutic Interventions/Progress Updates:    1) Engaged in ADL retraining with focus on functional mobility, balance, and safety with self-care tasks of bathing and dressing.  Pt received from sitter and ambulated > 150 feet to retrieve clothing from pt supply closet as group home still has not brought clothing.  Pt ambulated with hand held assist for confidence as he is fearful of falling.  Upon return to room pt completed bathing at sit > stand level with standing for bathing with exception of sitting to wash feet, verbal cues for thoroughness as pt attempted to just rinse off and not use soap.  Verbal cues for safety with LB dressing, encouraging pt to sit to don brief and pants.  Ambulated to RN station to retrieve drink, again min assist/HHA with ambulation due to fear of falling.  Pt with repeated questions regarding when he will return to group home and daycare center.  2) Upon arrival, pt recalled working with this clinician this am.  Willing to participate in treatment session with pt requesting to walk outside.  Ambulated with min assist - close supervision with verbal cues for safe gait speed as pt tends to lose balance when ambulating faster.  Ambulated to main entrance and outside with 2 rest breaks.  Discussed typical routine at group home and daycare with pt just stating he would walk around and listen to his radio.  Pt continues to ask repeatedly when he will return to his group home.  Recommend pt have min assist with ambulation as he is unsteady and unsafe with ambulation but is unable to cognitively comprehend use of AD.  Therapy Documentation Precautions:  Precautions Precautions: Fall Precaution Comments: Hx of  mental retardation and paranoid schizophrenia, impulsive Restrictions Weight Bearing Restrictions: No Pain:  Pt with no c/o pain  See FIM for current functional status  Therapy/Group: Individual Therapy  Rosalio LoudHOXIE, Brittian Renaldo 08/19/2013, 12:11 PM

## 2013-08-19 NOTE — Progress Notes (Signed)
Social Work Patient ID: Scott FossaJohn G [name], male   DOB: 10/15/1940, 73 y.o.   MRN: [id number] Have left a message for [facility]-Director of Group Home to inform pt ready for discharge this week.  Trying to find out If facility can provide supervision level to pt.  Pt will need hand held assist due to assistive device is a safety issue for him. Will await return call.

## 2013-08-19 NOTE — Progress Notes (Signed)
Subjective/Complaints: 73 y.o. right-handed male with history significant for paranoid schizophrenia/mental retardation and currently living in a group home. Presented 08/11/2013 with right-sided weakness. MRI of the brain shows small area of cortically based a restricted diffusion suspect it in the medial left motor strip. Carotid Dopplers with no ICA stenosis. Echocardiogram with ejection fraction of 60% grade 1 diastolic dysfunction.   "when am i going back to the group home?" ROS limited due to cog/ling issues. Sitter still in room.  Objective: Vital Signs: Blood pressure 132/80, pulse 79, temperature 97.8 F (36.6 C), temperature source Oral, resp. rate 20, SpO2 95.00%. No results found. No results found for this or any previous visit (from the past 72 hour(s)).    General: No acute distress Mood and affect are inappropriate, engrossed with eating ice cream Heart: Regular rate and rhythm no rubs murmurs or extra sounds Lungs: Clear to auscultation, breathing unlabored, no rales or wheezes Abdomen: Positive bowel sounds, soft nontender to palpation, nondistended Extremities: No clubbing, cyanosis, or edema Skin: No evidence of breakdown, no evidence of rash Neurologic: Cranial nerves II through XII intact, motor strength is 4/5 in bilateral deltoid, bicep, tricep, grip, hip flexor, knee extensors, ankle dorsiflexor and plantar flexor Sensory exam unable to assess secondary to MS Cerebellar exam normal finger to nose to finger as well as heel to shin in bilateral upper and lower extremities Musculoskeletal: Full range of motion in all 4 extremities. No joint swelling   Assessment/Plan: 1. 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 Physiatrist is providing close team supervision and 24 hour management of active medical problems  listed below. Physiatrist and rehab team continue to assess barriers to discharge/monitor patient progress toward functional and medical goals.  Still has sitter. Has low bed---discuss with team   FIM: FIM - Bathing Bathing Steps Patient Completed: Chest;Right Arm;Left Arm;Abdomen;Right upper leg;Left upper leg;Front perineal area;Buttocks Bathing: 4: Min-Patient completes 8-9 49f 10 parts or 75+ percent  FIM - Upper Body Dressing/Undressing Upper body dressing/undressing: 0: Wears gown/pajamas-no public clothing FIM - Lower Body Dressing/Undressing Lower body dressing/undressing steps patient completed: Don/Doff right sock;Don/Doff left sock;Thread/unthread right underwear leg;Thread/unthread left underwear leg;Pull underwear up/down Lower body dressing/undressing: 4: Steadying Assist  FIM - Toileting Toileting steps completed by patient: Adjust clothing prior to toileting;Performs perineal hygiene;Adjust clothing after toileting Toileting Assistive Devices: Grab bar or rail for support Toileting: 4: Steadying assist  FIM - Diplomatic Services operational officer Devices: Grab bars Toilet Transfers: 4-To toilet/BSC: Min A (steadying Pt. > 75%);4-From toilet/BSC: Min A (steadying Pt. > 75%)  FIM - Bed/Chair Transfer Bed/Chair Transfer: 6: Supine > Sit: No assist;6: Sit > Supine: No assist;4: Bed > Chair or W/C: Min A (steadying Pt. > 75%);4: Chair or W/C > Bed: Min A (steadying Pt. > 75%)  FIM - Locomotion: Wheelchair Locomotion: Wheelchair: 0: Activity did not occur FIM - Locomotion: Ambulation Ambulation/Gait Assistance: 4: Min assist Locomotion: Ambulation: 4: Travels 150 ft or more with minimal assistance (Pt.>75%)  Comprehension Comprehension Mode: Auditory Comprehension: 2-Understands basic 25 - 49% of the time/requires cueing 51 - 75% of the time  Expression Expression Mode: Verbal Expression: 5-Expresses basic needs/ideas: With extra time/assistive device  Social  Interaction Social Interaction: 2-Interacts appropriately 25 - 49% of time - Needs frequent redirection.  Problem Solving Problem Solving: 2-Solves basic 25 - 49% of the time - needs direction more  than half the time to initiate, plan or complete simple activities  Memory Memory: 1-Recognizes or recalls less than 25% of the time/requires cueing greater than 75% of the time  Medical Problem List and Plan:  1. 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 DVT Prophylaxis/Anticoagulation: Subcutaneous Lovenox. Monitor platelet counts any signs of bleeding  3. Pain Management: Neurontin 600 mg 3 times a day, Tylenol. Monitor with increased mobility  4. Mood/paranoid schizophrenia. Cogentin 0.5 mg twice a day, Paxil 20 mg daily, Risperdal 1 mg every a.m. 2 mg each bedtime. Will discuss baseline cognition with group home.   -sitter for safety for now 5. Neuropsych: This patient is not capable of making decisions on his own behalf.  6. Hypothyroidism. Synthroid. TSH level 0.411  7. Hyperlipidemia. Zocor    LOS (Days) 5 A FACE TO FACE EVALUATION WAS PERFORMED  SWARTZ,ZACHARY T 08/19/2013, 9:09 AM

## 2013-08-19 NOTE — Progress Notes (Signed)
Physical Therapy Session Note  Patient Details  Name: Marya FossaJohn G Gangl MRN: [id number] Date of Birth: 03/17/1941  Today's Date: 08/19/2013 Time: [id number] Time Calculation (min): 40 min  Short Term Goals: Week 1:  PT Short Term Goal 1 (Week 1): = LTG secondary to short LOS  Skilled Therapeutic Interventions/Progress Updates:  1:1. Pt received supine in bed, ready for therapy. Focus this session on dynamic standing balance, ambulation and safety during functional tasks. Pt practiced making bed in therapy apartment, req mod cues for seq and safety. Pt choosing to step on items on floor (pillow, comforter etc.) vs. Going around them. Reviewed fall transfers for safety in home environment. Ambulation 150'x4 this session w/ mild LOB to anterior/L directions. Attempted use of NuStep, however, pt w/ poor sustained attention to task. Overall, pt reqmin-occasional mod A due to impulsivity in standing. Mod cueing for redirect to tasks throughout session due to perseveration regarding d/c plans, attempted to engage pt in other therapeutic tasks but stating "not now, I'm tired." Pt supine in bed at end of session w/ all needs in reach, sitter in room, bed in lowest position. Pt missed 15min at end of session due to fatigue.   Therapy Documentation Precautions:  Precautions Precautions: Fall Precaution Comments: Hx of mental retardation and paranoid schizophrenia, impulsive Restrictions Weight Bearing Restrictions: No  See FIM for current functional status  Therapy/Group: Individual Therapy  Denzil HughesKing, Esraa Seres S 08/19/2013, 2:23 PM

## 2013-08-20 ENCOUNTER — Encounter (HOSPITAL_COMMUNITY): Payer: Medicare Other

## 2013-08-20 ENCOUNTER — Inpatient Hospital Stay (HOSPITAL_COMMUNITY): Payer: Medicare Other | Admitting: Physical Therapy

## 2013-08-20 ENCOUNTER — Inpatient Hospital Stay (HOSPITAL_COMMUNITY): Payer: Medicare Other | Admitting: Occupational Therapy

## 2013-08-20 DIAGNOSIS — I6350 Cerebral infarction due to unspecified occlusion or stenosis of unspecified cerebral artery: Secondary | ICD-10-CM

## 2013-08-20 NOTE — Progress Notes (Signed)
Social Work Patient ID: Scott FossaJohn G [name], male   DOB: 10/20/1940, 73 y.o.   MRN: [id number] [facility] to inform of pt;s readiness for discharge tomorrow.  She needs a new FL2 and if any new medicines. Prescriptions for.  Will also set up HHPT no preference on agencies.  Work toward discharge tomorrow.  Pt's sister-Susan is aware.

## 2013-08-20 NOTE — Discharge Instructions (Signed)
Inpatient Rehab Discharge Instructions  Scott [name] Discharge date and time: No discharge date for patient encounter.   Activities/Precautions/ Functional Status: Activity: activity as tolerated Diet: regular diet Wound Care: none needed Functional status:  ___ No restrictions     ___ Walk up steps independently _x__ 24/7 supervision/assistance   ___ Walk up steps with assistance ___ Intermittent supervision/assistance  ___ Bathe/dress independently ___ Walk with walker     ___ Bathe/dress with assistance ___ Walk Independently    ___ Shower independently _x__ Walk with assistance    ___ Shower with assistance ___ No alcohol     ___ Return to work/school ________  Special Instructions:    COMMUNITY REFERRALS UPON DISCHARGE:    Home Health:   PT &  OT     Agency:[facility] [phone] Date of last service:08/21/2013   Medical Equipment/Items Ordered:NO NEEDS   Other:RETURN TO GROUP HOME     My questions have been answered and I understand these instructions. I will adhere to these goals and the provided educational materials after my discharge from the hospital.  Patient/Caregiver Signature _______________________________ Date __________  Clinician Signature _______________________________________ Date __________  Please bring this form and your medication list with you to all your follow-up doctor's appointments.  STROKE/TIA DISCHARGE INSTRUCTIONS SMOKING Cigarette smoking nearly doubles your risk of having a stroke & is the single most alterable risk factor  If you smoke or have smoked in the last 12 months, you are advised to quit smoking for your health.  Most of the excess cardiovascular risk related to smoking disappears within a year of stopping.  Ask you doctor about anti-smoking medications  [facility] Quit Line: 1-800-QUIT NOW  Free Smoking Cessation Classes [phone]  CHOLESTEROL Know your levels; limit fat & cholesterol in your diet  Lipid Panel       Component Value Date/Time   CHOL 126 08/12/2013 0735   TRIG 48 08/12/2013 0735   HDL 44 08/12/2013 0735   CHOLHDL 2.9 08/12/2013 0735   VLDL 10 08/12/2013 0735   LDLCALC 72 08/12/2013 0735      Many patients benefit from treatment even if their cholesterol is at goal.  Goal: Total Cholesterol (CHOL) less than 160  Goal:  Triglycerides (TRIG) less than 150  Goal:  HDL greater than 40  Goal:  LDL (LDLCALC) less than 100   BLOOD PRESSURE American Stroke Association blood pressure target is less that 120/80 mm/Hg  Your discharge blood pressure is:  BP: 99/65 mmHg  Monitor your blood pressure  Limit your salt and alcohol intake  Many individuals will require more than one medication for high blood pressure  DIABETES (A1c is a blood sugar average for last 3 months) Goal HGBA1c is under 7% (HBGA1c is blood sugar average for last 3 months)  Diabetes: No known diagnosis of diabetes    Lab Results  Component Value Date   HGBA1C 5.9* 08/12/2013     Your HGBA1c can be lowered with medications, healthy diet, and exercise.  Check your blood sugar as directed by your physician  Call your physician if you experience unexplained or low blood sugars.  PHYSICAL ACTIVITY/REHABILITATION Goal is 30 minutes at least 4 days per week  Activity: Increase activity slowly, Therapies: Physical Therapy: Home Health Return to work:   Activity decreases your risk of heart attack and stroke and makes your heart stronger.  It helps control your weight and blood pressure; helps you relax and can improve your mood.  Participate in  a regular exercise program.  Talk with your doctor about the best form of exercise for you (dancing, walking, swimming, cycling).  DIET/WEIGHT Goal is to maintain a healthy weight  Your discharge diet is: Cardiac  liquids Your height is:    Your current weight is: Weight: 160 lb (72.576 kg) Your Body Mass Index (BMI) is:     Following the type of diet specifically  designed for you will help prevent another stroke.  Your goal weight range is:    Your goal Body Mass Index (BMI) is 19-24.  Healthy food habits can help reduce 3 risk factors for stroke:  High cholesterol, hypertension, and excess weight.  RESOURCES Stroke/Support Group:  Call [phone]   STROKE EDUCATION PROVIDED/REVIEWED AND GIVEN TO PATIENT Stroke warning signs and symptoms How to activate emergency medical system (call 911). Medications prescribed at discharge. Need for follow-up after discharge. Personal risk factors for stroke. Pneumonia vaccine given:  Flu vaccine given:  My questions have been answered, the writing is legible, and I understand these instructions.  I will adhere to these goals & educational materials that have been provided to me after my discharge from the hospital.

## 2013-08-20 NOTE — Discharge Summary (Signed)
  Discharge summary job # [id number]

## 2013-08-20 NOTE — Progress Notes (Signed)
Subjective/Complaints: 73 y.o. right-handed male with history significant for paranoid schizophrenia/mental retardation and currently living in a group home. Presented 08/11/2013 with right-sided weakness. MRI of the brain shows small area of cortically based a restricted diffusion suspect it in the medial left motor strip. Carotid Dopplers with no ICA stenosis. Echocardiogram with ejection fraction of 60% grade 1 diastolic dysfunction.   Uneventful night ROS limited due to cog/ling issues. Sitter still in room.  Objective: Vital Signs: Blood pressure 99/65, pulse 70, temperature 98.2 F (36.8 C), temperature source Oral, resp. rate 18, weight 72.576 kg (160 lb), SpO2 92.00%. No results found. No results found for this or any previous visit (from the past 72 hour(s)).    General: No acute distress Mood and affect are inappropriate, engrossed with eating ice cream Heart: Regular rate and rhythm no rubs murmurs or extra sounds Lungs: Clear to auscultation, breathing unlabored, no rales or wheezes Abdomen: Positive bowel sounds, soft nontender to palpation, nondistended Extremities: No clubbing, cyanosis, or edema Skin: No evidence of breakdown, no evidence of rash Neurologic: Cranial nerves II through XII intact, motor strength is 4/5 in bilateral deltoid, bicep, tricep, grip, hip flexor, knee extensors, ankle dorsiflexor and plantar flexor Sensory exam unable to assess secondary to MS Cerebellar exam normal finger to nose to finger as well as heel to shin in bilateral upper and lower extremities Musculoskeletal: Full range of motion in all 4 extremities. No joint swelling   Assessment/Plan: 1. 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 Physiatrist is providing close team supervision and 24 hour management of active medical problems  listed below. Physiatrist and rehab team continue to assess barriers to discharge/monitor patient progress toward functional and medical goals.  Low bed, sitter--dw team today   FIM: FIM - Bathing Bathing Steps Patient Completed: Chest;Right Arm;Left Arm;Abdomen;Right upper leg;Left upper leg;Front perineal area;Buttocks;Right lower leg (including foot);Left lower leg (including foot) Bathing: 5: Supervision: Safety issues/verbal cues  FIM - Upper Body Dressing/Undressing Upper body dressing/undressing steps patient completed: Thread/unthread right sleeve of pullover shirt/dresss;Thread/unthread left sleeve of pullover shirt/dress;Put head through opening of pull over shirt/dress;Pull shirt over trunk Upper body dressing/undressing: 5: Set-up assist to: Obtain clothing/put away FIM - Lower Body Dressing/Undressing Lower body dressing/undressing steps patient completed: Thread/unthread right underwear leg;Thread/unthread left underwear leg;Pull underwear up/down;Thread/unthread right pants leg;Thread/unthread left pants leg;Pull pants up/down;Don/Doff left sock;Don/Doff right sock Lower body dressing/undressing: 5: Supervision: Safety issues/verbal cues  FIM - Toileting Toileting steps completed by patient: Adjust clothing prior to toileting;Performs perineal hygiene;Adjust clothing after toileting Toileting Assistive Devices: Grab bar or rail for support Toileting: 5: Supervision: Safety issues/verbal cues  FIM - Diplomatic Services operational officer Devices: Grab bars Toilet Transfers: 4-To toilet/BSC: Min A (steadying Pt. > 75%);4-From toilet/BSC: Min A (steadying Pt. > 75%)  FIM - Bed/Chair Transfer Bed/Chair Transfer: 6: Supine > Sit: No assist;6: Sit > Supine: No assist;4: Bed > Chair or W/C: Min A (steadying Pt. > 75%);4: Chair or W/C > Bed: Min A (steadying Pt. > 75%)  FIM - Locomotion: Wheelchair Locomotion: Wheelchair: 0: Activity did not occur FIM - Locomotion:  Ambulation Ambulation/Gait Assistance: 4: Min assist;3: Mod assist Locomotion: Ambulation: 3: Travels 150 ft or more with moderate assistance (Pt: 50 - 74%)  Comprehension Comprehension Mode: Auditory Comprehension: 1-Understands basic less than 25% of the time/requires cueing 75% of the time  Expression Expression Mode:  Verbal Expression: 5-Expresses basic needs/ideas: With extra time/assistive device  Social Interaction Social Interaction: 2-Interacts appropriately 25 - 49% of time - Needs frequent redirection.  Problem Solving Problem Solving: 2-Solves basic 25 - 49% of the time - needs direction more than half the time to initiate, plan or complete simple activities  Memory Memory: 1-Recognizes or recalls less than 25% of the time/requires cueing greater than 75% of the time  Medical Problem List and Plan:  1. 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 DVT Prophylaxis/Anticoagulation: Subcutaneous Lovenox. Monitor platelet counts any signs of bleeding  3. Pain Management: Neurontin 600 mg 3 times a day, Tylenol. Monitor with increased mobility  4. Mood/paranoid schizophrenia. Cogentin 0.5 mg twice a day, Paxil 20 mg daily, Risperdal 1 mg every a.m. 2 mg each bedtime. Will discuss baseline cognition with group home.   -sitter for safety for now--reviewing with team 5. Neuropsych: This patient is not capable of making decisions on his own behalf.  6. Hypothyroidism. Synthroid. TSH level 0.411  7. Hyperlipidemia. Zocor    LOS (Days) 6 A FACE TO FACE EVALUATION WAS PERFORMED  SWARTZ,ZACHARY T 08/20/2013, 9:21 AM

## 2013-08-20 NOTE — Discharge Summary (Signed)
NAME:  Scott [name], Scott [name]                 ACCOUNT NO.:  [id number]  MEDICAL RECORD NO.:  [id number]  LOCATION:  4W16C                        FACILITY:  [facility]  PHYSICIAN:  Erick ColaceAndrew E. Kirsteins, M.D.DATE OF BIRTH:  1940-08-30  DATE OF ADMISSION:  08/14/2013 DATE OF DISCHARGE:  08/21/2013                              DISCHARGE SUMMARY   DISCHARGE DIAGNOSES: 1. Left CVA, felt to be embolic. 2. Subcutaneous Lovenox for deep venous thrombosis prophylaxis. 3. Pain management. 4. Paranoid schizophrenia. 5. Hypothyroidism. 6. Hyperlipidemia.  HISTORY OF PRESENT ILLNESS:  This is a 73 year old right-handed male, history of significant paranoid schizophrenia and mental retardation currently living in a group home.  Presented on August 11, 2013, with right-sided weakness.  MRI of the brain showed small areas of cortically based restricted diffusion suspect medial left motor strip.  Carotid Dopplers with no ICA stenosis.  Echocardiogram with ejection fraction of 60% grade 1 diastolic dysfunction.  Neurology Service followup.  The patient did not receive tPA, maintained on aspirin for CVA prophylaxis as well as subcutaneous Lovenox for DVT prophylaxis.  He was tolerating a regular diet.  He did have a sitter at bedside after recent fall. Physical and occupational therapy ongoing.  The patient was admitted for comprehensive rehab program.  PAST MEDICAL HISTORY:  See discharge diagnoses.  SOCIAL HISTORY:  Lives at a group home with assistance as needed.  FUNCTIONAL HISTORY:  Prior to admission, independent with supervision for safety.  Functional status upon admission to Rehab Services was ambulating with fluctuating speed, impulsive, moderate assist for balance.  PHYSICAL EXAMINATION:  VITAL SIGNS:  Blood pressure 122/80, pulse 68, temperature 98.1, respirations 20. GENERAL:  This was an alert male, in no acute distress. HEENT:  Pupils round and reactive to light.  He was able to provide  his name.  He was highly perseverative.  He was able to identify simple objects. LUNGS:  Clear to auscultation. CARDIAC:  Regular rate and rhythm. ABDOMEN:  Soft, nontender.  Good bowel sounds.  REHABILITATION HOSPITAL COURSE:  The patient was admitted to Inpatient Rehab Services with therapies initiated on a 3-hour daily basis consisting of physical therapy, occupational therapy, speech therapy, and rehabilitation nursing.  The following issues were addressed during the patient's rehabilitation stay.  Pertaining to Scott [name]'s left CVA remained stable, maintained on aspirin therapy.  He would follow up with Neurology Services.  He remained on subcutaneous Lovenox for DVT prophylaxis and no bleeding episodes.  He did have a history of paranoid schizophrenia, mental retardation.  He continued on Cogentin, Paxil, as well as Risperdal as advised.  He did have a sitter in his room for the patient's safety.  He appeared to be at his cognitive baseline.  He did have a history of hypothyroidism.  He remained on Synthroid, TSH level is 0.411.  He continued on Zocor for hyperlipidemia.  The patient received weekly collaborative interdisciplinary team conferences to discuss estimated length of stay, family teaching, and any barriers to his discharge.  He was ambulating 500 feet minimal assist on a controlled environment assisted to bathroom for minimal assist for his balance.  He was appropriate and cooperative for staff for his therapies.  He  engaged in activities of daily living with focus on functional mobility, balance, and safety.  His plan was to return back to group home where they could provide the necessary 24-hour supervision for his safety.  DISCHARGE MEDICATIONS:  Included aspirin 325 mg p.o. daily, Cogentin 0.5 mg p.o. b.i.d.,  Neurontin 600 mg p.o. t.i.d., Synthroid 75 mcg p.o. daily, Paxil 20 mg p.o. daily, Risperdal 2 mg at bedtime, 1 mg every morning, Zocor 10 mg p.o.  daily.  DIET:  Regular.  SPECIAL INSTRUCTIONS:  The patient would follow up with Dr. Loletta Parish, the [facility] as directed; Dr. Delia Heady, Neurology Services.  Special instructions, 24 hour supervision for patient safety.     Mariam Dollar, P.A.   ______________________________ Erick Colace, M.D.    DA/MEDQ  D:  08/20/2013  T:  08/20/2013  Job:  [id number]  cc:   Pramod P. Pearlean Brownie, MD

## 2013-08-20 NOTE — Patient Care Conference (Signed)
Inpatient RehabilitationTeam Conference and Plan of Care Update Date: 08/20/2013   Time: 9;50 Am    Patient Name: Scott [name]      Medical Record Number: [id number]  Date of Birth: [birth year] Sex: Male         Room/Bed: 4W16C/4W16C-01 Payor Info: Payor: MEDICARE / Plan: MEDICARE PART A AND B / Product Type: *No Product type* /    Admitting Diagnosis: L CVA  Admit Date/Time:  08/14/2013  4:57 PM Admission Comments: No comment available   Primary Diagnosis:  <principal problem not specified> Principal Problem: <principal problem not specified>  Patient Active Problem List   Diagnosis Date Noted  . Ileus 08/12/2013  . CVA (cerebral vascular accident) 08/11/2013  . CVA (cerebral infarction) 08/11/2013  . Hypothyroidism 08/11/2013  . Paranoid schizophrenia 08/11/2013    Expected Discharge Date: Expected Discharge Date: 08/21/13  Team Members Present: Physician leading conference: Dr. Alger Simons Social Worker Present: Ovidio Kin, LCSW Nurse Present: Henri Medal, RN PT Present: Cameron Sprang, PT OT Present: Clyda Greener, Starling Manns, Jules Schick, OT SLP Present: Gunnar Fusi, SLP PPS Coordinator present : Daiva Nakayama, RN, CRRN     Current Status/Progress Goal Weekly Team Focus  Medical   left motor strip stroke. baseline MR/schizoprhenia  stabilize medically for dc  safety, medical mgt    Bowel/Bladder     cont of b&b   cont b&b     Swallow/Nutrition/ Hydration   regular and thin with full supervision   least restrictive PO  goals met    ADL's   min assist mobility, supervision bathing and dressing  min assist transfers, supervision bathing and dressing  functional tasks, balance, participation   Mobility   Min A overall  Supervision transfers, min A gait  Functional tasks, balance, gait   Communication   suspect at baseline         Safety/Cognition/ Behavioral Observations  suspect at baseline         Pain     na        Skin     Monitor  skin-no issues           *See Care Plan and progress notes for long and short-term goals.  Barriers to Discharge: cognition, safety    Possible Resolutions to Barriers:  full supervision    Discharge Planning/Teaching Needs:  Ready to transfer back to [place] if can provide hand-held assist-several messages left for group home director      Team Discussion:  Pt has met his goals and ready to return to group home. D/C from Speech back to baseline.  Revisions to Treatment Plan:  Speech dc-reached his goals   Continued Need for Acute Rehabilitation Level of Care: The patient requires daily medical management by a physician with specialized training in physical medicine and rehabilitation for the following conditions: Daily direction of a multidisciplinary physical rehabilitation program to ensure safe treatment while eliciting the highest outcome that is of practical value to the patient.: Yes Daily medical management of patient stability for increased activity during participation in an intensive rehabilitation regime.: Yes Daily analysis of laboratory values and/or radiology reports with any subsequent need for medication adjustment of medical intervention for : Neurological problems;Other  [name] 08/21/2013, 8:18 AM

## 2013-08-20 NOTE — Progress Notes (Signed)
Physical Therapy Note  Patient Details  Name: Scott FossaJohn G [name] MRN: [id number] Date of Birth: 10/29/1940 Today's Date: 08/20/2013  Time: 730-812 42 minutes  1:1 No c/o pain.  Treatment session focused on sustained attention, balance and safety with functional tasks.  Pt gait throughout unit with min A for balance, pt with mild ataxia, delayed balance reactions, decreased awareness of LOB.  Attempted to have pt water plants as he states he likes to go outside, pt able to water 3 plants with mod cuing for attention to task.  Pt able to put pillows in pillowcases and make bed in ADL apartment with min A for balance, cues for safety.  Pt then wants to go back to room, pt encouraged to stay up to eat breakfast.  Pt requires mod cuing for safety and to slow down with eating.  Pt then states he is tired and wants to return to bed.  Pt able to path find way to his room without cuing.  Missed 15 minutes due to fatigue.   Time 2: 1100-1125 25 minutes  1:1 No c/o pain.  Treatment focused on gait and safety in outdoor environment per pt request.  Pt requires occasional mod A for balance on uneven surfaces and max cues for safety awareness with curb steps and with crossing street.  Pt min A for gait in controlled environment > 500'.  Pt assisted to bathroom with min A for balance.  Pt then asks to rest in bed and listen to radio.  Margrett Kalb 08/20/2013, 8:13 AM

## 2013-08-20 NOTE — Progress Notes (Signed)
Occupational Therapy Session Note  Patient Details  Name: Marya FossaJohn G Weismann MRN: [id number] Date of Birth: 02/18/1941  Today's Date: 08/20/2013 Time: 4098-11911333-1403 Time Calculation (min): 30 min  Short Term Goals: Week 1:  OT Short Term Goal 1 (Week 1): STG = LTGs due to ELOS  Skilled Therapeutic Interventions/Progress Updates:    Engaged in therapeutic activity with focus on sustained attention, balance, and safety with functional tasks.  Pt ambulated to therapy gym with min assist - close supervision due to posterior lean and fast pace.  Pt assisted with gathering items from mat and returning to shelf with good carryover and ability to navigate obstacles with close supervision.  Mod verbal cues for safety with mobility as pt tends to walk very fast.  Pt with multiple questions regarding when he will return to group home.  Attempted to obtain clothing from laundry, however clothing still damp in dryer.   Therapy Documentation Precautions:  Precautions Precautions: Fall Precaution Comments: Hx of mental retardation and paranoid schizophrenia, impulsive Restrictions Weight Bearing Restrictions: No Pain:  Pt with no c/o pain  See FIM for current functional status  Therapy/Group: Individual Therapy  Rosalio LoudHOXIE, Chriss Redel 08/20/2013, 2:30 PM

## 2013-08-20 NOTE — Progress Notes (Signed)
Occupational Therapy Discharge Summary  Patient Details  Name: Scott [name] MRN: [id number] Date of Birth: 31-Jan-1941  Today's Date: 08/20/2013  Patient has met 8 of 8 long term goals due to improved activity tolerance and ability to compensate for deficits.  Patient to discharge at overall Supervision level.  Patient's care partner is independent to provide the necessary physical and cognitive assistance at discharge, no formal education has been conducted with any individual from group home however group home director reports they can provide for him at a min assist/supervision level as he fluctuates due to cognition and impulsivity.    Reasons goals not met: N/A  Recommendation:  Patient will not require follow up OT at this time.  Equipment: No equipment provided  Reasons for discharge: treatment goals met and discharge from hospital  Patient/family agrees with progress made and goals achieved: Yes  OT Discharge Precautions/Restrictions  Precautions Precautions: Fall Precaution Comments: Hx of mental retardation and paranoid schizophrenia, impulsive Pain Pain Assessment Pain Assessment: No/denies pain ADL  See FIM Vision/Perception  Vision - Assessment Additional Comments: unable to assess due to pt unable to follow directions  Cognition Overall Cognitive Status: History of cognitive impairments - at baseline (baseline unknown) Arousal/Alertness: Awake/alert Orientation Level: Oriented to person;Oriented to place;Disoriented to time;Disoriented to situation Behaviors: Impulsive;Poor frustration tolerance Safety/Judgment: Impaired Sensation Sensation Light Touch: Appears Intact Stereognosis: Not tested Hot/Cold: Not tested Proprioception: Appears Intact Coordination Gross Motor Movements are Fluid and Coordinated: Not tested Fine Motor Movements are Fluid and Coordinated: Not tested Motor  Motor Motor: Abnormal postural alignment and control;Motor  impersistence Motor - Skilled Clinical Observations: Motor impersistence observed during gait with intermittent R foot drag.  Presents with forward head, thoracic kyphosis, R rib flaring and increased posterior pelvic tilt; premorbid Extremity/Trunk Assessment RUE Assessment RUE Assessment: Within Functional Limits LUE Assessment LUE Assessment: Within Functional Limits  See FIM for current functional status  Lalla Laham, [facility] 08/20/2013, 2:37 PM

## 2013-08-20 NOTE — Progress Notes (Signed)
Social Work Patient ID: Scott FossaJohn G [name], male   DOB: 09/07/1940, 73 y.o.   MRN: [id number] Have left several messages and texts for Group Home Director to inform of discharge tomorrow for pt. Informed of discharge time and expect someone To be here to transport him home.

## 2013-08-20 NOTE — Progress Notes (Signed)
Occupational Therapy Session Note  Patient Details  Name: Marya FossaJohn G Pemberton MRN: [id number] Date of Birth: 06/08/1940  Today's Date: 08/20/2013 Time: [id number] Time Calculation (min): 43 min  Short Term Goals: Week 1:  OT Short Term Goal 1 (Week 1): STG = LTGs due to ELOS  Skilled Therapeutic Interventions/Progress Updates:    Pt resting in bed with sitter at side upon arrival.  Pt initially declined bathing stating that he washed yesterday.  Pt did state that he would like to change clothes.  Pt's group home has not provided patient with any clothing but new clothing provided for patient to change into.  After doffing clothing patient agreed to shower if the water was warm.  Pt amb with min A (HHA) to bathroom and stood in shower with steady A for balance.  Pt exhibits posterior lean and posterior pelvic tilt while standing.  Pt required min verbal cues to use soap since patient preferred to just let the water run over him.  Pt amb with HHA to washer to place soiled clothing in washer.  Several attempts to engage patient in various functional tasks were unsuccessful and patient continued to state that he wanted to go back to his bed and listen to his radio. Pt returned to bed with sitter present.  Therapy Documentation Precautions:  Precautions Precautions: Fall Precaution Comments: Hx of mental retardation and paranoid schizophrenia, impulsive Restrictions Weight Bearing Restrictions: No General: General Amount of Missed OT Time (min): 17 Minutes Missed Time Reason: Patient fatigue   Pain: Pain Assessment Pain Assessment: No/denies pain Pain Score: 0-No pain Faces Pain Scale: No hurt  See FIM for current functional status  Therapy/Group: Individual Therapy  Rich BraveLanier, Adael Culbreath Chappell 08/20/2013, 10:14 AM

## 2013-08-21 DIAGNOSIS — I6350 Cerebral infarction due to unspecified occlusion or stenosis of unspecified cerebral artery: Secondary | ICD-10-CM

## 2013-08-21 LAB — CREATININE, SERUM
Creatinine, Ser: 0.66 mg/dL (ref 0.50–1.35)
GFR calc non Af Amer: >90 mL/min (ref >90)

## 2013-08-21 MED ORDER — GABAPENTIN 300 MG PO CAPS: 600.0000 mg | ORAL_CAPSULE | Freq: Three times a day (TID) | ORAL | Status: DC

## 2013-08-21 MED ORDER — PAROXETINE HCL 20 MG PO TABS: 20.0000 mg | ORAL_TABLET | Freq: Every day | ORAL | Status: DC

## 2013-08-21 MED ORDER — BENZTROPINE MESYLATE 0.5 MG PO TABS: 0.5000 mg | ORAL_TABLET | Freq: Two times a day (BID) | ORAL | Status: DC

## 2013-08-21 MED ORDER — LEVOTHYROXINE SODIUM 75 MCG PO TABS: 75.0000 ug | ORAL_TABLET | Freq: Every day | ORAL | Status: AC

## 2013-08-21 MED ORDER — SIMVASTATIN 10 MG PO TABS: 10.0000 mg | ORAL_TABLET | Freq: Every day | ORAL | Status: DC

## 2013-08-21 MED ORDER — ASPIRIN 325 MG PO TBEC: 325.0000 mg | DELAYED_RELEASE_TABLET | Freq: Every day | ORAL | Status: AC

## 2013-08-21 NOTE — Progress Notes (Signed)
Social Work [name], LCSW Social Worker Signed  Patient Care Conference Service date: 08/20/2013 2:28 PM  Inpatient RehabilitationTeam Conference and Plan of Care Update Date: 08/20/2013   Time: 9;50 Am     Patient Name: Scott [name]       Medical Record Number: [id number]   Date of Birth: 09-24-1940 Sex: Male         Room/Bed: 4W16C/4W16C-01 Payor Info: Payor: MEDICARE / Plan: MEDICARE PART A AND B / Product Type: *No Product type* /   Admitting Diagnosis: L CVA   Admit Date/Time:  08/14/2013  4:57 PM Admission Comments: No comment available   Primary Diagnosis:  <principal problem not specified> Principal Problem: <principal problem not specified>    Patient Active Problem List     Diagnosis  Date Noted   .  Ileus  08/12/2013   .  CVA (cerebral vascular accident)  08/11/2013   .  CVA (cerebral infarction)  08/11/2013   .  Hypothyroidism  08/11/2013   .  Paranoid schizophrenia  08/11/2013     Expected Discharge Date: Expected Discharge Date: 08/21/13  Team Members Present: Physician leading conference: Dr. Alger Simons Social Worker Present: Ovidio Kin, LCSW Nurse Present: Henri Medal, RN PT Present: Cameron Sprang, PT OT Present: Clyda Greener, Starling Manns, Jules Schick, OT SLP Present: Gunnar Fusi, SLP PPS Coordinator present : Daiva Nakayama, RN, CRRN        Current Status/Progress  Goal  Weekly Team Focus   Medical     left motor strip stroke. baseline MR/schizoprhenia  stabilize medically for dc  safety, medical mgt    Bowel/Bladder     cont of b&b  cont b&b     Swallow/Nutrition/ Hydration     regular and thin with full supervision   least restrictive PO  goals met    ADL's     min assist mobility, supervision bathing and dressing  min assist transfers, supervision bathing and dressing  functional tasks, balance, participation   Mobility     Min A overall  Supervision transfers, min A gait  Functional tasks, balance, gait    Communication     suspect at baseline       Safety/Cognition/ Behavioral Observations    suspect at baseline       Pain     na       Skin     Monitor skin-no issues         *See Care Plan and progress notes for long and short-term goals.    Barriers to Discharge:  cognition, safety      Possible Resolutions to Barriers:    full supervision      Discharge Planning/Teaching Needs:    Ready to transfer back to [place] if can provide hand-held assist-several messages left for group home director      Team Discussion:    Pt has met his goals and ready to return to group home. D/C from Speech back to baseline.   Revisions to Treatment Plan:    Speech dc-reached his goals    Continued Need for Acute Rehabilitation Level of Care: The patient requires daily medical management by a physician with specialized training in physical medicine and rehabilitation for the following conditions: Daily direction of a multidisciplinary physical rehabilitation program to ensure safe treatment while eliciting the highest outcome that is of practical value to the patient.: Yes Daily medical management of patient stability for increased activity during participation in an intensive rehabilitation  regime.: Yes Daily analysis of laboratory values and/or radiology reports with any subsequent need for medication adjustment of medical intervention for : Neurological problems;Other  [name] 08/21/2013, 8:18 AM          Patient ID: Shelba Flake, male   DOB: [birth year], 73 y.o.   MRN: [id number]

## 2013-08-21 NOTE — Progress Notes (Signed)
Patient discharged to group home with caregiver. Patient and caregiver received discharge instructions from Deatra Inaan Angiulli, PA with verbal understanding.

## 2013-08-21 NOTE — Progress Notes (Signed)
Social Work Discharge Note Discharge Note  The overall goal for the admission was met for:   Discharge location: Yes-BACK TO GROUP HOME-[facility]  Length of Stay: Yes-7 DAYS  Discharge activity level: Yes-SUPERVISION LEVEL  Home/community participation: Yes  Services provided included: MD, RD, PT, OT, SLP, RN, CM, Pharmacy and [place]: Medicare and Medicaid  Follow-up services arranged: Home Health: [facility]-PT,OT and Patient/Family has no preference for HH/DME agencies  Comments (or additional information):PT DID WELL AND READY TO [place].  PATRICIA & SUSAN ARE AWARE HE REQUIRES HAND HELD ASSIST-GROUP HOME WILL  PROVIDE.  OFFERED TO HAVE GROUP HOME STAFF COME IN AND ATTEND THERAPIES WITH PT DECLINED.  Patient/Family verbalized understanding of follow-up arrangements: Yes  Individual responsible for coordination of the follow-up plan: SUSAN-SISTER/GUARDIAN & PATRICIA-GROUP HOME DIRECTOR  Confirmed correct DME delivered: [name] 08/21/2013    [name]

## 2013-08-21 NOTE — Progress Notes (Signed)
Physical Therapy Discharge Summary  Patient Details  Name: Scott [name] MRN: [id number] Date of Birth: [birth year]  Today's Date: 08/21/2013    Patient has met 8 of 9 long term goals due to improved activity tolerance, improved balance, improved postural control and ability to compensate for deficits.  Patient to discharge at an ambulatory level [place].    Reasons goals not met: floor transfer not performed  Recommendation:  Patient will benefit from ongoing skilled PT services in home health setting to continue to advance safe functional mobility, address ongoing impairments in balance, endurance, and minimize fall risk.  Equipment: No equipment provided  Reasons for discharge: treatment goals met and discharge from hospital  Patient/family agrees with progress made and goals achieved: Yes  PT Discharge Precautions/Restrictions Precautions Precautions: Fall Precaution Comments: Hx of mental retardation and paranoid schizophrenia, impulsive Restrictions Weight Bearing Restrictions: No  Cognition Overall Cognitive Status: History of cognitive impairments - at baseline Sensation Sensation Light Touch: Appears Intact Proprioception: Appears Intact Coordination Gross Motor Movements are Fluid and Coordinated: Not tested Motor  Motor Motor: Abnormal postural alignment and control;Motor impersistence Motor - Skilled Clinical Observations: Motor impersistence observed during gait with intermittent R foot drag.  Presents with forward head, thoracic kyphosis, R rib flaring and increased posterior pelvic tilt; premorbid   Trunk/Postural Assessment  Cervical Assessment Cervical Assessment:  (forward head) Thoracic Assessment Thoracic Assessment:  (kyphotic) Lumbar Assessment Lumbar Assessment:  (posterior tilt) Postural Control Postural Control:  (delayed balance reactions)  Balance Static Standing Balance Static Standing - Level of Assistance: 5: Stand by  assistance Dynamic Standing Balance Dynamic Standing - Balance Support: During functional activity Dynamic Standing - Level of Assistance: 4: Min assist Extremity Assessment      RLE Strength RLE Overall Strength Comments: R foot drag during gait LLE Assessment LLE Assessment: Within Functional Limits  See FIM for current functional status  [name] 08/21/2013, 1:15 PM

## 2013-08-21 NOTE — Progress Notes (Signed)
Subjective/Complaints:   "when can I go home?" ROS limited due to cog/ling issues. Sitter still in room.  Objective: Vital Signs: Blood pressure 144/90, pulse 74, temperature 97.3 F (36.3 C), temperature source Oral, resp. rate 19, weight 72.576 kg (160 lb), SpO2 96.00%. No results found. No results found for this or any previous visit (from the past 72 hour(s)).    General: No acute distress  Heart: Regular rate and rhythm no rubs murmurs or extra sounds Lungs: Clear to auscultation, breathing unlabored, no rales or wheezes Abdomen: Positive bowel sounds, soft nontender to palpation, nondistended Extremities: No clubbing, cyanosis, or edema Skin: No evidence of breakdown, no evidence of rash Neurologic: Cranial nerves II through XII intact, motor strength is 4/5 in bilateral deltoid, bicep, tricep, grip, hip flexor, knee extensors, ankle dorsiflexor and plantar flexor Sensory exam unable to assess secondary to MS Cerebellar exam normal finger to nose to finger as well as heel to shin in bilateral upper and lower extremities Musculoskeletal: Full range of motion in all 4 extremities. No joint swelling   Assessment/Plan: 1. 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 Stable for D/C today to group home F/u PCP in 1-2 weeks F/u PM&R 3 weeks See D/C summary See D/C instructions  FIM: FIM - Bathing Bathing Steps Patient Completed: Chest;Right Arm;Left Arm;Abdomen;Right upper leg;Left upper leg;Front perineal area;Buttocks;Right lower leg (including foot);Left lower leg (including foot) Bathing: 5: Supervision: Safety issues/verbal cues  FIM - Upper Body Dressing/Undressing Upper body dressing/undressing steps patient completed: Thread/unthread right sleeve of pullover shirt/dresss;Thread/unthread left sleeve of pullover  shirt/dress;Put head through opening of pull over shirt/dress;Pull shirt over trunk Upper body dressing/undressing: 5: Set-up assist to: Obtain clothing/put away FIM - Lower Body Dressing/Undressing Lower body dressing/undressing steps patient completed: Thread/unthread right underwear leg;Thread/unthread left underwear leg;Pull underwear up/down;Thread/unthread right pants leg;Thread/unthread left pants leg;Pull pants up/down;Don/Doff left sock;Don/Doff right sock Lower body dressing/undressing: 5: Supervision: Safety issues/verbal cues  FIM - Toileting Toileting steps completed by patient: Adjust clothing prior to toileting;Performs perineal hygiene;Adjust clothing after toileting Toileting Assistive Devices: Grab bar or rail for support Toileting: 5: Supervision: Safety issues/verbal cues  FIM - Diplomatic Services operational officerToilet Transfers Toilet Transfers Assistive Devices: Grab bars Toilet Transfers: 4-To toilet/BSC: Min A (steadying Pt. > 75%);4-From toilet/BSC: Min A (steadying Pt. > 75%)  FIM - Bed/Chair Transfer Bed/Chair Transfer: 6: Supine > Sit: No assist;6: Sit > Supine: No assist;4: Bed > Chair or W/C: Min A (steadying Pt. > 75%);4: Chair or W/C > Bed: Min A (steadying Pt. > 75%)  FIM - Locomotion: Wheelchair Locomotion: Wheelchair: 0: Activity did not occur FIM - Locomotion: Ambulation Ambulation/Gait Assistance: 4: Min assist;3: Mod assist Locomotion: Ambulation: 3: Travels 150 ft or more with moderate assistance (Pt: 50 - 74%)  Comprehension Comprehension Mode: Auditory Comprehension: 1-Understands basic less than 25% of the time/requires cueing 75% of the time  Expression Expression Mode: Verbal Expression: 5-Expresses basic needs/ideas: With extra time/assistive device  Social Interaction Social Interaction: 2-Interacts appropriately 25 - 49% of time - Needs frequent redirection.  Problem Solving Problem Solving: 2-Solves basic 25 - 49% of the time - needs direction more than half the time  to initiate, plan or complete simple activities  Memory Memory: 1-Recognizes or recalls less than 25% of the time/requires cueing greater than 75% of the time  Medical Problem List and Plan:  1. 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 DVT Prophylaxis/Anticoagulation: Subcutaneous Lovenox. Monitor platelet counts any signs of bleeding  3. Pain Management: Neurontin 600 mg 3 times a day, Tylenol. Monitor with increased mobility  4. Mood/paranoid schizophrenia. Cogentin 0.5 mg twice a day, Paxil 20 mg daily, Risperdal 1 mg every a.m. 2 mg each bedtime. Will discuss baseline cognition with group home.   -sitter for safety for now--reviewing with team 5. Neuropsych: This patient is not capable of making decisions on his own behalf.  6. Hypothyroidism. Synthroid. TSH level 0.411  7. Hyperlipidemia. Zocor    LOS (Days) 7 A FACE TO FACE EVALUATION WAS PERFORMED  Inette Doubrava E 08/21/2013, 8:02 AM

## 2013-08-27 ENCOUNTER — Telehealth: Payer: Self-pay

## 2013-08-27 NOTE — Telephone Encounter (Signed)
[facility] requesting a written or verbal order for a walker with rollers for patient. He said he faxed the order to be signed.

## 2013-08-28 NOTE — Telephone Encounter (Signed)
Ok for rolling walker 

## 2013-08-28 NOTE — Telephone Encounter (Signed)
Contacted Lee @ [facility] and gave him a verbal order for a rolling walker for patient per Dr. Wynn BankerKirsteins.

## 2013-09-29 ENCOUNTER — Ambulatory Visit (HOSPITAL_BASED_OUTPATIENT_CLINIC_OR_DEPARTMENT_OTHER): Payer: Medicare Other | Admitting: Physical Medicine & Rehabilitation

## 2013-09-29 ENCOUNTER — Encounter: Payer: Medicare Other | Attending: Physical Medicine & Rehabilitation

## 2013-09-29 ENCOUNTER — Encounter: Payer: Self-pay | Admitting: Physical Medicine & Rehabilitation

## 2013-09-29 VITALS — BP 107/66 | HR 71 | Resp 14 | Wt 140.4 lb

## 2013-09-29 DIAGNOSIS — I639 Cerebral infarction, unspecified: Secondary | ICD-10-CM

## 2013-09-29 DIAGNOSIS — R6250 Unspecified lack of expected normal physiological development in childhood: Secondary | ICD-10-CM | POA: Insufficient documentation

## 2013-09-29 DIAGNOSIS — I6350 Cerebral infarction due to unspecified occlusion or stenosis of unspecified cerebral artery: Secondary | ICD-10-CM

## 2013-09-29 DIAGNOSIS — F209 Schizophrenia, unspecified: Secondary | ICD-10-CM | POA: Insufficient documentation

## 2013-09-29 DIAGNOSIS — M216X9 Other acquired deformities of unspecified foot: Secondary | ICD-10-CM | POA: Insufficient documentation

## 2013-09-29 DIAGNOSIS — I69998 Other sequelae following unspecified cerebrovascular disease: Secondary | ICD-10-CM | POA: Insufficient documentation

## 2013-09-29 NOTE — Patient Instructions (Addendum)
Needs appt [facility] Dr Pearlean BrownieSethi [facility] outpatient [facility]

## 2013-09-29 NOTE — Progress Notes (Signed)
Subjective:    Patient ID: Scott FossaJohn G Avena, male    DOB: 01/13/1941, 73 y.o.   MRN: [id number] This is a 73 year old right-handed male,  history of significant paranoid schizophrenia and mental retardation  currently living in a group home. Presented on August 11, 2013, with  right-sided weakness. MRI of the brain showed small areas of cortically  based restricted diffusion suspect medial left motor strip. Carotid  Dopplers with no ICA stenosis. Echocardiogram with ejection fraction of  60% grade 1 diastolic dysfunction  HPI [facility] [place] DATE OF ADMISSION: 08/14/2013  DATE OF DISCHARGE: 08/21/2013  Patient has followed up with Dr.Elkins his primary care physician since discharge from the hospital. Has had Home Health therapy,until  April 22.  Outpatient therapy to start soon. Had a fall after he first came home no injury.  Patient is still not back to his baseline in regards to his balance as well as his ADLs. Requires more assistance getting in and out of the tub than prior to the stroke  Administrator of his group home is with him today. She notes that staff have been complaining of increased incidence of bowel incontinence with loose stools Patient denies any abdominal pain denies any nausea or vomiting Pain Inventory Average Pain 0 Pain Right Now 0 My pain is no pain  In the last 24 hours, has pain interfered with the following? General activity 0 Relation with others 0 Enjoyment of life 0 What TIME of day is your pain at its worst? no pain Sleep (in general) Good  Pain is worse with: no pain Pain improves with: no pain Relief from Meds: no pain  Mobility walk with assistance use a walker ability to climb steps?  yes do you drive?  no  Function disabled: date disabled . I need assistance with the following:  dressing, bathing, toileting, meal prep, household duties and shopping  Neuro/Psych bowel control problems confusion  Prior Studies Any changes  since last visit?  no  Physicians involved in your care Any changes since last visit?  no   History reviewed. No pertinent family history. History   Social History  . Marital Status: Single    Spouse Name: N/A    Number of Children: N/A  . Years of Education: N/A   Social History Main Topics  . Smoking status: Former Games developermoker  . Smokeless tobacco: None  . Alcohol Use: None  . Drug Use: None  . Sexual Activity: None   Other Topics Concern  . None   Social History Narrative  . None   History reviewed. No pertinent past surgical history. Past Medical History  Diagnosis Date  . Paranoid schizophrenia   . Stroke    BP 107/66  Pulse 71  Resp 14  Wt 140 lb 6.4 oz (63.685 kg)  SpO2 95%  Opioid Risk Score:   Fall Risk Score: High Fall Risk (>13 points) (educated and handout given on fall prevention in the home)  Review of Systems  Gastrointestinal: Positive for diarrhea.       Bowel Control Problems  Psychiatric/Behavioral: Positive for confusion.  All other systems reviewed and are negative.      Objective:   Physical Exam Motor strength is 5/5 in the right deltoid, bicep, tricep, grip, right hip flexor, knee extensors, 3-/5 ankle dorsiflexion plantar flexor Motor strength is 5/5 in the left deltoid, bicep, tricep, grip, hip flexor, knee extensors, ankle dorsiflexor  Sensory difficult to evaluate secondary to chronic cognitive deficits Fine motor reduced  in the left hand with finger to thumb opposition once again difficult to assess secondary to chronic cognitive deficits Ambulate short distance without device  Patient is oriented to person as well as doctor's office but not time. He is unaware that he has had a stroke or what stroke is     Assessment & Plan:  1. 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 Overall making improvements. Still has residual balance deficits. I would recommend outpatient PT and OT since he is not back at his  baseline. OT would focus more on ADL centered mobility  2. Diarrhea I do not see any medications that are likely to be the cause. He will followup with his primary M.D. on this

## 2013-10-06 ENCOUNTER — Ambulatory Visit: Payer: Medicare Other | Attending: Family Medicine | Admitting: Occupational Therapy

## 2013-10-06 DIAGNOSIS — R279 Unspecified lack of coordination: Secondary | ICD-10-CM | POA: Diagnosis not present

## 2013-10-06 DIAGNOSIS — M6281 Muscle weakness (generalized): Secondary | ICD-10-CM | POA: Diagnosis not present

## 2013-10-06 DIAGNOSIS — Z8673 Personal history of transient ischemic attack (TIA), and cerebral infarction without residual deficits: Secondary | ICD-10-CM | POA: Insufficient documentation

## 2013-10-06 DIAGNOSIS — IMO0001 Reserved for inherently not codable concepts without codable children: Secondary | ICD-10-CM | POA: Insufficient documentation

## 2013-10-21 ENCOUNTER — Ambulatory Visit: Payer: Medicare Other | Admitting: Rehabilitative and Restorative Service Providers"

## 2013-10-21 ENCOUNTER — Encounter: Payer: Medicare Other | Admitting: Occupational Therapy

## 2013-10-21 DIAGNOSIS — E039 Hypothyroidism, unspecified: Secondary | ICD-10-CM

## 2013-10-21 DIAGNOSIS — Z5189 Encounter for other specified aftercare: Secondary | ICD-10-CM

## 2013-10-21 DIAGNOSIS — R279 Unspecified lack of coordination: Secondary | ICD-10-CM | POA: Diagnosis not present

## 2013-10-21 DIAGNOSIS — Z9181 History of falling: Secondary | ICD-10-CM

## 2013-10-21 DIAGNOSIS — E785 Hyperlipidemia, unspecified: Secondary | ICD-10-CM

## 2013-10-21 DIAGNOSIS — Z8673 Personal history of transient ischemic attack (TIA), and cerebral infarction without residual deficits: Secondary | ICD-10-CM

## 2013-10-21 DIAGNOSIS — F200 Paranoid schizophrenia: Secondary | ICD-10-CM | POA: Diagnosis not present

## 2013-10-21 DIAGNOSIS — F79 Unspecified intellectual disabilities: Secondary | ICD-10-CM

## 2013-10-23 ENCOUNTER — Ambulatory Visit: Payer: Medicare Other | Attending: Family Medicine | Admitting: Physical Therapy

## 2013-10-23 DIAGNOSIS — R279 Unspecified lack of coordination: Secondary | ICD-10-CM | POA: Insufficient documentation

## 2013-10-23 DIAGNOSIS — Z5189 Encounter for other specified aftercare: Secondary | ICD-10-CM | POA: Diagnosis not present

## 2013-10-23 DIAGNOSIS — I69998 Other sequelae following unspecified cerebrovascular disease: Secondary | ICD-10-CM | POA: Diagnosis not present

## 2013-10-23 DIAGNOSIS — M6281 Muscle weakness (generalized): Secondary | ICD-10-CM | POA: Insufficient documentation

## 2013-10-23 DIAGNOSIS — I69919 Unspecified symptoms and signs involving cognitive functions following unspecified cerebrovascular disease: Secondary | ICD-10-CM | POA: Insufficient documentation

## 2013-10-23 DIAGNOSIS — R269 Unspecified abnormalities of gait and mobility: Secondary | ICD-10-CM | POA: Insufficient documentation

## 2013-10-27 ENCOUNTER — Ambulatory Visit: Payer: Medicare Other | Admitting: Physical Therapy

## 2013-10-27 DIAGNOSIS — Z5189 Encounter for other specified aftercare: Secondary | ICD-10-CM | POA: Diagnosis not present

## 2013-10-30 ENCOUNTER — Encounter: Payer: Medicare Other | Admitting: Occupational Therapy

## 2013-11-03 ENCOUNTER — Ambulatory Visit: Payer: Medicare Other | Admitting: Physical Therapy

## 2013-11-07 ENCOUNTER — Ambulatory Visit: Payer: Medicare Other | Admitting: Occupational Therapy

## 2013-11-07 DIAGNOSIS — Z5189 Encounter for other specified aftercare: Secondary | ICD-10-CM | POA: Diagnosis not present

## 2013-11-11 ENCOUNTER — Ambulatory Visit: Payer: Medicare Other | Admitting: Physical Therapy

## 2013-11-11 ENCOUNTER — Ambulatory Visit: Payer: Medicare Other | Admitting: Occupational Therapy

## 2013-11-11 DIAGNOSIS — Z5189 Encounter for other specified aftercare: Secondary | ICD-10-CM | POA: Diagnosis not present

## 2013-11-18 ENCOUNTER — Ambulatory Visit: Payer: Medicare Other | Admitting: Occupational Therapy

## 2013-11-18 ENCOUNTER — Ambulatory Visit: Payer: Medicare Other | Admitting: Physical Therapy

## 2013-11-18 DIAGNOSIS — Z5189 Encounter for other specified aftercare: Secondary | ICD-10-CM | POA: Diagnosis not present

## 2014-01-05 ENCOUNTER — Ambulatory Visit: Payer: Self-pay | Admitting: Nurse Practitioner

## 2015-06-17 ENCOUNTER — Inpatient Hospital Stay (HOSPITAL_COMMUNITY)
Admission: EM | Admit: 2015-06-17 | Discharge: 2015-06-23 | DRG: 064 | Disposition: A | Discharge status: Skilled Nursing Facility | Payer: Medicare Other | Attending: Internal Medicine | Admitting: Internal Medicine

## 2015-06-17 ENCOUNTER — Inpatient Hospital Stay (HOSPITAL_COMMUNITY): Payer: Medicare Other

## 2015-06-17 ENCOUNTER — Other Ambulatory Visit: Payer: Self-pay

## 2015-06-17 ENCOUNTER — Encounter (HOSPITAL_COMMUNITY): Payer: Self-pay | Admitting: *Deleted

## 2015-06-17 ENCOUNTER — Emergency Department (HOSPITAL_COMMUNITY): Payer: Medicare Other

## 2015-06-17 DIAGNOSIS — I6522 Occlusion and stenosis of left carotid artery: Secondary | ICD-10-CM | POA: Diagnosis not present

## 2015-06-17 DIAGNOSIS — F329 Major depressive disorder, single episode, unspecified: Secondary | ICD-10-CM | POA: Diagnosis present

## 2015-06-17 DIAGNOSIS — R414 Neurologic neglect syndrome: Secondary | ICD-10-CM | POA: Diagnosis not present

## 2015-06-17 DIAGNOSIS — E876 Hypokalemia: Secondary | ICD-10-CM | POA: Diagnosis present

## 2015-06-17 DIAGNOSIS — R1319 Other dysphagia: Secondary | ICD-10-CM | POA: Diagnosis present

## 2015-06-17 DIAGNOSIS — F209 Schizophrenia, unspecified: Secondary | ICD-10-CM | POA: Diagnosis not present

## 2015-06-17 DIAGNOSIS — F23 Brief psychotic disorder: Secondary | ICD-10-CM | POA: Diagnosis present

## 2015-06-17 DIAGNOSIS — W07XXXA Fall from chair, initial encounter: Secondary | ICD-10-CM | POA: Diagnosis not present

## 2015-06-17 DIAGNOSIS — I63511 Cerebral infarction due to unspecified occlusion or stenosis of right middle cerebral artery: Secondary | ICD-10-CM | POA: Diagnosis not present

## 2015-06-17 DIAGNOSIS — K598 Other specified functional intestinal disorders: Secondary | ICD-10-CM | POA: Diagnosis present

## 2015-06-17 DIAGNOSIS — R140 Abdominal distension (gaseous): Secondary | ICD-10-CM

## 2015-06-17 DIAGNOSIS — Z7982 Long term (current) use of aspirin: Secondary | ICD-10-CM

## 2015-06-17 DIAGNOSIS — W19XXXA Unspecified fall, initial encounter: Secondary | ICD-10-CM

## 2015-06-17 DIAGNOSIS — J9601 Acute respiratory failure with hypoxia: Secondary | ICD-10-CM | POA: Diagnosis not present

## 2015-06-17 DIAGNOSIS — R4781 Slurred speech: Secondary | ICD-10-CM

## 2015-06-17 DIAGNOSIS — F79 Unspecified intellectual disabilities: Secondary | ICD-10-CM

## 2015-06-17 DIAGNOSIS — J690 Pneumonitis due to inhalation of food and vomit: Secondary | ICD-10-CM | POA: Diagnosis not present

## 2015-06-17 DIAGNOSIS — R471 Dysarthria and anarthria: Secondary | ICD-10-CM | POA: Diagnosis not present

## 2015-06-17 DIAGNOSIS — R4182 Altered mental status, unspecified: Secondary | ICD-10-CM | POA: Diagnosis not present

## 2015-06-17 DIAGNOSIS — F200 Paranoid schizophrenia: Secondary | ICD-10-CM | POA: Diagnosis present

## 2015-06-17 DIAGNOSIS — I63411 Cerebral infarction due to embolism of right middle cerebral artery: Principal | ICD-10-CM | POA: Diagnosis present

## 2015-06-17 DIAGNOSIS — Z8673 Personal history of transient ischemic attack (TIA), and cerebral infarction without residual deficits: Secondary | ICD-10-CM | POA: Diagnosis not present

## 2015-06-17 DIAGNOSIS — M6289 Other specified disorders of muscle: Secondary | ICD-10-CM

## 2015-06-17 DIAGNOSIS — E039 Hypothyroidism, unspecified: Secondary | ICD-10-CM | POA: Diagnosis present

## 2015-06-17 DIAGNOSIS — R0602 Shortness of breath: Secondary | ICD-10-CM | POA: Diagnosis not present

## 2015-06-17 DIAGNOSIS — F70 Mild intellectual disabilities: Secondary | ICD-10-CM | POA: Diagnosis not present

## 2015-06-17 DIAGNOSIS — I639 Cerebral infarction, unspecified: Secondary | ICD-10-CM | POA: Diagnosis present

## 2015-06-17 DIAGNOSIS — Z66 Do not resuscitate: Secondary | ICD-10-CM | POA: Diagnosis not present

## 2015-06-17 DIAGNOSIS — S0181XA Laceration without foreign body of other part of head, initial encounter: Secondary | ICD-10-CM | POA: Diagnosis present

## 2015-06-17 DIAGNOSIS — Z79899 Other long term (current) drug therapy: Secondary | ICD-10-CM | POA: Diagnosis not present

## 2015-06-17 DIAGNOSIS — I6529 Occlusion and stenosis of unspecified carotid artery: Secondary | ICD-10-CM | POA: Insufficient documentation

## 2015-06-17 DIAGNOSIS — Z515 Encounter for palliative care: Secondary | ICD-10-CM | POA: Diagnosis not present

## 2015-06-17 DIAGNOSIS — I6789 Other cerebrovascular disease: Secondary | ICD-10-CM | POA: Diagnosis not present

## 2015-06-17 DIAGNOSIS — F32A Depression, unspecified: Secondary | ICD-10-CM | POA: Diagnosis present

## 2015-06-17 HISTORY — DX: Cerebral infarction, unspecified: I63.9

## 2015-06-17 HISTORY — DX: Unspecified fall, initial encounter: W19.XXXA

## 2015-06-17 HISTORY — DX: Major depressive disorder, single episode, unspecified: F32.9

## 2015-06-17 HISTORY — DX: Unspecified intellectual disabilities: F79

## 2015-06-17 HISTORY — DX: Depression, unspecified: F32.A

## 2015-06-17 HISTORY — DX: Brief psychotic disorder: F23

## 2015-06-17 HISTORY — DX: Hypothyroidism, unspecified: E03.9

## 2015-06-17 LAB — DIFFERENTIAL
BASOS PCT: 0 %
Basophils Absolute: 0 10*3/uL (ref 0.0–0.1)
EOS ABS: 0.1 10*3/uL (ref 0.0–0.7)
EOS PCT: 1 %
Lymphocytes Relative: 13 %
Lymphs Abs: 1.2 10*3/uL (ref 0.7–4.0)
MONO ABS: 0.9 10*3/uL (ref 0.1–1.0)
MONOS PCT: 10 %
Neutro Abs: 7.1 10*3/uL (ref 1.7–7.7)
Neutrophils Relative %: 76 %

## 2015-06-17 LAB — I-STAT CHEM 8, ED
BUN: 18 mg/dL (ref 6–20)
CALCIUM ION: 1.12 mmol/L — AB (ref 1.13–1.30)
Chloride: 105 mmol/L (ref 101–111)
Creatinine, Ser: 0.6 mg/dL — ABNORMAL LOW (ref 0.61–1.24)
GLUCOSE: 95 mg/dL (ref 65–99)
HCT: 38 % — ABNORMAL LOW (ref 39.0–52.0)
HEMOGLOBIN: 12.9 g/dL — AB (ref 13.0–17.0)
Potassium: 3.7 mmol/L (ref 3.5–5.1)
SODIUM: 142 mmol/L (ref 135–145)
TCO2: 25 mmol/L (ref 0–100)

## 2015-06-17 LAB — CBC
HEMATOCRIT: 36.5 % — AB (ref 39.0–52.0)
HEMOGLOBIN: 12 g/dL — AB (ref 13.0–17.0)
MCH: 30.2 pg (ref 26.0–34.0)
MCHC: 32.9 g/dL (ref 30.0–36.0)
MCV: 91.9 fL (ref 78.0–100.0)
Platelets: 309 10*3/uL (ref 150–400)
RBC: 3.97 MIL/uL — AB (ref 4.22–5.81)
RDW: 12.7 % (ref 11.5–15.5)
WBC: 9.3 10*3/uL (ref 4.0–10.5)

## 2015-06-17 LAB — URINALYSIS, ROUTINE W REFLEX MICROSCOPIC
BILIRUBIN URINE: NEGATIVE
Glucose, UA: NEGATIVE mg/dL
Hgb urine dipstick: NEGATIVE
Ketones, ur: NEGATIVE mg/dL
LEUKOCYTES UA: NEGATIVE
NITRITE: NEGATIVE
Protein, ur: NEGATIVE mg/dL
SPECIFIC GRAVITY, URINE: 1.012 (ref 1.005–1.030)
pH: 7 (ref 5.0–8.0)

## 2015-06-17 LAB — CDS SEROLOGY

## 2015-06-17 LAB — PREPARE FRESH FROZEN PLASMA
UNIT DIVISION: 0
Unit division: 0

## 2015-06-17 LAB — COMPREHENSIVE METABOLIC PANEL
ALBUMIN: 3.3 g/dL — AB (ref 3.5–5.0)
ALK PHOS: 217 U/L — AB (ref 38–126)
ALT: 12 U/L — ABNORMAL LOW (ref 17–63)
ANION GAP: 11 (ref 5–15)
AST: 20 U/L (ref 15–41)
BUN: 15 mg/dL (ref 6–20)
CALCIUM: 8.6 mg/dL — AB (ref 8.9–10.3)
CO2: 25 mmol/L (ref 22–32)
Chloride: 106 mmol/L (ref 101–111)
Creatinine, Ser: 0.76 mg/dL (ref 0.61–1.24)
GFR calc non Af Amer: >60 mL/min (ref >60)
Glucose, Bld: 101 mg/dL — ABNORMAL HIGH (ref 65–99)
POTASSIUM: 3.9 mmol/L (ref 3.5–5.1)
SODIUM: 142 mmol/L (ref 135–145)
TOTAL PROTEIN: 6 g/dL — AB (ref 6.5–8.1)
Total Bilirubin: 0.8 mg/dL (ref 0.3–1.2)

## 2015-06-17 LAB — PROTIME-INR
INR: 1.26 (ref 0.00–1.49)
PROTHROMBIN TIME: 16 s — AB (ref 11.6–15.2)

## 2015-06-17 LAB — RAPID URINE DRUG SCREEN, HOSP PERFORMED
Amphetamines: NOT DETECTED
Barbiturates: NOT DETECTED
Benzodiazepines: NOT DETECTED
Cocaine: NOT DETECTED
Opiates: NOT DETECTED
Tetrahydrocannabinol: NOT DETECTED

## 2015-06-17 LAB — I-STAT TROPONIN, ED: TROPONIN I, POC: 0 ng/mL (ref 0.00–0.08)

## 2015-06-17 LAB — ABO/RH: ABO/RH(D): B POS

## 2015-06-17 LAB — I-STAT CG4 LACTIC ACID, ED: LACTIC ACID, VENOUS: 1.43 mmol/L (ref 0.5–2.0)

## 2015-06-17 LAB — CBG MONITORING, ED: GLUCOSE-CAPILLARY: 105 mg/dL — AB (ref 65–99)

## 2015-06-17 LAB — APTT: APTT: 41 s — AB (ref 24–37)

## 2015-06-17 LAB — ETHANOL

## 2015-06-17 MED ORDER — SENNOSIDES-DOCUSATE SODIUM 8.6-50 MG PO TABS: 1.0000 | ORAL_TABLET | Freq: Every evening | ORAL | Status: DC | PRN

## 2015-06-17 MED ORDER — STROKE: EARLY STAGES OF RECOVERY BOOK
Freq: Once | Status: DC
  Filled 2015-06-17 (×2): qty 1

## 2015-06-17 MED ORDER — LIDOCAINE-EPINEPHRINE 1 %-1:100000 IJ SOLN
20.0000 mL | Freq: Once | INTRAMUSCULAR | Status: AC
  Administered 2015-06-17: 20 mL via INTRADERMAL
  Filled 2015-06-17: qty 1

## 2015-06-17 MED ORDER — TRAMADOL HCL 50 MG PO TABS
50.0000 mg | ORAL_TABLET | Freq: Four times a day (QID) | ORAL | Status: DC | PRN
  Filled 2015-06-17: qty 1

## 2015-06-17 MED ORDER — ASPIRIN 325 MG PO TABS
325.0000 mg | ORAL_TABLET | Freq: Every day | ORAL | Status: DC
  Administered 2015-06-20 – 2015-06-23 (×4): 325 mg via ORAL
  Filled 2015-06-17 (×4): qty 1

## 2015-06-17 MED ORDER — BENZTROPINE MESYLATE 0.5 MG PO TABS
0.5000 mg | ORAL_TABLET | Freq: Two times a day (BID) | ORAL | Status: DC
  Administered 2015-06-18 (×2): 0.5 mg via ORAL
  Filled 2015-06-17 (×2): qty 1

## 2015-06-17 MED ORDER — RISPERIDONE 0.5 MG PO TABS
1.0000 mg | ORAL_TABLET | Freq: Every morning | ORAL | Status: DC
  Administered 2015-06-18: 1 mg via ORAL
  Filled 2015-06-17: qty 2

## 2015-06-17 MED ORDER — RISPERIDONE 0.5 MG PO TABS
2.0000 mg | ORAL_TABLET | Freq: Every evening | ORAL | Status: DC
  Administered 2015-06-18: 2 mg via ORAL
  Filled 2015-06-17: qty 4

## 2015-06-17 MED ORDER — GABAPENTIN 300 MG PO CAPS
600.0000 mg | ORAL_CAPSULE | Freq: Three times a day (TID) | ORAL | Status: DC
  Administered 2015-06-18 (×3): 600 mg via ORAL
  Filled 2015-06-17 (×3): qty 2

## 2015-06-17 MED ORDER — SODIUM CHLORIDE 0.9 % IV SOLN
INTRAVENOUS | Status: DC
  Administered 2015-06-18 (×2): via INTRAVENOUS

## 2015-06-17 MED ORDER — LEVOTHYROXINE SODIUM 75 MCG PO TABS
75.0000 ug | ORAL_TABLET | Freq: Every day | ORAL | Status: DC
  Filled 2015-06-17: qty 1

## 2015-06-17 MED ORDER — ASPIRIN 300 MG RE SUPP
300.0000 mg | Freq: Every day | RECTAL | Status: DC
  Administered 2015-06-18 – 2015-06-19 (×2): 300 mg via RECTAL
  Filled 2015-06-17 (×2): qty 1

## 2015-06-17 MED ORDER — OMEGA-3-ACID ETHYL ESTERS 1 G PO CAPS
1.0000 g | ORAL_CAPSULE | Freq: Every day | ORAL | Status: DC
  Administered 2015-06-20 – 2015-06-23 (×3): 1 g via ORAL
  Filled 2015-06-17 (×3): qty 1

## 2015-06-17 MED ORDER — PAROXETINE HCL 20 MG PO TABS
20.0000 mg | ORAL_TABLET | Freq: Every day | ORAL | Status: DC
  Administered 2015-06-18: 20 mg via ORAL
  Filled 2015-06-17: qty 1

## 2015-06-17 NOTE — H&P (Signed)
[facility] History and Physical  NATION CRADLE ZOX:[id number] DOB: 02/03/1941 DOA: 06/17/2015  Referring physician: ED physician PCP: No primary care provider on file.  Specialists:   Chief Complaint: fall, AMS and slurred speech  HPI: Scott [name] is a 75 y.o. male with PMH of hypothyroidism, depression, mental retardation, schizophrenia, who presents with fall, altered mental status and slurred speech.  Patient has AMS and mental retardation, is unable to provide medical history, therefore, most of the history is obtained by discussing the case with ED physician, per EMS report, and with the nursing staff. It seems that pt was initially brought in due to a witnessed fall in dining room at SNF where he suffered a large laceration over right forehead. He was fount to have slurred speech, neglect and weakness to left side. He dose not seem to have CP, cough and diarrhea. His abdomen is distended. He move all extremities.  In ED, patient was found to have negative urinalysis, lactate 1.43, INR 1.26, WBC 9.3, temperature normal, no tachycardia, electrolytes and renal function okay. Chest x-ray has no infiltration, but showed possible ileus. CT head is negative for acute intracranial abnormalities, but showed contusion and laceration over the right side of the frontal bone, chronic microvascular ischemic change and remote left frontal infarct. Patient is admitted to inpatient for further reevaluation and treatment. Neurology was consulted.  EKG: Independently reviewed. QTC 449, early R-wave progression  Where does patient live?   SNF     Can patient participate in ADLs?   None  Review of Systems: Could not be reviewed due to altered mental status and mental retardation.  Allergy: No Known Allergies  Past Medical History  Diagnosis Date  . Hypothyroid   . Mental retardation   . Schizophrenia, acute (HCC)   . Depression   . Stroke (cerebrum) [facility])     History reviewed. No pertinent  past surgical history. Could not be reviewed due to altered mental status and mental retardation.  Social History:  has no tobacco, alcohol, and drug history on file. Could not be reviewed due to altered mental status and mental retardation.  Family History: History reviewed. No pertinent family history. Could not be reviewed due to altered mental status and mental retardation.   Prior to Admission medications   Medication Sig Start Date End Date Taking? Authorizing Provider  aspirin 325 MG tablet Take 325 mg by mouth daily.   Yes Historical Provider, MD  benztropine (COGENTIN) 0.5 MG tablet Take 0.5 mg by mouth 2 (two) times daily.   Yes Historical Provider, MD  gabapentin (NEURONTIN) 300 MG capsule Take 600 mg by mouth 3 (three) times daily.   Yes Historical Provider, MD  levothyroxine (SYNTHROID, LEVOTHROID) 75 MCG tablet Take 75 mcg by mouth daily before breakfast.   Yes Historical Provider, MD  Omega-3 Fatty Acids (FISH OIL) 1000 MG CAPS Take 1 capsule by mouth 2 (two) times daily.   Yes Historical Provider, MD  PARoxetine (PAXIL) 20 MG tablet Take 20 mg by mouth daily.   Yes Historical Provider, MD  risperiDONE (RISPERDAL) 1 MG tablet Take 1 mg by mouth every morning.   Yes Historical Provider, MD  risperiDONE (RISPERDAL) 2 MG tablet Take 2 mg by mouth every evening.   Yes Historical Provider, MD  traMADol (ULTRAM) 50 MG tablet Take 50 mg by mouth every 6 (six) hours as needed for moderate pain.   Yes Historical Provider, MD    Physical Exam: Filed Vitals:   06/17/15 2200  06/17/15 2215 06/17/15 2230 06/17/15 2300  BP: 123/75 133/89 125/81 121/79  Pulse:      Temp:      TempSrc:      Resp: Height:      Weight:      SpO2: 93% 92% 97% 94%   General: Not in acute distress HEENT: has skin laceration over right forehead, which was sutured.       Eyes: PERRL, EOMI, no scleral icterus.       ENT: No discharge from the ears and nose, no pharynx injection, no tonsillar  enlargement.        Neck: No JVD, no bruit, no mass felt. Heme: No neck lymph node enlargement. Cardiac: S1/S2, RRR, No murmurs, No gallops or rubs. Pulm:  No rales, wheezing, rhonchi or rubs. Abd: Distended, possible tenderness over central abdomen, no rebound pain, no organomegaly, BS present. Ext: No pitting leg edema bilaterally. 2+DP/PT pulse bilaterally. Musculoskeletal: No joint deformities, No joint redness or warmth, no limitation of ROM in spin. Skin: No rashes.  Neuro: Alert, not oriented X3, cranial nerves II-XII grossly intact except for left neglect. He moves all extremities, but not cooperated with  physical examination, difficult to assess muscle strength. Psych: Patient is not psychotic, no suicidal or hemocidal ideation.  Labs on Admission:  Basic Metabolic Panel:  Recent Labs Lab 06/17/15 1932 06/17/15 1944  NA 142 142  K 3.9 3.7  CL 106 105  CO2 25  --   GLUCOSE 101* 95  BUN 15 18  CREATININE 0.76 0.60*  CALCIUM 8.6*  --    Liver Function Tests:  Recent Labs Lab 06/17/15 1932  AST 20  ALT 12*  ALKPHOS 217*  BILITOT 0.8  PROT 6.0*  ALBUMIN 3.3*   No results for input(s): LIPASE, AMYLASE in the last 168 hours. No results for input(s): AMMONIA in the last 168 hours. CBC:  Recent Labs Lab 06/17/15 1932 06/17/15 1944  WBC 9.3  --   NEUTROABS 7.1  --   HGB 12.0* 12.9*  HCT 36.5* 38.0*  MCV 91.9  --   PLT 309  --    Cardiac Enzymes: No results for input(s): CKTOTAL, CKMB, CKMBINDEX, TROPONINI in the last 168 hours.  BNP (last 3 results) No results for input(s): BNP in the last 8760 hours.  ProBNP (last 3 results) No results for input(s): PROBNP in the last 8760 hours.  CBG:  Recent Labs Lab 06/17/15 1951  GLUCAP 105*    Radiological Exams on Admission: Ct Head Wo Contrast  06/17/2015  CLINICAL DATA:  Status post fall today.  Initial encounter. EXAM: CT HEAD WITHOUT CONTRAST TECHNIQUE: Contiguous axial images were obtained from the  base of the skull through the vertex without intravenous contrast. COMPARISON:  None. FINDINGS: Contusion and laceration over right frontal bone are identified. There is no underlying fracture. The brain is atrophic with chronic microvascular ischemic change. Remote appearing left frontal infarct is noted. No acute intracranial abnormality including hemorrhage, infarct, mass lesion, mass effect, midline shift or abnormal extra-axial fluid collection. IMPRESSION: Contusion and laceration over the right side of the frontal bone. Negative for fracture or acute intracranial abnormality. Atrophy, chronic microvascular ischemic change and remote left frontal infarct. These results were called by telephone at the time of interpretation on 06/17/2015 at 8:30 pm to Dr. Cathren Laine , who verbally acknowledged these results. Electronically Signed   By: Drusilla Kanner M.D.   On: 06/17/2015 20:30   Dg Chest  Portable 1 View  06/17/2015  CLINICAL DATA:  Head trauma secondary to a fall today. Code stroke. Initial encounter. EXAM: PORTABLE CHEST 1 VIEW COMPARISON:  None. FINDINGS: The lungs are clear. Heart size is upper normal. No pneumothorax or pleural effusion. No focal bony abnormality. Gaseous distention of visualized bowel loops in the upper abdomen noted. IMPRESSION: No acute cardiopulmonary disease. Gaseous distention of visualized bowel loops is nonspecific but may be be due to ileus. Electronically Signed   By: Drusilla Kanner M.D.   On: 06/17/2015 20:10    Assessment/Plan Principal Problem:   Stroke (cerebrum) (HCC) Active Problems:   Hypothyroid   Mental retardation   Schizophrenia, acute (HCC)   Depression   Fall   Stroke [facility])   Stroke (cerebrum) [facility]): pt has slurred speech, confusion, left-sided weakness and left sided neglect. Per Dr. Hosie Poisson, patient may have stroke. He recommended stroke workup. His fall was likely due to stroke.  -will admit to tele bed -Appreciate Dr. Minus Breeding  recommendations, with follow-up recommendations as follows    1. HgbA1c, fasting lipid panel  2. MRI, MRA of the brain without contrast (I have not ordered MRI/MRA yet, since I am not sure whether he has any metals in his body. I tried to call POA, his sister, Ms. Lamb [phone] without success)  3. PT consult, OT consult, Speech consult  4. Echocardiogram  5. Carotid dopplers  6. Prophylactic therapy-ASA  daily  7. Risk factor modification  8. Telemetry monitoring  9. Frequent neuro checks  10. NPO until RN stroke swallow screen -f/u Ct-neck to r/o neck bony abnormalities  Hypothyroidism: Last TSH was not on record -Continue home Synthroid -Check TSH  Schizophrenia, acute (HCC):  -continue respiratory and Cogentin  Depression: -Paxil  Abdominal distention: Patient has possible tenderness on physical examination. Unclear etiology. -CT-abdomen/pelvis  Skin laceration over right forehead: sutured. -Tramadol when necessary for pain -Wound care consult   DVT ppx: SCD  Code Status: Full code Family Communication: None at bed side.    Disposition Plan: Admit to inpatient   Date of Service 06/17/2015    Lorretta Harp [facility] Pager [phone]  If 7PM-7AM, please contact night-coverage [email or web address] Password [facility] 06/17/2015, 11:37 PM

## 2015-06-17 NOTE — ED Provider Notes (Signed)
CSN: [id number]     Arrival date & time 06/17/15  1926 History   First MD Initiated Contact with Patient 06/17/15 1955     Chief Complaint  Patient presents with  . Fall     (Consider location/radiation/quality/duration/timing/severity/associated sxs/prior Treatment) Patient is a 75 y.o. male presenting with fall. The history is provided by the EMS personnel. The history is limited by the condition of the patient.  Fall This is a new problem. The current episode started today. The problem occurs intermittently. The problem has been gradually improving. Associated symptoms comments: Reported GCS 6 on the field with a large head laceration. Nothing aggravates the symptoms. He has tried nothing for the symptoms. The treatment provided no relief.    Past Medical History  Diagnosis Date  . Hypothyroid   . Mental retardation   . Schizophrenia, acute (HCC)   . Depression   . Stroke (cerebrum) [facility])    History reviewed. No pertinent past surgical history. History reviewed. No pertinent family history. Social History  Substance Use Topics  . Smoking status: Unknown If Ever Smoked  . Smokeless tobacco: None  . Alcohol Use: None    Review of Systems  Unable to perform ROS: Acuity of condition      Allergies  Review of patient's allergies indicates no known allergies.  Home Medications   Prior to Admission medications   Medication Sig Start Date End Date Taking? Authorizing Provider  aspirin 325 MG tablet Take 325 mg by mouth daily.   Yes Historical Provider, MD  benztropine (COGENTIN) 0.5 MG tablet Take 0.5 mg by mouth 2 (two) times daily.   Yes Historical Provider, MD  gabapentin (NEURONTIN) 300 MG capsule Take 600 mg by mouth 3 (three) times daily.   Yes Historical Provider, MD  levothyroxine (SYNTHROID, LEVOTHROID) 75 MCG tablet Take 75 mcg by mouth daily before breakfast.   Yes Historical Provider, MD  Omega-3 Fatty Acids (FISH OIL) 1000 MG CAPS Take 1 capsule by mouth 2  (two) times daily.   Yes Historical Provider, MD  PARoxetine (PAXIL) 20 MG tablet Take 20 mg by mouth daily.   Yes Historical Provider, MD  risperiDONE (RISPERDAL) 1 MG tablet Take 1 mg by mouth every morning.   Yes Historical Provider, MD  risperiDONE (RISPERDAL) 2 MG tablet Take 2 mg by mouth every evening.   Yes Historical Provider, MD  traMADol (ULTRAM) 50 MG tablet Take 50 mg by mouth every 6 (six) hours as needed for moderate pain.   Yes Historical Provider, MD   BP 159/93 mmHg  Pulse 82  Temp(Src) 99.5 F (37.5 C) (Oral)  Resp 18  Ht  (1.778 m)  Wt 77.111 kg  BMI 24.39 kg/m2  SpO2 92% Physical Exam  Constitutional: Vital signs are normal. He appears ill. Face mask in place.  HENT:  Head: Normocephalic.  Mouth/Throat: No oropharyngeal exudate.  Large 5cm jagged avulsion laceration over an old sutured wound to right forehead. Minimal oozing  Eyes: Conjunctivae are normal. Pupils are equal, round, and reactive to light.  Right sided gaze preference  Neck:  c-collar in place  Cardiovascular: Normal rate, regular rhythm, normal heart sounds and intact distal pulses.   Pulmonary/Chest: Effort normal and breath sounds normal. No respiratory distress. He exhibits no tenderness.  Abdominal: Soft. He exhibits no distension. There is no tenderness. There is no rebound.  Musculoskeletal: He exhibits no edema or tenderness.  Neurological: He is alert. He is disoriented (patient has MR but can answer basic  questions and follow basic commands). He displays no tremor. A sensory deficit (appears to be neglecting left sided) is present. GCS eye subscore is 4. GCS verbal subscore is 4. GCS motor subscore is 6.  Skin: Skin is warm and dry. No rash noted. No erythema.  Nursing note and vitals reviewed.   ED Course  .Marland KitchenLaceration Repair Date/Time: 06/17/2015 9:20 PM Performed by: Marijean Niemann Authorized by: Gwyneth Sprout Consent: The procedure was performed in an emergent situation.  Verbal consent obtained. Written consent not obtained. Patient identity confirmed: provided demographic data Body area: head/neck Location details: left eyelid Wound length (cm): jagged, 3 pronged laceration rougly 5cm total. Foreign bodies: no foreign bodies Vascular damage: no Anesthesia: local infiltration Local anesthetic: lidocaine 1% with epinephrine Anesthetic total: 6 ml Patient sedated: no Preparation: Patient was prepped and draped in the usual sterile fashion. Irrigation solution: saline Irrigation method: syringe Amount of cleaning: extensive Debridement: minimal Degree of undermining: extensive Skin closure: Ethilon Subcutaneous closure: 4-0 Chromic gut Number of sutures: 9 4-0 ethilon in skin. 3 4-0 gut chromic through deep tissues. Technique: complex Approximation: close Approximation difficulty: complex Dressing: antibiotic ointment Patient tolerance: Patient tolerated the procedure well with no immediate complications Comments: Patient with significant deep tissue involvement, but does not appear to go to bone or involve nerve or vessels.   (including critical care time) Labs Review Labs Reviewed  COMPREHENSIVE METABOLIC PANEL - Abnormal; Notable for the following:    Glucose, Bld 101 (*)    Calcium 8.6 (*)    Total Protein 6.0 (*)    Albumin 3.3 (*)    ALT 12 (*)    Alkaline Phosphatase 217 (*)    All other components within normal limits  CBC - Abnormal; Notable for the following:    RBC 3.97 (*)    Hemoglobin 12.0 (*)    HCT 36.5 (*)    All other components within normal limits  PROTIME-INR - Abnormal; Notable for the following:    Prothrombin Time 16.0 (*)    All other components within normal limits  APTT - Abnormal; Notable for the following:    aPTT 41 (*)    All other components within normal limits  LIPID PANEL - Abnormal; Notable for the following:    HDL 40 (*)    All other components within normal limits  I-STAT CHEM 8, ED - Abnormal;  Notable for the following:    Creatinine, Ser 0.60 (*)    Calcium, Ion 1.12 (*)    Hemoglobin 12.9 (*)    HCT 38.0 (*)    All other components within normal limits  CBG MONITORING, ED - Abnormal; Notable for the following:    Glucose-Capillary 105 (*)    All other components within normal limits  CDS SEROLOGY  ETHANOL  DIFFERENTIAL  URINE RAPID DRUG SCREEN, HOSP PERFORMED  URINALYSIS, ROUTINE W REFLEX MICROSCOPIC (NOT AT [facility])  TSH  GLUCOSE, CAPILLARY  HEMOGLOBIN A1C  I-STAT TROPOININ, ED  I-STAT CG4 LACTIC ACID, ED  I-STAT CHEM 8, ED  I-STAT TROPOININ, ED  TYPE AND SCREEN  PREPARE FRESH FROZEN PLASMA  ABO/RH  BLOOD PRODUCT ORDER (VERBAL) VERIFICATION    Imaging Review Ct Head Wo Contrast  06/17/2015  CLINICAL DATA:  Status post fall today.  Initial encounter. EXAM: CT HEAD WITHOUT CONTRAST TECHNIQUE: Contiguous axial images were obtained from the base of the skull through the vertex without intravenous contrast. COMPARISON:  None. FINDINGS: Contusion and laceration over right frontal bone are identified. There is  no underlying fracture. The brain is atrophic with chronic microvascular ischemic change. Remote appearing left frontal infarct is noted. No acute intracranial abnormality including hemorrhage, infarct, mass lesion, mass effect, midline shift or abnormal extra-axial fluid collection. IMPRESSION: Contusion and laceration over the right side of the frontal bone. Negative for fracture or acute intracranial abnormality. Atrophy, chronic microvascular ischemic change and remote left frontal infarct. These results were called by telephone at the time of interpretation on 06/17/2015 at 8:30 pm to Dr. Cathren Laine , who verbally acknowledged these results. Electronically Signed   By: Drusilla Kanner M.D.   On: 06/17/2015 20:30  Dg Chest Portable 1 View  06/17/2015  CLINICAL DATA:  Head trauma secondary to a fall today. Code stroke. Initial encounter. EXAM: PORTABLE CHEST 1 VIEW  COMPARISON:  None. FINDINGS: The lungs are clear. Heart size is upper normal. No pneumothorax or pleural effusion. No focal bony abnormality. Gaseous distention of visualized bowel loops in the upper abdomen noted. IMPRESSION: No acute cardiopulmonary disease. Gaseous distention of visualized bowel loops is nonspecific but may be be due to ileus. Electronically Signed   By: Drusilla Kanner M.D.   On: 06/17/2015 20:10   I have personally reviewed and evaluated these images and lab results as part of my medical decision-making.   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  3 4-0 gut chromic deep   PT is a 75 y.o. male hx of prior CVA, mental retardation, schizophrenia presenting from ALF as a level 1 trauma 2/2 decreased GCS in the setting of a fall out of his wheelchair striking his head. Upon arrival patient is awake, alert, and can follow basic commands. ABCs intact. Appears to have a right sided gaze preference, left sided weakness with neglect. Has a complex large laceration to right forehead but otherwise without traumatic injury. Patient made a code stroke soon after arrival in conjunction with the trauma team. Last reported normal around 7pm. Neurology consulted. Head ct obtained with acute intracranial process. Patient will not be given tPA per neuro 2/2 patients baseline conditions and families request. Forehead lac repaired as above.  Patient will be admitted to medicine for further management and evaluation    Marijean Niemann, MD 06/18/15 1110  Gwyneth Sprout, MD 06/18/15 1534

## 2015-06-17 NOTE — ED Notes (Signed)
Pt in via EMS from [facility] in [place] after a witnessed fall from a chair, pt struck his head on concrete, laceration to right forehead and previous bruising to right eye noted also, pt with right sided gaze, neglecting left, activated as level 1 trauma PTA, after assessment on arrival trauma is being downgraded to nonactivation and code stroke is being activated with LSN of 1900

## 2015-06-17 NOTE — ED Notes (Signed)
Neuro consulted when code stroke activated

## 2015-06-17 NOTE — Consult Note (Signed)
Stroke Consult Consulting Physician: Dr Anitra Lauth ED  Chief Complaint: confusion and left-sided weakness  HPI: Scott [name] is an 75 y.o. male hx of prior CVA, mental retardation, schizophrenia presenting from ALF/group home with confusion and left-sided weakness. LSW 1850, while in the dinning room noted to slump over, fell out of chair hitting head. Suffered large facial laceration. Upon arrival to ED noted to have slurred speech, left-sided weakness and neglect of left side. Code stroke activated.   Called patients sister, Ms Scott [name], who states patient had prior stroke around 2 years ago, states is was mild. She reports tPA was discussed at that time and she did not wish to use it then due to risks. She reports at baseline, his mental statis is like, "talking to a toddler".   CT head imaging reviewed, shows no acute process  Date last known well: 06/17/2015 Time last known well: 1850 tPA Given: no, family did not wish to pursue tPA Modified Rankin: Rankin Score=3  Past Medical History  Diagnosis Date  . Hypothyroid   . Mental retardation   . Schizophrenia, acute (HCC)     History reviewed. No pertinent past surgical history.  History reviewed. No pertinent family history. Social History:  has no tobacco, alcohol, and drug history on file.  Allergies: No Known Allergies   (Not in a hospital admission)  ROS: Out of a complete 14 system review, the patient complains of only the following symptoms, and all other reviewed systems are negative. +weakness, confusion  Physical Examination: Filed Vitals:   06/17/15 1929  BP: 120/80  Pulse: 88  Temp: 98 F (36.7 C)  Resp: 20   Physical Exam  Constitutional: He appears well-developed and well-nourished.  Psych: Affect appropriate to situation Eyes: No scleral injection HENT: No OP obstrucion Head: Normocephalic.  Cardiovascular: Normal rate and regular rhythm.  Respiratory: Effort normal and breath sounds normal.   GI: Soft. Bowel sounds are normal. No distension. There is no tenderness.  Skin: large forehead laceration   Neurologic Examination: Mental Status: Eyes open, oriented to name only, no purposeful verbal output, unable to name items. Moderate dysarthria. Intermittently will follow simple commands. Appears to neglect left side Cranial Nerves: II: optic discs not visualized, absent blink to threat on the left, pupils equal and reactive to light  III,IV, VI: ptosis not present, impaired left lateral gaze V,VII: smile symmetric, unable to assess facial sensation due to mental status VIII: hearing normal bilaterally IX,X: gag reflex present XI: trapezius strength/neck flexion strength normal bilaterally XII: tongue strength normal  Motor: Able to move RUE and RLE fully against gravity and resistance LUE 3/5 proximal, 2/5 distal LLE 3/5 thoughout Tone and bulk:normal tone throughout; no atrophy noted Sensory: withdrawals briskly to noxious stimuli on the right, weak withdrawal LUE, grimaces but no withdrawal on LLE Deep Tendon Reflexes: 1+ and symmetric throughout Plantars: Right: downgoing   Left: downgoing Cerebellar: Unable to test Gait: deferred  Laboratory Studies:   Basic Metabolic Panel:  Recent Labs Lab 06/17/15 1944  NA 142  K 3.7  CL 105  GLUCOSE 95  BUN 18  CREATININE 0.60*    Liver Function Tests: No results for input(s): AST, ALT, ALKPHOS, BILITOT, PROT, ALBUMIN in the last 168 hours. No results for input(s): LIPASE, AMYLASE in the last 168 hours. No results for input(s): AMMONIA in the last 168 hours.  CBC:  Recent Labs Lab 06/17/15 1932 06/17/15 1944  WBC 9.3  --   NEUTROABS 7.1  --  HGB 12.0* 12.9*  HCT 36.5* 38.0*  MCV 91.9  --   PLT 309  --     Cardiac Enzymes: No results for input(s): CKTOTAL, CKMB, CKMBINDEX, TROPONINI in the last 168 hours.  BNP: Invalid input(s): POCBNP  CBG:  Recent Labs Lab 06/17/15 1951  GLUCAP 105*     Microbiology: No results found for this or any previous visit.  Coagulation Studies: No results for input(s): LABPROT, INR in the last 72 hours.  Urinalysis: No results for input(s): COLORURINE, LABSPEC, PHURINE, GLUCOSEU, HGBUR, BILIRUBINUR, KETONESUR, PROTEINUR, UROBILINOGEN, NITRITE, LEUKOCYTESUR in the last 168 hours.  Invalid input(s): APPERANCEUR  Lipid Panel:  No results found for: CHOL, TRIG, HDL, CHOLHDL, VLDL, LDLCALC  HgbA1C: No results found for: HGBA1C  Urine Drug Screen:  No results found for: LABOPIA, COCAINSCRNUR, LABBENZ, AMPHETMU, THCU, LABBARB  Alcohol Level: No results for input(s): ETH in the last 168 hours.  Other results:  Imaging: No results found.  Assessment: 75 y.o. male hx of prior stroke, mental retardation, schizophrenia presenting with sudden onset of slurred speech, confusion, left-sided weakness and left sided neglect. Code stroke activated. Concern for possible R MCA distribution infarct based on clinical presentation.  Discussed clinical findings and concerns of stroke with patients sister, Ms Scott [name]. Discussed that based on last seen well he would be a candidate for tPA. She reports tPA was discussed during his prior stroke and she does not wish to pursue it at this time due to the potential risks, including ICH and/or death. Counseled her that his current symptoms appear to be worse compared to prior stroke presentation. Explained my worry is that he may have suffered a larger stroke and may not make as complete a recovery this time compared to prior stroke. She expressed understanding and ultimately decided to hold on tPA theory. Not ideal IR candidate based on baseline functional status.    Plan: 1. HgbA1c, fasting lipid panel 2. MRI, MRA  of the brain without contrast 3. PT consult, OT consult, Speech consult 4. Echocardiogram 5. Carotid dopplers 6. Prophylactic therapy-ASA  daily 7. Risk factor modification 8. Telemetry  monitoring 9. Frequent neuro checks 10. NPO until RN stroke swallow screen     Elspeth Cho, DO [facility] [phone]  If 7pm- 7am, please page neurology on call as listed in [facility]ION. 06/17/2015, 8:04 PM

## 2015-06-17 NOTE — Progress Notes (Signed)
Code Stroke called on 75 y.o male initially brought in as level 1 trauma following a witnessed fall in dining room at assisted living where he suffered a large facial laceration. Upon arrival to ED noted to have slurred speech, neglect and weakness to left side. LSN 1850. Pertinent history includes prior CVA (2 years ago), mental retardation, and schizophrenia. After discussion with family per Neurologist Dr. Hosie Poisson Patient's baseline mental status is like "talking to a toddler. " Labs drawn and Pt taken to CT scan STAT, negative for acute bleed per Neurologist. CBG 105. NIHSS completed upon arrival to ED room, yielding score of 17. Please see neuro flow sheet for specifics. TPA discussed with Pt sister over the phone, declined due to risks. Pt for stroke work up admit.

## 2015-06-17 NOTE — Progress Notes (Signed)
   06/17/15 1939  Clinical Encounter Type  Visited With Patient not available;Health care provider  Visit Type Initial;Code;Trauma  Referral From Physician   Chaplain responded to a level 1 Trauma turned stroke in the ED. Patient is not available, and per EMS the assisted living facility indicated they would call family and update them. Family is not present at this time, and our support is available as needed.   Alda Ponder, Chaplain 06/17/2015 7:40 PM

## 2015-06-18 ENCOUNTER — Ambulatory Visit (HOSPITAL_COMMUNITY): Payer: Medicare Other

## 2015-06-18 ENCOUNTER — Inpatient Hospital Stay (HOSPITAL_COMMUNITY): Payer: Medicare Other

## 2015-06-18 DIAGNOSIS — I63511 Cerebral infarction due to unspecified occlusion or stenosis of right middle cerebral artery: Secondary | ICD-10-CM

## 2015-06-18 DIAGNOSIS — F79 Unspecified intellectual disabilities: Secondary | ICD-10-CM

## 2015-06-18 DIAGNOSIS — I63411 Cerebral infarction due to embolism of right middle cerebral artery: Secondary | ICD-10-CM | POA: Diagnosis not present

## 2015-06-18 DIAGNOSIS — F209 Schizophrenia, unspecified: Secondary | ICD-10-CM

## 2015-06-18 DIAGNOSIS — I6789 Other cerebrovascular disease: Secondary | ICD-10-CM

## 2015-06-18 DIAGNOSIS — E039 Hypothyroidism, unspecified: Secondary | ICD-10-CM

## 2015-06-18 LAB — TYPE AND SCREEN
ABO/RH(D): B POS
Antibody Screen: NEGATIVE
UNIT DIVISION: 0
UNIT DIVISION: 0

## 2015-06-18 LAB — TSH: TSH: 1.062 u[IU]/mL (ref 0.350–4.500)

## 2015-06-18 LAB — LIPID PANEL
CHOLESTEROL: 113 mg/dL (ref 0–200)
HDL: 40 mg/dL — AB (ref >40)
LDL CALC: 66 mg/dL (ref 0–99)
TRIGLYCERIDES: 33 mg/dL (ref <150)
Total CHOL/HDL Ratio: 2.8 RATIO
VLDL: 7 mg/dL (ref 0–40)

## 2015-06-18 LAB — GLUCOSE, CAPILLARY: Glucose-Capillary: 95 mg/dL (ref 65–99)

## 2015-06-18 LAB — BLOOD PRODUCT ORDER (VERBAL) VERIFICATION

## 2015-06-18 MED ORDER — LORAZEPAM 2 MG/ML IJ SOLN: 1.0000 mg | Freq: Once | INTRAMUSCULAR | Status: DC

## 2015-06-18 MED ORDER — WHITE PETROLATUM GEL
Status: DC | PRN
  Filled 2015-06-18: qty 1

## 2015-06-18 MED ORDER — IOHEXOL 300 MG/ML  SOLN
75.0000 mL | Freq: Once | INTRAMUSCULAR | Status: AC | PRN
  Administered 2015-06-18: 75 mL via INTRAVENOUS

## 2015-06-18 MED ORDER — GADOBENATE DIMEGLUMINE 529 MG/ML IV SOLN
15.0000 mL | Freq: Once | INTRAVENOUS | Status: AC | PRN
  Administered 2015-06-18: 15 mL via INTRAVENOUS

## 2015-06-18 MED ORDER — ENOXAPARIN SODIUM 40 MG/0.4ML ~~LOC~~ SOLN
40.0000 mg | SUBCUTANEOUS | Status: DC
  Administered 2015-06-19: 40 mg via SUBCUTANEOUS
  Filled 2015-06-18: qty 0.4

## 2015-06-18 MED ORDER — TETANUS-DIPHTH-ACELL PERTUSSIS 5-2.5-18.5 LF-MCG/0.5 IM SUSP
0.5000 mL | Freq: Once | INTRAMUSCULAR | Status: AC
  Administered 2015-06-18: 0.5 mL via INTRAMUSCULAR
  Filled 2015-06-18: qty 0.5

## 2015-06-18 NOTE — Evaluation (Signed)
Clinical/Bedside Swallow Evaluation Patient Details  Name: Scott [name] MRN: [id number] Date of Birth: 10-26-1940  Today's Date: 06/18/2015 Time: SLP Start Time (ACUTE ONLY): 1145 SLP Stop Time (ACUTE ONLY): 1215 SLP Time Calculation (min) (ACUTE ONLY): 30 min  Past Medical History:  Past Medical History  Diagnosis Date  . Hypothyroid   . Mental retardation   . Schizophrenia, acute (HCC)   . Depression   . Stroke (cerebrum) [facility])    Past Surgical History: History reviewed. No pertinent past surgical history. HPI:  75 y.o. male with PMH of hypothyroidism, depression, intellectual disability, schizophrenia, who presents with fall, altered mental status and slurred speech. CCT demonstrates contusion/laceration over right side of frontal bone; atrophy/chronic microvascular ischemic change and remote left frontal infarct.   Assessment / Plan / Recommendation Clinical Impression  Pt presents with a likely acute reversible dysphagia.  Oral care required before assessment and copious amounts of dried secretions/blood were removed.  After oral care, pt was marginally more alert, talking intermittently - stating "hey" and "I fell."  Trial POs of water elicited multiple sub-swallows and explosive coughing; subsequent suctioning continued to remove bloody secretions.  Trials of puree were tolerated with  no overt coughing on 7/10 trials. Given pt's intermittent MS, concerns for persisting pharyngeal secretions, and overt s/s of aspiration with water, recommending continuing NPO status for now.  Pt may have meds crushed in puree; please provide vigilant oral care; allow limited ice chips after oral care.  SLP will f/u next date to determine improvements and readiness for diet vs. instrumental study.  D/W RN.     Aspiration Risk  Moderate aspiration risk    Diet Recommendation   NPO except meds crushed in puree and ice chips after oral care.  Medication Administration: Crushed with puree    Other   Recommendations Oral Care Recommendations: Oral care QID;Oral care prior to ice chip/H20   Follow up Recommendations  Skilled Nursing facility    Frequency and Duration min 3x week  2 weeks       Prognosis        Swallow Study   General Date of Onset: 06/17/15 HPI: 75 y.o. male with PMH of hypothyroidism, depression, intellectual disability, schizophrenia, who presents with fall, altered mental status and slurred speech. CCT demonstrates contusion/laceration over right side of frontal bone; atrophy/chronic microvascular ischemic change and remote left frontal infarct. Type of Study: Bedside Swallow Evaluation Previous Swallow Assessment: none per records Diet Prior to this Study: NPO Temperature Spikes Noted: No Respiratory Status: Nasal cannula History of Recent Intubation: No Behavior/Cognition: Lethargic/Drowsy Oral Cavity Assessment: Dried secretions;Erythema (dried blood) Oral Care Completed by SLP: Yes Oral Cavity - Dentition: Edentulous Vision:  (pt maintained eyes closed) Self-Feeding Abilities: Needs assist Patient Positioning: Upright in bed Baseline Vocal Quality: Hoarse;Wet Volitional Cough: Cognitively unable to elicit Volitional Swallow: Unable to elicit    Oral/Motor/Sensory Function Overall Oral Motor/Sensory Function: Within functional limits   Ice Chips Ice chips: Impaired Presentation: Spoon Pharyngeal Phase Impairments: Multiple swallows   Thin Liquid Thin Liquid: Impaired Presentation: Spoon Pharyngeal  Phase Impairments: Suspected delayed Swallow;Multiple swallows;Wet Vocal Quality;Cough - Immediate    Nectar Thick Nectar Thick Liquid: Not tested   Honey Thick Honey Thick Liquid: Not tested   Puree Puree: Impaired Presentation: Spoon Pharyngeal Phase Impairments: Suspected delayed Swallow;Multiple swallows   Solid   GO   Solid: Not tested        Scott [name] Scott [name] 06/18/2015,12:36 PM

## 2015-06-18 NOTE — Progress Notes (Signed)
PT Cancellation Note  Patient Details Name: Scott [name] MRN: [id number] DOB: 24-Nov-1940   Cancelled Treatment:    Reason Eval/Treat Not Completed: Patient not medically ready.  Patient currently with active bedrest orders.  **MD please discontinue bedrest orders when appropriate for patient.  PT will initiate evaluation at that time.  Thank you.   Vena Austria 06/18/2015, 1:07 PM Durenda Hurt. Renaldo Fiddler, [facility] Pager [phone]

## 2015-06-18 NOTE — Care Management Note (Signed)
Case Management Note  Patient Details  Name: Scott [name] MRN: [id number] Date of Birth: [birth year]  Subjective/Objective:                    Action/Plan: Patient presented to ED with altered mental status, slurred speech, fall.  CVA work-up underway. Patient resides at [facility] group home. His sister, Ms. Randa Lynn [phone] is his POA.  CM will follow for discharge needs pending PT/OT evals and physician orders. Expected Discharge Date:                  Expected Discharge Plan:     In-House Referral:     Discharge planning Services     Post Acute Care Choice:    Choice offered to:     DME Arranged:    DME Agency:     HH Arranged:    HH Agency:     Status of Service:  In process, will continue to follow  Medicare Important Message Given:    Date Medicare IM Given:    Medicare IM give by:    Date Additional Medicare IM Given:    Additional Medicare Important Message give by:     If discussed at Long Length of Stay Meetings, dates discussed:    Additional Comments:  Anda Kraft, RN 06/18/2015, 11:17 AM [phone]

## 2015-06-18 NOTE — Progress Notes (Signed)
Addendum to consultation note from earlier today:  Digital rectal exam shows perhaps mild anal stenosis, but I was able to insert essentially my entire index finger, so I do not feel this is clinically significant. There was no fecal impaction or mass effect.   The prostate bed felt diffusely firm and smooth.  Florencia Reasons, M.D. Pager [phone] If no answer or after 5 PM call [phone]

## 2015-06-18 NOTE — Progress Notes (Signed)
Initial Nutrition Assessment  INTERVENTION:   Monitor diet advancement and po adequacy  NUTRITION DIAGNOSIS:   Predicted suboptimal nutrient intake related to dysphagia, inability to eat as evidenced by NPO status.  GOAL:   Patient will meet greater than or equal to 90% of their needs  MONITOR:   Diet advancement, Labs, Weight trends  REASON FOR ASSESSMENT:   Low Braden   ASSESSMENT:   75 y.o. male with PMH of hypothyroidism, depression, intellectual disability, schizophrenia, who presents with fall, altered mental status and slurred speech. CCT demonstrates contusion/laceration over right side of frontal bone; atrophy/chronic microvascular ischemic change and remote left frontal infarct.   Pt was asleep with covers pulled over their head at the time of attempted visit.  RD attempted to speak with patient; however, pt did not wake up.  Legs were visible as they were out from under the blanket.  Observation of patients legs revealed that he had some mild muscle wasting in the thigh area.    Upon chart review, it was noted that the SLP documented acute-reversible dysphagia.  Trial po's resulted in multiple sub-swallows and explosive coughing while trial of puree foods were more tolerated.  Excessive pharyngeal secretions were noted.  Labs and medications reviewed.  Diet Order:  Diet NPO time specified Except for: Ice Chips  Skin:  Reviewed, no issues  Last BM:  PTA  Height:   Ht Readings from Last 1 Encounters:  06/17/15  (1.778 m)   Weight:   Wt Readings from Last 1 Encounters:  06/17/15 170 lb (77.111 kg)   Ideal Body Weight:  75.5 kg  BMI:  Body mass index is 24.39 kg/(m^2).  Estimated Nutritional Needs:   Kcal:  1900-2100  Protein:  95-105 grams  Fluid:  1.9 - 2.1 L  EDUCATION NEEDS:   No education needs identified at this time  Doroteo Glassman, Dietetic Intern Pager: [phone]

## 2015-06-18 NOTE — Progress Notes (Signed)
STROKE TEAM PROGRESS NOTE   HISTORY OF PRESENT ILLNESS Scott [name] is an 75 y.o. male hx of prior CVA, mental retardation, schizophrenia presenting from ALF/group home with confusion and left-sided weakness. LKW 1850 06/17/2015, while in the dinning room noted to slump over, fell out of chair hitting head. Suffered large facial laceration. Upon arrival to ED noted to have slurred speech, left-sided weakness and neglect of left side. Code stroke activated.   Called patients sister, Scott [name], who states patient had prior stroke around 2 years ago, states is was mild. She reports tPA was discussed at that time and she did not wish to use it then due to risks. She reports at baseline, his mental statis is like, "talking to a toddler". CT head imaging reviewed, shows no acute process. Modified Rankin: Rankin Score=3. Patient was not administered TPA secondary to as family did not wish to pursue tPA. He was admitted for further evaluation and treatment.   SUBJECTIVE (INTERVAL HISTORY) His RN is at the bedside.  Family present. He will not speak with Dr. Pearlean Brownie that asked the nurse to come back each time she tries to leave the room.   OBJECTIVE Temp:  [97.9 F (36.6 C)-99.5 F (37.5 C)] 99.5 F (37.5 C) (01/27 1000) Pulse Rate:  [64-88] 82 (01/27 0800) Cardiac Rhythm:  [-] Normal sinus rhythm (01/27 0018) Resp:  [15-21] 18 (01/27 1000) BP: (120-164)/(75-97) 159/93 mmHg (01/27 1000) SpO2:  [92 %-99 %] 92 % (01/27 1000) Weight:  [77.111 kg (170 lb)] 77.111 kg (170 lb) (01/26 1935)  CBC:  Recent Labs Lab 06/17/15 1932 06/17/15 1944  WBC 9.3  --   NEUTROABS 7.1  --   HGB 12.0* 12.9*  HCT 36.5* 38.0*  MCV 91.9  --   PLT 309  --     Basic Metabolic Panel:  Recent Labs Lab 06/17/15 1932 06/17/15 1944  NA 142 142  K 3.9 3.7  CL 106 105  CO2 25  --   GLUCOSE 101* 95  BUN 15 18  CREATININE 0.76 0.60*  CALCIUM 8.6*  --     Lipid Panel:    Component Value Date/Time   CHOL 113  06/18/2015 0223   TRIG 33 06/18/2015 0223   HDL 40* 06/18/2015 0223   CHOLHDL 2.8 06/18/2015 0223   VLDL 7 06/18/2015 0223   LDLCALC 66 06/18/2015 0223   HgbA1c: No results found for: HGBA1C Urine Drug Screen:    Component Value Date/Time   LABOPIA NONE DETECTED 06/17/2015 2050   COCAINSCRNUR NONE DETECTED 06/17/2015 2050   LABBENZ NONE DETECTED 06/17/2015 2050   AMPHETMU NONE DETECTED 06/17/2015 2050   THCU NONE DETECTED 06/17/2015 2050   LABBARB NONE DETECTED 06/17/2015 2050      IMAGING  Ct Head Wo Contrast 06/17/2015   Contusion and laceration over the right side of the frontal bone. Negative for fracture or acute intracranial abnormality. Atrophy, chronic microvascular ischemic change and remote left frontal infarct.   Ct Cervical Spine Wo Contrast 06/18/2015   No apparent fracture or spondylolisthesis. Multifocal osteoarthritic change. Mild to moderate spinal stenosis at C3-4 and C4-5 due to bony hypertrophy. Bilateral carotid artery calcification.   Ct Abdomen Pelvis W Contrast 06/18/2015   Moderate to severe distention of the sigmoid colon and rectum with the sigmoid colon measuring up to 15 cm in greatest transverse dimension. Mild to moderate distention of the remainder of the colon containing stool and gas. No identifiable obstructing process. No evidence of pneumoperitoneum or  small bowel distention. Acute to subacute appearing fracture of the posterior right seventh rib. Tiny right pleural effusion. Fractures involving the sacrum bilaterally, medial pubic bones bilaterally, left inferior pubic ramus and right L5 transverse process - age indeterminate but appears subacute. Correlate with pain in history. Cholelithiasis without CT evidence of acute cholecystitis.   Dg Chest Portable 1 View 06/17/2015   No acute cardiopulmonary disease. Gaseous distention of visualized bowel loops is nonspecific but may be be due to ileus.    PHYSICAL EXAM elderly Caucasian male not in  distress. He has abrasion and contusion over the right frontal region on the scalp. . Afebrile. Head is nontraumatic. Neck is supple without bruit.    Cardiac exam no murmur or gallop. Lungs are clear to auscultation. Distal pulses are well felt. Neurological Exam :  Awake alert patient is nonverbal and mute and has baseline mental retardation.. Barely follows given midline commands. Extraocular movements are full range without nystagmus. Blinks to threat bilaterally. Fundi were not visualized. Vision acuity and fields cannot be reliably tested. Face is symmetric without weakness. Tongue is midline. Motor system exam most right upper and lower extremity well against gravity. Able to move the left side also against gravity but slightly less than the right side. Lower extremity strength is more symmetric. Left proximal upper extremity appears slightly weaker than the right. Patient not cooperative for detailed muscle strength testing. Sensation appears to be preserved bilaterally. Deep tendon reflexes are 2+ symmetric. Plantars are downgoing. Gait was not tested. ASSESSMENT/PLAN Scott [name] is a 75 y.o. male with history of CVA, mental retardation, schizophrenia  presenting with confusion and left-sided weakness following fall where he hit his head and obtained a large laceration. He did not receive IV t-PA as family refused.   Confusion and left sided weakness following a fall with head laceration POSSIBLE STROKE, THOUGH NO RESULTANT SYMPTOMS  MRI  Pending. If unable to tolerate check CT  MRA  Not ordered  Carotid Doppler  pending   2D Echo  pending   LDL 66 on fish oil, no statin indicated  HgbA1c pending  SCDs for VTE prophylaxis  Diet NPO time specified  aspirin 325 mg daily prior to admission, now on aspirin 300 mg suppository daily  Therapy recommendations:  Pending. Okay to be out of bed with therapy. Orders adjusted.  Cancel stroke workup if MRI/repeat CT  negative  Disposition:  Anticipate return to facility as prior to admission  Hypertension  Stable  Other Stroke Risk Factors  Advanced age  Hx stroke/TIA 2 years ago per family where they refused TPA  Other Active Problems  Hypothyroid  Mental retardation  Schizophrenia    Hospital day # 1  BIBY,SHARON  [facility] See Amion for Pager information 06/18/2015 1:59 PM  I have personally examined this patient, reviewed notes, independently viewed imaging studies, participated in medical decision making and plan of care. I have made any additions or clarifications directly to the above note. Agree with note above. He presented with a fall and mild left-sided weakness possibly from) contusion versus small stroke. He remains at risk for neurological worsening and recurrent stroke/TIA, seizures and needs ongoing close neurological monitoring and aggressive risk factor modification. Check MRI scan of the brain and if not possible consider repeat CT scan in 24 hours.  Delia Heady, MD Medical Director [facility] Pager: [phone] 06/18/2015 4:22 PM    To contact Stroke Continuity provider, please refer to [email or web address]. After  hours, contact General Neurology

## 2015-06-18 NOTE — Progress Notes (Addendum)
*  PRELIMINARY RESULTS* Vascular Ultrasound Carotid Duplex (Doppler) has been completed.   Findings suggest 1-39% right internal carotid artery stenosis and >80% left internal carotid artery stenosis. Vertebral arteries are patent with antegrade flow.  Preliminary results discussed with Dr. Jerral Ralph.  06/18/2015 5:06 PM Gertie Fey, RVT, RDCS, RDMS

## 2015-06-18 NOTE — Progress Notes (Signed)
  Echocardiogram 2D Echocardiogram has been performed.  Scott [name] 06/18/2015, 4:12 PM

## 2015-06-18 NOTE — Evaluation (Signed)
Speech Language Pathology Evaluation Patient Details Name: Scott [name] MRN: [id number] DOB: 09-11-1940 Today's Date: 06/18/2015 Time: 2130-8657 SLP Time Calculation (min) (ACUTE ONLY): 15 min  Problem List:  Patient Active Problem List   Diagnosis Date Noted  . Fall 06/17/2015  . Stroke (cerebrum) (HCC) 06/17/2015  . Stroke (HCC) 06/17/2015  . Hypothyroid   . Mental retardation   . Schizophrenia, acute (HCC)   . Depression   . Forehead laceration   . Slurred speech    Past Medical History:  Past Medical History  Diagnosis Date  . Hypothyroid   . Mental retardation   . Schizophrenia, acute (HCC)   . Depression   . Stroke (cerebrum) [facility])    Past Surgical History: History reviewed. No pertinent past surgical history. HPI:  75 y.o. male with PMH of hypothyroidism, depression, intellectual disability, schizophrenia, who presents with fall, altered mental status and slurred speech. CCT demonstrates contusion/laceration over right side of frontal bone; atrophy/chronic microvascular ischemic change and remote left frontal infarct.   Assessment / Plan / Recommendation Clinical Impression  Pt with hx of intellectual disability; currently oriented to person and states that he fell.  Repeatedly asking/attempting to get out of bed despite encouragement to remain in bed.  No family present (sister is POA) to determine baseline level of function.  Pt with mild deficits in intelligibility; follows commands; makes basic needs known.  Will follow-up with family to determine level of function PTA, although suspect he is approaching baseline.     SLP Assessment  Patient needs continued Speech Lanaguage Pathology Services    Follow Up Recommendations  Skilled Nursing facility    Frequency and Duration min 1 x/week  1 week      SLP Evaluation Prior Functioning  Cognitive/Linguistic Baseline: Baseline deficits Baseline deficit details: intellectual disability Type of Home: Skilled  Nursing Facility   Cognition  Overall Cognitive Status: Difficult to assess Arousal/Alertness: Awake/alert Orientation Level: Oriented to person (knew DOB) Attention: Focused Focused Attention: Appears intact Memory: Impaired Memory Impairment: Storage deficit;Retrieval deficit Awareness: Impaired Awareness Impairment: Emergent impairment (can state that he fell, repeatedly attempts to get OOB) Problem Solving: Impaired Problem Solving Impairment: Verbal basic Behaviors: Restless Safety/Judgment: Impaired    Comprehension  Auditory Comprehension Overall Auditory Comprehension: Impaired Yes/No Questions: Within Functional Limits Commands: Within Functional Limits (basic commands) Conversation: Simple    Expression Expression Primary Mode of Expression: Verbal Verbal Expression Overall Verbal Expression: Appears within functional limits for tasks assessed Written Expression Written Expression: Not tested   Oral / Motor  Oral Motor/Sensory Function Overall Oral Motor/Sensory Function: Mild impairment Lingual Symmetry: Abnormal symmetry left;Suspected CN XII (hypoglossal) dysfunction Motor Speech Overall Motor Speech: Impaired at baseline   GO                    [name] [name] 06/18/2015, 1:42 PM

## 2015-06-18 NOTE — Progress Notes (Addendum)
I spoke with Pt's Sister Mrs.Lamb. She is not aware of surg , she said he may have had surg when he was a small child but she wasn't aware of anything. I have viewed images on file of pt's C/A/P and ct head al images appear neg for any  Large implants also -Eustaquio Boyden RT,R,MR

## 2015-06-18 NOTE — Consult Note (Signed)
WOC wound consult note Reason for Consult: right forehead laceration Wound type: laceration sustained when fell from wheelchair in facility and hit his head Pressure Ulcer POA: No Measurement: laceration aprox. 4cm, sutured closed Wound bed: no wound bed, sutured closed Drainage (amount, consistency, odor) bloody Periwound: intact, bruising  Dressing procedure/placement/frequency: Cleanse site, apply vaseline along suture line daily. No other topical care recommended.   Discussed POC with  bedside nurse.  Re consult if needed, will not follow at this time. Thanks  Lekita Kerekes [employer], CWOCN [phone])

## 2015-06-18 NOTE — Progress Notes (Signed)
Arrived from Ed. Alert and oriented to self. Denies any pain.  Laceration with stitches intact and bleeding. Cleansed blood from face. C collar on and aligned. Will continue to monitor.

## 2015-06-18 NOTE — Progress Notes (Signed)
PATIENT DETAILS Name: Scott [name] Age: 75 y.o. Sex: male Date of Birth: June 21, 1940 Admit Date: 06/17/2015 Admitting Physician Lorretta Harp, MD PCP:No primary care provider on file.  Subjective: Awake, alert-bruising on the right forehead, right eye area. Says he can see out of the right eye. Cervical collar was in place this morning.  Assessment/Plan: Principal Problem: Altered mental status with slurred speech: Etiology uncertain-awaiting MRI brain to rule out CVA. However seems to have improved and is more  awake, answers some of my questions appropriately. Speech seems slow but mostly clear. with Dr. Pearlean Brownie, if MRI negative-no need to pursue stroke workup.  Active Problems: Abdominal distention:? Etiology-CT scan of the abdomen shows moderate to severe distention of the sigmoid/rectum-will place a rectal tube for decompression. [facility] gastroenterology consulted. Await further recommendations.  Hypothyroidism: Continue Synthroid, TSH within normal limits   History of mental retardation/schizophrenia:  Continue Risperdal, Paxil, and benztropine.  Fractures involving the sacrum bilaterally, medial pubic bones bilaterally, left inferior pubic ramus and right L5 transverse process: Age indeterminate but most likely subacute-per radiology report. Supportive care. PT evaluation. Do not think he is a candidate for aggressive care at this point-patient's sister-Mrs. Lamb-agrees.  Laceration right forehead: Sutured in the emergency room,tDap x 1.  Disposition: Remain inpatient  Antimicrobial agents  See below  Anti-infectives    None      DVT Prophylaxis: Prophylactic Lovenox   Code Status: Full code  Family Communication Sister Ms Randa Lynn over the phone  Procedures: None  CONSULTS:  GI and neurology  Time spent 30 minutes-Greater than 50% of this time was spent in counseling, explanation of diagnosis, planning of further management, and coordination of  care.  MEDICATIONS: Scheduled Meds: .  stroke: mapping our early stages of recovery book   Does not apply Once  . aspirin  300 mg Rectal Daily   Or  . aspirin  325 mg Oral Daily  . benztropine  0.5 mg Oral BID  . gabapentin  600 mg Oral TID  . levothyroxine  75 mcg Oral QAC breakfast  . omega-3 acid ethyl esters  1 g Oral Daily  . PARoxetine  20 mg Oral Daily  . risperiDONE  1 mg Oral q morning - 10a  . risperiDONE  2 mg Oral QPM   Continuous Infusions: . sodium chloride 75 mL/hr at 06/18/15 0022   PRN Meds:.senna-docusate, traMADol, white petrolatum    PHYSICAL EXAM: Vital signs in last 24 hours: Filed Vitals:   06/18/15 0600 06/18/15 0800 06/18/15 1000 06/18/15 1200  BP: 134/87 134/81 159/93 158/105  Pulse: 74 82  99  Temp: 97.9 F (36.6 C) 98.4 F (36.9 C) 99.5 F (37.5 C) 98.2 F (36.8 C)  TempSrc: Axillary Oral Oral Oral  Resp: Height:      Weight:      SpO2: 94% 94% 92% 92%    Weight change:  Filed Weights   06/17/15 1935  Weight: 77.111 kg (170 lb)   Body mass index is 24.39 kg/(m^2).   Gen Exam: Awake and alert with clear speech.  Neck: Supple, No JVD.  Chest: B/L Clear.   CVS: S1 S2 Regula Abdomen: soft, BS +, non tender, distended-mostly in the lower abdomen  Extremities: no edema, lower extremities warm to touch. Neurologic: Difficult exam-appears to have mild left-sided weakness.  Skin: No Rash.   Wounds: N/A.    Intake/Output from previous  day:  Intake/Output Summary (Last 24 hours) at 06/18/15 1419 Last data filed at 06/17/15 2100  Gross per 24 hour  Intake      0 ml  Output    200 ml  Net   -200 ml     LAB RESULTS: CBC  Recent Labs Lab 06/17/15 1932 06/17/15 1944  WBC 9.3  --   HGB 12.0* 12.9*  HCT 36.5* 38.0*  PLT 309  --   MCV 91.9  --   MCH 30.2  --   MCHC 32.9  --   RDW 12.7  --   LYMPHSABS 1.2  --   MONOABS 0.9  --   EOSABS 0.1  --   BASOSABS 0.0  --     Chemistries   Recent Labs Lab  06/17/15 1932 06/17/15 1944  NA 142 142  K 3.9 3.7  CL 106 105  CO2 25  --   GLUCOSE 101* 95  BUN 15 18  CREATININE 0.76 0.60*  CALCIUM 8.6*  --     CBG:  Recent Labs Lab 06/17/15 1951 06/18/15 0818  GLUCAP 105* 95    GFR Estimated Creatinine Clearance: 82.4 mL/min (by C-G formula based on Cr of 0.6).  Coagulation profile  Recent Labs Lab 06/17/15 1932  INR 1.26    Cardiac Enzymes No results for input(s): CKMB, TROPONINI, MYOGLOBIN in the last 168 hours.  Invalid input(s): CK  Invalid input(s): POCBNP No results for input(s): DDIMER in the last 72 hours. No results for input(s): HGBA1C in the last 72 hours.  Recent Labs  06/18/15 0223  CHOL 113  HDL 40*  LDLCALC 66  TRIG 33  CHOLHDL 2.8    Recent Labs  06/18/15 0223  TSH 1.062   No results for input(s): VITAMINB12, FOLATE, FERRITIN, TIBC, IRON, RETICCTPCT in the last 72 hours. No results for input(s): LIPASE, AMYLASE in the last 72 hours.  Urine Studies No results for input(s): UHGB, CRYS in the last 72 hours.  Invalid input(s): UACOL, UAPR, USPG, UPH, UTP, UGL, UKET, UBIL, UNIT, UROB, ULEU, UEPI, UWBC, URBC, UBAC, CAST, UCOM, BILUA  MICROBIOLOGY: No results found for this or any previous visit (from the past 240 hour(s)).  RADIOLOGY STUDIES/RESULTS: Ct Head Wo Contrast  06/17/2015  CLINICAL DATA:  Status post fall today.  Initial encounter. EXAM: CT HEAD WITHOUT CONTRAST TECHNIQUE: Contiguous axial images were obtained from the base of the skull through the vertex without intravenous contrast. COMPARISON:  None. FINDINGS: Contusion and laceration over right frontal bone are identified. There is no underlying fracture. The brain is atrophic with chronic microvascular ischemic change. Remote appearing left frontal infarct is noted. No acute intracranial abnormality including hemorrhage, infarct, mass lesion, mass effect, midline shift or abnormal extra-axial fluid collection. IMPRESSION: Contusion  and laceration over the right side of the frontal bone. Negative for fracture or acute intracranial abnormality. Atrophy, chronic microvascular ischemic change and remote left frontal infarct. These results were called by telephone at the time of interpretation on 06/17/2015 at 8:30 pm to Dr. Cathren Laine , who verbally acknowledged these results. Electronically Signed   By: Drusilla Kanner M.D.   On: 06/17/2015 20:30   Ct Cervical Spine Wo Contrast  06/18/2015  CLINICAL DATA:  Pain following fall EXAM: CT CERVICAL SPINE WITHOUT CONTRAST TECHNIQUE: Multidetector CT imaging of the cervical spine was performed without intravenous contrast. Multiplanar CT image reconstructions were also generated. COMPARISON:  None. FINDINGS: There is no demonstrable fracture or spondylolisthesis. The prevertebral soft tissues and  predental space regions are normal. There is moderately severe disc space narrowing at C3-4, C4-5, C6-7, and C7-T1. There is multilevel facet hypertrophy. There is exit foraminal narrowing due to bony hypertrophy at C3-4 bilaterally, C4-5 bilaterally, C5-6 bilaterally, and C6-7 bilaterally. There is mild amount moderate spinal stenosis at C3-4 and C4-5 due to bony hypertrophy. There are foci of carotid artery calcification, more on the left than on the right. There is also atherosclerotic calcification in the aortic arch. IMPRESSION: No apparent fracture or spondylolisthesis. Multifocal osteoarthritic change. Mild to moderate spinal stenosis at C3-4 and C4-5 due to bony hypertrophy. Bilateral carotid artery calcification. Electronically Signed   By: Bretta Bang III M.D.   On: 06/18/2015 10:04   Ct Abdomen Pelvis W Contrast  06/18/2015  CLINICAL DATA:  75 year old male with fall, abdominal pain and distention. EXAM: CT ABDOMEN AND PELVIS WITH CONTRAST TECHNIQUE: Multidetector CT imaging of the abdomen and pelvis was performed using the standard protocol following bolus administration of intravenous  contrast. CONTRAST:  75mL OMNIPAQUE IOHEXOL 300 MG/ML  SOLN COMPARISON:  None. FINDINGS: Lower chest: A tiny right pleural effusion is noted with small bibasilar atelectasis. Cardiomegaly and coronary artery calcifications are present. Hepatobiliary: Multiple cysts are identified. Cholelithiasis is identified without CT evidence of acute cholecystitis. There is no evidence of biliary dilatation. Pancreas: Unremarkable Spleen: Unremarkable Adrenals/Urinary Tract: Mild bilateral renal cortical thinning noted. There is no evidence of renal mass, urinary calculi or hydronephrosis. Stomach/Bowel: The sigmoid colon and rectum are distended with gas and fluid, with the sigmoid colon measuring up to 15 cm in greatest transverse dimension. Mild to moderate distention of the remainder of the colon with stool and gas noted. No definite obstructing lesion identified. There is no evidence of small bowel dilatation. Appendix is normal. Vascular/Lymphatic: Aortic atherosclerotic calcifications noted without aneurysm. No enlarged lymph nodes are identified. Reproductive: Prostate calcifications noted. Other: No free fluid, pneumoperitoneum or abscess. Musculoskeletal: An acute to subacute appearing fracture of the posterior right seventh rib is noted. Fractures involving the sacrum bilaterally, bilateral medial pubic bones, left inferior pubic ramus, and right L5 transverse process are age indeterminate but appear subacute. Heterotopic ossification adjacent to the left inferior pubic ramus fracture is noted. Remote appearing left rib fractures are present. IMPRESSION: Moderate to severe distention of the sigmoid colon and rectum with the sigmoid colon measuring up to 15 cm in greatest transverse dimension. Mild to moderate distention of the remainder of the colon containing stool and gas. No identifiable obstructing process. No evidence of pneumoperitoneum or small bowel distention. Acute to subacute appearing fracture of the  posterior right seventh rib. Tiny right pleural effusion. Fractures involving the sacrum bilaterally, medial pubic bones bilaterally, left inferior pubic ramus and right L5 transverse process - age indeterminate but appears subacute. Correlate with pain in history. Cholelithiasis without CT evidence of acute cholecystitis. Electronically Signed   By: Harmon Pier M.D.   On: 06/18/2015 10:29   Dg Chest Portable 1 View  06/17/2015  CLINICAL DATA:  Head trauma secondary to a fall today. Code stroke. Initial encounter. EXAM: PORTABLE CHEST 1 VIEW COMPARISON:  None. FINDINGS: The lungs are clear. Heart size is upper normal. No pneumothorax or pleural effusion. No focal bony abnormality. Gaseous distention of visualized bowel loops in the upper abdomen noted. IMPRESSION: No acute cardiopulmonary disease. Gaseous distention of visualized bowel loops is nonspecific but may be be due to ileus. Electronically Signed   By: Drusilla Kanner M.D.   On: 06/17/2015 20:10  Jeoffrey Massed, MD  [facility] Pager:[phone]  If 7PM-7AM, please contact night-coverage [email or web address] Password [email or web address] 06/18/2015, 2:19 PM   LOS: 1 day

## 2015-06-18 NOTE — Consult Note (Signed)
Referring Provider:   Dr. Windell Norfolk Primary Care Physician:  No primary care provider on file. Primary Gastroenterologist:  Dr. Evette Cristal  Reason for Consultation:  Colonic distention  HPI: Scott [name] is a 75 y.o. male admitted to the hospital yesterday after sustaining a fall at the group home where he lives, and sustaining a significant laceration to his forehead. This unfortunate gentleman has chronic neurocognitive issues, including schizophrenia and mental retardation.  He was noted to have abdominal distention on admission, so a CT scan was obtained which showed marked colonic dilatation, particularly of the rectosigmoid, and we were asked to see the patient. I have reviewed that film. It does not look to me like a sigmoid volvulus.   Serum potassium is in the low-normal range, but it is noted that the patient is on a number of medications, including Cogentin and tramadol, which could impair colonic contractility and motility.Marland Kitchen  Approximately 10 years ago, he underwent a barium enema that showed a markedly dilated, tortuous colon, so much so that the radiologist was unable to opacify the proximal colon despite using 3 bags of contrast material. There were some concerns about possible narrowing in sections of the colon, so attempts were made on 2 occasions to perform a colonoscopy, in October 2017 and April 2008, but on each occasion, there was noted to be marked fecal retention in the colon despite having consumed the prep for the procedure, to the point where the procedure could not be performed. Finally, in December 2008, colonoscopy was successfully performed to the hepatic flexure, at which time the colon was noted to be tortuous.   The patient is minimally verbal and cannot provide an accurate history, but he certainly has not been in any abdominal distress while in the hospital.  Past Medical History  Diagnosis Date  . Hypothyroid   . Mental retardation   . Schizophrenia, acute (HCC)    . Depression   . Stroke (cerebrum) [facility])     History reviewed. No pertinent past surgical history.  Prior to Admission medications   Medication Sig Start Date End Date Taking? Authorizing Provider  aspirin 325 MG tablet Take 325 mg by mouth daily.   Yes Historical Provider, MD  benztropine (COGENTIN) 0.5 MG tablet Take 0.5 mg by mouth 2 (two) times daily.   Yes Historical Provider, MD  gabapentin (NEURONTIN) 300 MG capsule Take 600 mg by mouth 3 (three) times daily.   Yes Historical Provider, MD  levothyroxine (SYNTHROID, LEVOTHROID) 75 MCG tablet Take 75 mcg by mouth daily before breakfast.   Yes Historical Provider, MD  Omega-3 Fatty Acids (FISH OIL) 1000 MG CAPS Take 1 capsule by mouth 2 (two) times daily.   Yes Historical Provider, MD  PARoxetine (PAXIL) 20 MG tablet Take 20 mg by mouth daily.   Yes Historical Provider, MD  risperiDONE (RISPERDAL) 1 MG tablet Take 1 mg by mouth every morning.   Yes Historical Provider, MD  risperiDONE (RISPERDAL) 2 MG tablet Take 2 mg by mouth every evening.   Yes Historical Provider, MD  traMADol (ULTRAM) 50 MG tablet Take 50 mg by mouth every 6 (six) hours as needed for moderate pain.   Yes Historical Provider, MD    Current Facility-Administered Medications  Medication Dose Route Frequency Provider Last Rate Last Dose  .  stroke: mapping our early stages of recovery book   Does not apply Once Lorretta Harp, MD      . 0.9 %  sodium chloride infusion   Intravenous  Continuous Lorretta Harp, MD 75 mL/hr at 06/18/15 1625    . aspirin suppository 300 mg  300 mg Rectal Daily Lorretta Harp, MD   300 mg at 06/18/15 1113   Or  . aspirin tablet 325 mg  325 mg Oral Daily Lorretta Harp, MD      . benztropine (COGENTIN) tablet 0.5 mg  0.5 mg Oral BID Lorretta Harp, MD   0.5 mg at 06/18/15 1229  . [START ON 06/19/2015] enoxaparin (LOVENOX) injection 40 mg  40 mg Subcutaneous Q24H Shanker Levora Dredge, MD      . gabapentin (NEURONTIN) capsule 600 mg  600 mg Oral TID Lorretta Harp, MD   600  mg at 06/18/15 1623  . levothyroxine (SYNTHROID, LEVOTHROID) tablet 75 mcg  75 mcg Oral QAC breakfast Lorretta Harp, MD   75 mcg at 06/18/15 0800  . omega-3 acid ethyl esters (LOVAZA) capsule 1 g  1 g Oral Daily Lorretta Harp, MD   1 g at 06/18/15 1231  . PARoxetine (PAXIL) tablet 20 mg  20 mg Oral Daily Lorretta Harp, MD   20 mg at 06/18/15 1229  . risperiDONE (RISPERDAL) tablet 1 mg  1 mg Oral q morning - 10a Lorretta Harp, MD   1 mg at 06/18/15 1228  . risperiDONE (RISPERDAL) tablet 2 mg  2 mg Oral QPM Lorretta Harp, MD   2 mg at 06/18/15 0027  . senna-docusate (Senokot-S) tablet 1 tablet  1 tablet Oral QHS PRN Lorretta Harp, MD      . traMADol Janean Sark) tablet 50 mg  50 mg Oral Q6H PRN Lorretta Harp, MD      . white petrolatum (VASELINE) gel   Topical PRN Maretta Bees, MD        Allergies as of 06/17/2015  . (No Known Allergies)    History reviewed. No pertinent family history.  Social History   Social History  . Marital Status: Single    Spouse Name: N/A  . Number of Children: N/A  . Years of Education: N/A   Occupational History  . Not on file.   Social History Main Topics  . Smoking status: Unknown If Ever Smoked  . Smokeless tobacco: Not on file  . Alcohol Use: Not on file  . Drug Use: Not on file  . Sexual Activity: Not on file   Other Topics Concern  . Not on file   Social History Narrative  . No narrative on file    Review of Systems: Unobtainable from this patient  Physical Exam: Vital signs in last 24 hours: Temp:  [97.9 F (36.6 C)-99.5 F (37.5 C)] 98.2 F (36.8 C) (01/27 1200) Pulse Rate:  [64-99] 90 (01/27 1525) Resp:  [15-21] 18 (01/27 1525) BP: (120-164)/(75-105) 147/99 mmHg (01/27 1525) SpO2:  [92 %-99 %] 99 % (01/27 1525) Weight:  [77.111 kg (170 lb)] 77.111 kg (170 lb) (01/26 1935)   General:  The patient is awake and alert and lying in bed in absolutely no distress. He is minimally communicative. Head:  Facial laceration above right eye. Eyes:  Sclera clear,  no icterus.   Conjunctiva pink. Mouth:   No ulcerations or lesions.  Oropharynx pink & moist. Neck:   No masses or thyromegaly. Lungs:  Clear throughout to auscultation.   No wheezes, crackles, or rhonchi. No evident respiratory distress. Heart:   Regular rate and rhythm; no murmurs, clicks, rubs,  or gallops. Abdomen:  Moderately distended, significantly tympanitic consistent with known gas retention in the colon. Fairly soft,  despite distention. No masses, hepatosplenomegaly or ventral hernias noted. Absent bowel sounds, without bruits, guarding, or rebound.   Msk:   Symmetrical without gross deformities. Extremities:   Without clubbing, cyanosis, or edema. Neurologic: Significant chronic cognitive impairment noted, without obvious focality of any neurologic deficits. Skin: Laceration noted as above, no obvious rashes Cervical Nodes:  No significant cervical adenopathy. Psych:  Minimally communicative  Intake/Output from previous day: 01/26 0701 - 01/27 0700 In: 0  Out: 200 [Urine:200] Intake/Output this shift:    Lab Results:  Recent Labs  06/17/15 1932 06/17/15 1944  WBC 9.3  --   HGB 12.0* 12.9*  HCT 36.5* 38.0*  PLT 309  --    BMET  Recent Labs  06/17/15 1932 06/17/15 1944  NA 142 142  K 3.9 3.7  CL 106 105  CO2 25  --   GLUCOSE 101* 95  BUN 15 18  CREATININE 0.76 0.60*  CALCIUM 8.6*  --    LFT  Recent Labs  06/17/15 1932  PROT 6.0*  ALBUMIN 3.3*  AST 20  ALT 12*  ALKPHOS 217*  BILITOT 0.8   PT/INR  Recent Labs  06/17/15 1932  LABPROT 16.0*  INR 1.26    Studies/Results: Ct Head Wo Contrast  06/17/2015  CLINICAL DATA:  Status post fall today.  Initial encounter. EXAM: CT HEAD WITHOUT CONTRAST TECHNIQUE: Contiguous axial images were obtained from the base of the skull through the vertex without intravenous contrast. COMPARISON:  None. FINDINGS: Contusion and laceration over right frontal bone are identified. There is no underlying fracture. The  brain is atrophic with chronic microvascular ischemic change. Remote appearing left frontal infarct is noted. No acute intracranial abnormality including hemorrhage, infarct, mass lesion, mass effect, midline shift or abnormal extra-axial fluid collection. IMPRESSION: Contusion and laceration over the right side of the frontal bone. Negative for fracture or acute intracranial abnormality. Atrophy, chronic microvascular ischemic change and remote left frontal infarct. These results were called by telephone at the time of interpretation on 06/17/2015 at 8:30 pm to Dr. Cathren Laine , who verbally acknowledged these results. Electronically Signed   By: Drusilla Kanner M.D.   On: 06/17/2015 20:30   Ct Cervical Spine Wo Contrast  06/18/2015  CLINICAL DATA:  Pain following fall EXAM: CT CERVICAL SPINE WITHOUT CONTRAST TECHNIQUE: Multidetector CT imaging of the cervical spine was performed without intravenous contrast. Multiplanar CT image reconstructions were also generated. COMPARISON:  None. FINDINGS: There is no demonstrable fracture or spondylolisthesis. The prevertebral soft tissues and predental space regions are normal. There is moderately severe disc space narrowing at C3-4, C4-5, C6-7, and C7-T1. There is multilevel facet hypertrophy. There is exit foraminal narrowing due to bony hypertrophy at C3-4 bilaterally, C4-5 bilaterally, C5-6 bilaterally, and C6-7 bilaterally. There is mild amount moderate spinal stenosis at C3-4 and C4-5 due to bony hypertrophy. There are foci of carotid artery calcification, more on the left than on the right. There is also atherosclerotic calcification in the aortic arch. IMPRESSION: No apparent fracture or spondylolisthesis. Multifocal osteoarthritic change. Mild to moderate spinal stenosis at C3-4 and C4-5 due to bony hypertrophy. Bilateral carotid artery calcification. Electronically Signed   By: Bretta Bang III M.D.   On: 06/18/2015 10:04   Ct Abdomen Pelvis W  Contrast  06/18/2015  CLINICAL DATA:  75 year old male with fall, abdominal pain and distention. EXAM: CT ABDOMEN AND PELVIS WITH CONTRAST TECHNIQUE: Multidetector CT imaging of the abdomen and pelvis was performed using the standard protocol following bolus administration  of intravenous contrast. CONTRAST:  75mL OMNIPAQUE IOHEXOL 300 MG/ML  SOLN COMPARISON:  None. FINDINGS: Lower chest: A tiny right pleural effusion is noted with small bibasilar atelectasis. Cardiomegaly and coronary artery calcifications are present. Hepatobiliary: Multiple cysts are identified. Cholelithiasis is identified without CT evidence of acute cholecystitis. There is no evidence of biliary dilatation. Pancreas: Unremarkable Spleen: Unremarkable Adrenals/Urinary Tract: Mild bilateral renal cortical thinning noted. There is no evidence of renal mass, urinary calculi or hydronephrosis. Stomach/Bowel: The sigmoid colon and rectum are distended with gas and fluid, with the sigmoid colon measuring up to 15 cm in greatest transverse dimension. Mild to moderate distention of the remainder of the colon with stool and gas noted. No definite obstructing lesion identified. There is no evidence of small bowel dilatation. Appendix is normal. Vascular/Lymphatic: Aortic atherosclerotic calcifications noted without aneurysm. No enlarged lymph nodes are identified. Reproductive: Prostate calcifications noted. Other: No free fluid, pneumoperitoneum or abscess. Musculoskeletal: An acute to subacute appearing fracture of the posterior right seventh rib is noted. Fractures involving the sacrum bilaterally, bilateral medial pubic bones, left inferior pubic ramus, and right L5 transverse process are age indeterminate but appear subacute. Heterotopic ossification adjacent to the left inferior pubic ramus fracture is noted. Remote appearing left rib fractures are present. IMPRESSION: Moderate to severe distention of the sigmoid colon and rectum with the sigmoid  colon measuring up to 15 cm in greatest transverse dimension. Mild to moderate distention of the remainder of the colon containing stool and gas. No identifiable obstructing process. No evidence of pneumoperitoneum or small bowel distention. Acute to subacute appearing fracture of the posterior right seventh rib. Tiny right pleural effusion. Fractures involving the sacrum bilaterally, medial pubic bones bilaterally, left inferior pubic ramus and right L5 transverse process - age indeterminate but appears subacute. Correlate with pain in history. Cholelithiasis without CT evidence of acute cholecystitis. Electronically Signed   By: Harmon Pier M.D.   On: 06/18/2015 10:29   Dg Chest Portable 1 View  06/17/2015  CLINICAL DATA:  Head trauma secondary to a fall today. Code stroke. Initial encounter. EXAM: PORTABLE CHEST 1 VIEW COMPARISON:  None. FINDINGS: The lungs are clear. Heart size is upper normal. No pneumothorax or pleural effusion. No focal bony abnormality. Gaseous distention of visualized bowel loops in the upper abdomen noted. IMPRESSION: No acute cardiopulmonary disease. Gaseous distention of visualized bowel loops is nonspecific but may be be due to ileus. Electronically Signed   By: Drusilla Kanner M.D.   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 There is obviously a long-standing problem with colonic dilatation and fecal obstipation, as evidenced by his barium enema and colonoscopies 10 years ago. There is no evidence that the patient is compromised by his current colonic distention, in terms of abdominal discomfort. I see no reason to perform decompression or, for that matter, bowel evacuation.  It has been my experience that patients with this degree of chronic colonic distention are unlikely to derive long-term benefit from  attempts at decompression. Therefore, if the patient became clinically compromised by his colonic distention at some point in the future (which certainly does not seem to be the case at present), he might benefit from transient colonoscopic decompression, followed by a loop colostomy for ongoing decompression (the optimal procedure would be a subtotal colectomy with ileoproctostomy, but this patient is probably too  old and infirm to undergo such a radical procedure).  Plan: No specific interventions are recommended. I will sign off at this time. However, please feel free to call me if you have further questions about his case, or if I can be of further assistance.   LOS: 1 day   Teara Duerksen V  06/18/2015, 4:35 PM   Pager [phone] If no answer or after 5 PM call [phone]

## 2015-06-18 NOTE — Evaluation (Signed)
Physical Therapy Evaluation Patient Details Name: Scott [name] MRN: [id number] DOB: 1941/03/11 Today's Date: 06/18/2015   History of Present Illness  Patient is a 75 yo male admitted 06/17/15 with AMS, fall, slurred speech.  PMH:  Depression, mental retardation, schizophrenia, CVA  Clinical Impression  Patient presents with problems listed below. Patient will benefit from acute PT to maximize mobility prior to discharge.  Recommend SNF at d/c for continued therapy at discharge.    Follow Up Recommendations SNF;Supervision/Assistance - 24 hour    Equipment Recommendations  None recommended by PT    Recommendations for Other Services       Precautions / Restrictions Precautions Precautions: Fall Precaution Comments: Per patient's sister, patient was very unsteady Restrictions Weight Bearing Restrictions: No      Mobility  Bed Mobility Overal bed mobility: Needs Assistance Bed Mobility: Rolling Rolling: Mod assist         General bed mobility comments: Mod assist to roll.  Requires +2 assist to scoot up in bed.    Transfers                 General transfer comment: NT.  Rectal tube placed.  Ambulation/Gait                Stairs            Wheelchair Mobility    Modified Rankin (Stroke Patients Only)       Balance                                             Pertinent Vitals/Pain Pain Assessment: No/denies pain    Home Living Family/patient expects to be discharged to:: Skilled nursing facility     Type of Home: Group Home                Prior Function           Comments: Unsure.  Sister last saw patient at Christmas at which time he was ambulatory but unsteady.  Unable to reach group home.     Hand Dominance        Extremity/Trunk Assessment   Upper Extremity Assessment: Generalized weakness;Difficult to assess due to impaired cognition (RUE grossly 3+/5; LUE grossly 3-/5)           Lower  Extremity Assessment: Generalized weakness;Difficult to assess due to impaired cognition (RLE grossly 4/5; LLE grossly 3-/5 to 2+/5)         Communication   Communication: Expressive difficulties (Minimal verbalizations)  Cognition Arousal/Alertness: Awake/alert Behavior During Therapy: Flat affect Overall Cognitive Status: No family/caregiver present to determine baseline cognitive functioning (H/o Mental retardation per chart. Follows some commands)                      General Comments      Exercises        Assessment/Plan    PT Assessment Patient needs continued PT services  PT Diagnosis Difficulty walking;Generalized weakness;Altered mental status;Hemiplegia non-dominant side   PT Problem List Decreased strength;Decreased activity tolerance;Decreased balance;Decreased mobility;Decreased cognition;Decreased knowledge of use of DME;Decreased safety awareness  PT Treatment Interventions DME instruction;Gait training;Functional mobility training;Therapeutic activities;Therapeutic exercise;Patient/family education   PT Goals (Current goals can be found in the Care Plan section) Acute Rehab PT Goals Patient Stated Goal: None stated PT Goal Formulation: Patient unable to participate in goal setting  Time For Goal Achievement: 2015/07/27 Potential to Achieve Goals: Fair    Frequency Min 2X/week   Barriers to discharge        Co-evaluation               End of Session   Activity Tolerance: No increased pain Patient left: in bed;with call bell/phone within reach ([place] on BUE's)           Time: 4098-1191 PT Time Calculation (min) (ACUTE ONLY): 10 min   Charges:   PT Evaluation $PT Eval Moderate Complexity: 1 Procedure     PT G CodesVena Austria 07-13-2015, 6:26 PM Durenda Hurt. Renaldo Fiddler, [facility] Acute Rehab Services Pager [phone]

## 2015-06-19 ENCOUNTER — Inpatient Hospital Stay (HOSPITAL_COMMUNITY): Payer: Medicare Other

## 2015-06-19 DIAGNOSIS — J9601 Acute respiratory failure with hypoxia: Secondary | ICD-10-CM

## 2015-06-19 DIAGNOSIS — J690 Pneumonitis due to inhalation of food and vomit: Secondary | ICD-10-CM

## 2015-06-19 DIAGNOSIS — I63411 Cerebral infarction due to embolism of right middle cerebral artery: Principal | ICD-10-CM

## 2015-06-19 LAB — BASIC METABOLIC PANEL
ANION GAP: 7 (ref 5–15)
BUN: 8 mg/dL (ref 6–20)
CO2: 25 mmol/L (ref 22–32)
Calcium: 8.5 mg/dL — ABNORMAL LOW (ref 8.9–10.3)
Chloride: 107 mmol/L (ref 101–111)
Creatinine, Ser: 0.68 mg/dL (ref 0.61–1.24)
GLUCOSE: 124 mg/dL — AB (ref 65–99)
POTASSIUM: 3.3 mmol/L — AB (ref 3.5–5.1)
Sodium: 139 mmol/L (ref 135–145)

## 2015-06-19 LAB — HEMOGLOBIN A1C
Hgb A1c MFr Bld: 6.1 % — ABNORMAL HIGH (ref 4.8–5.6)
MEAN PLASMA GLUCOSE: 128 mg/dL

## 2015-06-19 LAB — CBC
HEMATOCRIT: 39.9 % (ref 39.0–52.0)
Hemoglobin: 13.6 g/dL (ref 13.0–17.0)
MCH: 31.5 pg (ref 26.0–34.0)
MCHC: 34.1 g/dL (ref 30.0–36.0)
MCV: 92.4 fL (ref 78.0–100.0)
PLATELETS: 306 10*3/uL (ref 150–400)
RBC: 4.32 MIL/uL (ref 4.22–5.81)
RDW: 12.7 % (ref 11.5–15.5)
WBC: 14.2 10*3/uL — AB (ref 4.0–10.5)

## 2015-06-19 LAB — GLUCOSE, CAPILLARY: Glucose-Capillary: 116 mg/dL — ABNORMAL HIGH (ref 65–99)

## 2015-06-19 MED ORDER — MORPHINE SULFATE (PF) 2 MG/ML IV SOLN
1.0000 mg | INTRAVENOUS | Status: DC | PRN
  Administered 2015-06-19 – 2015-06-23 (×5): 1 mg via INTRAVENOUS
  Filled 2015-06-19 (×6): qty 1

## 2015-06-19 MED ORDER — POTASSIUM CHLORIDE CRYS ER 20 MEQ PO TBCR
40.0000 meq | EXTENDED_RELEASE_TABLET | Freq: Once | ORAL | Status: DC
  Filled 2015-06-19: qty 2

## 2015-06-19 MED ORDER — SODIUM CHLORIDE 0.9 % IV SOLN
3.0000 g | Freq: Four times a day (QID) | INTRAVENOUS | Status: DC
  Administered 2015-06-19 – 2015-06-23 (×16): 3 g via INTRAVENOUS
  Filled 2015-06-19 (×20): qty 3

## 2015-06-19 MED ORDER — IOHEXOL 350 MG/ML SOLN
50.0000 mL | Freq: Once | INTRAVENOUS | Status: AC | PRN
  Administered 2015-06-19: 50 mL via INTRAVENOUS

## 2015-06-19 MED ORDER — GLYCOPYRROLATE 0.2 MG/ML IJ SOLN
0.2000 mg | Freq: Three times a day (TID) | INTRAMUSCULAR | Status: DC
  Administered 2015-06-19 – 2015-06-20 (×6): 0.2 mg via INTRAVENOUS
  Filled 2015-06-19 (×9): qty 1

## 2015-06-19 MED ORDER — LEVOTHYROXINE SODIUM 100 MCG IV SOLR
37.5000 ug | Freq: Every day | INTRAVENOUS | Status: DC
  Administered 2015-06-19 – 2015-06-21 (×3): 37.5 ug via INTRAVENOUS
  Filled 2015-06-19 (×4): qty 5

## 2015-06-19 MED ORDER — ATROPINE SULFATE 1 % OP SOLN
2.0000 [drp] | Freq: Four times a day (QID) | OPHTHALMIC | Status: DC
  Administered 2015-06-19 – 2015-06-20 (×7): 2 [drp] via SUBLINGUAL
  Filled 2015-06-19: qty 2

## 2015-06-19 NOTE — Progress Notes (Signed)
Called by RN to NTS pt.  [facility] w/ rhonchi- loudest in the throat area.  Sx for large amt tan thick secretions.  Post suctioning, [facility] coarse diminished w/ no rhonchi noted.  MD at bedside and per MD- pt is DNR now.

## 2015-06-19 NOTE — Progress Notes (Signed)
SLP Cancellation Note  Patient Details Name: Scott [name] MRN: [id number] DOB: 10/25/1940   Cancelled treatment:       Reason Eval/Treat Not Completed: Medical issues which prohibited therapy; family and nursing both report reduced mentation, worsening from previous ST encounter; pt not appropriate for anything by mouth; recommend medicines via alternative means  Marcene Duos MA, CCC-SLP Acute Care Speech Language Pathologist    [name] 06/19/2015, 1:40 PM

## 2015-06-19 NOTE — Consult Note (Signed)
ANTIBIOTIC CONSULT NOTE - INITIAL  Pharmacy Consult for Unasyn Indication: aspiration pneumonia  No Known Allergies  Labs:  Recent Labs  06/17/15 1932 06/17/15 1944 06/19/15 0610  WBC 9.3  --  14.2*  HGB 12.0* 12.9* 13.6  PLT 309  --  306  CREATININE 0.76 0.60* 0.68   Estimated Creatinine Clearance: 82.4 mL/min (by C-G formula based on Cr of 0.68).  Assessment: 75 YO male admitted with CVA experiencing dysphagia. Concern for aspiration pneumonia. WBC now elevated, afebrile.  Goal of Therapy:  Eradication of infection  Plan:  Unasyn 3 g IV q6h Monitor renal function, clinical progression  Greggory Stallion, PharmD Clinical Pharmacy Resident Pager # [phone] 06/19/2015 11:02 AM

## 2015-06-19 NOTE — Progress Notes (Signed)
PATIENT DETAILS Name: Scott [name] Age: 75 y.o. Sex: male Date of Birth: 02-18-1941 Admit Date: 06/17/2015 Admitting Physician Lorretta Harp, MD PCP:No primary care provider on file.  Subjective: Very lethargic-hardly opens eyes. Gurgling-pooling secretions this morning. This is a dramatic change from yesterday.  Assessment/Plan: Principal Problem: Acute hypoxic respiratory failure: In the setting of CVA, dysphagia, and now aspiration pneumonia. Very poor overall prognoses, spoke to patient's sister Mrs. Lamb over phone-DO NOT RESUSCITATE. She wants [place] to slowly start focusing more on comfort, if no improvement in the next few days, will likely need transition to comfort care. For now, continue to monitor in this telemetry unit-family aware that no point in moving to a higher level of care. Start Robinul to help with secretions, stat morphine as needed for comfort, prn nasotracheal suctioning. Palliative care consulted.  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 Carotid Doppler showed more than 80% stenosis of the left internal carotid artery, echocardiogram showed preserved ejection fraction without any embolic source. Telemetry shows sinus rhythm without any evidence of A. fib. Given the rapid deterioration overnight, could have extended his acute CVA. Spoke with neurology, recommends a stat CT head, CT angiogram of the neck and head also ordered. But suspect that given patient's overall poor prognoses, doubt any of these studies are going to change management. Suspect if no improvement in the next few days, best served by transitioning to full comfort measures. Patient's family is aware.  Dysphagia: Secondary to above. Patient is actively pooling secretions-he is very  lethargic-he will likely not be able to maintain his airway for long. Suspect that artificial feeding in the form of NG tube/PEG tube will not minimize aspiration-therefore of very limited use in this setting. Keep nothing by mouth, hold all medications. If no improvement in the next few days, suspect best transitioned to full comfort measures.  Presumed aspiration pneumonia: Start Unasyn. Given degree of ongoing aspiration-and inability to protect airway-suspect will continue to deteriorate. As noted above, we will watch for day or 2, if no improvement will need to be transitioned to full comfort measures. Keep nothing by mouth.  Distended rectum/sigmoid colon: Secondary to colonic dysmotility-per GI this is commonly seen in institutionalized psychiatry patient's. Do not feel that this is clinically relevant-especially given above noted issues. Has a flexiseal in place for comfort.  Hypothyroidism: Continue Synthroid-change to IV  History of mental retardation/schizophrenia: Hold oral medications given inability to take oral intake.   Fractures involving the sacrum bilaterally, medial pubic bones bilaterally, left inferior pubic ramus and right L5 transverse process: Age indeterminate but most likely subacute-per radiology report.Suspect secondary to numerous recent falls. Supportive care only at this time.   Laceration right forehead: Sutured in the emergency room,tDap x 1.  Palliative care: Dramatic deterioration overnight, patient very lethargic and is actively pooling secretions-suspect is aspirating. Very hypoxic. Suspect this is likely secondary to acute CVA with extension. Given history of mental retardation, poor overall health-do not think patient is served by aggressive care in this setting. I spoke to Mrs. Lamb-patient's sister/HPOA-we had discussed DO NOT RESUSCITATE yesterday-she is now ready to proceed with DO NOT RESUSCITATE status. Her overall goal is to keep this patient comfortable.  We will watch/observe patient for a day or  two-if no improvement, she is aware that we will need to transition to full comfort measures.  Disposition: Remain inpatient  Antimicrobial agents  See below  Anti-infectives    None      DVT Prophylaxis: SCD's  Code Status:  DNR  Family Communication  Mrs. Lamb-[phone]  CONSULTS:  neurology  Time spent 40 minutes-Greater than 50% of this time was spent in counseling, explanation of diagnosis, planning of further management, and coordination of care.  MEDICATIONS: Scheduled Meds: .  stroke: mapping our early stages of recovery book   Does not apply Once  . aspirin  300 mg Rectal Daily   Or  . aspirin  325 mg Oral Daily  . glycopyrrolate  0.2 mg Intravenous TID  . levothyroxine  37.5 mcg Intravenous QAC breakfast  . LORazepam  1 mg Intravenous Once  . omega-3 acid ethyl esters  1 g Oral Daily   Continuous Infusions:  PRN Meds:.morphine injection, senna-docusate, traMADol, white petrolatum    PHYSICAL EXAM: Vital signs in last 24 hours: Filed Vitals:   06/19/15 0623 06/19/15 0841 06/19/15 0842 06/19/15 0910  BP: 131/67 114/67  138/99  Pulse: 87 100  99  Temp: 98.3 F (36.8 C)  98.7 F (37.1 C)   TempSrc: Oral  Axillary   Resp: Height:      Weight:      SpO2: 97% 77% 90% 93%    Weight change:  Filed Weights   06/17/15 1935  Weight: 77.111 kg (170 lb)   Body mass index is 24.39 kg/(m^2).   Gen Exam: very lethargic, barely opens eyes to sternal rub. Gurgling and actively pooling secretions. Neck: Supple, Chest:  transmitted upper airway sounds-very hard to hear anything else.  CVS: S1 S2 Regular Abdomen: soft, BS +, non tender, appears more  distended today .  Extremities: no edema Neurologic:  difficult exam-moves all 4 extremities to pain-but appears to have slight left-sided weakness   Intake/Output from previous day:  Intake/Output Summary (Last 24 hours) at 06/19/15 1023 Last data  filed at 06/19/15 0400  Gross per 24 hour  Intake      0 ml  Output   1400 ml  Net  -1400 ml     LAB RESULTS: CBC  Recent Labs Lab 06/17/15 1932 06/17/15 1944 06/19/15 0610  WBC 9.3  --  14.2*  HGB 12.0* 12.9* 13.6  HCT 36.5* 38.0* 39.9  PLT 309  --  306  MCV 91.9  --  92.4  MCH 30.2  --  31.5  MCHC 32.9  --  34.1  RDW 12.7  --  12.7  LYMPHSABS 1.2  --   --   MONOABS 0.9  --   --   EOSABS 0.1  --   --   BASOSABS 0.0  --   --     Chemistries   Recent Labs Lab 06/17/15 1932 06/17/15 1944 06/19/15 0610  NA 142 142 139  K 3.9 3.7 3.3*  CL 106 105 107  CO2 25  --  25  GLUCOSE 101* 95 124*  BUN CREATININE 0.76 0.60* 0.68  CALCIUM 8.6*  --  8.5*    CBG:  Recent Labs Lab 06/17/15 1951 06/18/15 0818 06/19/15 0639  GLUCAP 105* 95 116*    GFR Estimated Creatinine Clearance: 82.4 mL/min (by C-G formula based on Cr of 0.68).  Coagulation profile  Recent Labs Lab 06/17/15 1932  INR 1.26    Cardiac Enzymes  No results for input(s): CKMB, TROPONINI, MYOGLOBIN in the last 168 hours.  Invalid input(s): CK  Invalid input(s): POCBNP No results for input(s): DDIMER in the last 72 hours.  Recent Labs  06/18/15 0223  HGBA1C 6.1*    Recent Labs  06/18/15 0223  CHOL 113  HDL 40*  LDLCALC 66  TRIG 33  CHOLHDL 2.8    Recent Labs  06/18/15 0223  TSH 1.062   No results for input(s): VITAMINB12, FOLATE, FERRITIN, TIBC, IRON, RETICCTPCT in the last 72 hours. No results for input(s): LIPASE, AMYLASE in the last 72 hours.  Urine Studies No results for input(s): UHGB, CRYS in the last 72 hours.  Invalid input(s): UACOL, UAPR, USPG, UPH, UTP, UGL, UKET, UBIL, UNIT, UROB, ULEU, UEPI, UWBC, URBC, UBAC, CAST, UCOM, BILUA  MICROBIOLOGY: No results found for this or any previous visit (from the past 240 hour(s)).  RADIOLOGY STUDIES/RESULTS: Ct Head Wo Contrast  06/17/2015  CLINICAL DATA:  Status post fall today.  Initial encounter. EXAM:  CT HEAD WITHOUT CONTRAST TECHNIQUE: Contiguous axial images were obtained from the base of the skull through the vertex without intravenous contrast. COMPARISON:  None. FINDINGS: Contusion and laceration over right frontal bone are identified. There is no underlying fracture. The brain is atrophic with chronic microvascular ischemic change. Remote appearing left frontal infarct is noted. No acute intracranial abnormality including hemorrhage, infarct, mass lesion, mass effect, midline shift or abnormal extra-axial fluid collection. IMPRESSION: Contusion and laceration over the right side of the frontal bone. Negative for fracture or acute intracranial abnormality. Atrophy, chronic microvascular ischemic change and remote left frontal infarct. These results were called by telephone at the time of interpretation on 06/17/2015 at 8:30 pm to Dr. Cathren Laine , who verbally acknowledged these results. Electronically Signed   By: Drusilla Kanner M.D.   On: 06/17/2015 20:30   Ct Cervical Spine Wo Contrast  06/18/2015  CLINICAL DATA:  Pain following fall EXAM: CT CERVICAL SPINE WITHOUT CONTRAST TECHNIQUE: Multidetector CT imaging of the cervical spine was performed without intravenous contrast. Multiplanar CT image reconstructions were also generated. COMPARISON:  None. FINDINGS: There is no demonstrable fracture or spondylolisthesis. The prevertebral soft tissues and predental space regions are normal. There is moderately severe disc space narrowing at C3-4, C4-5, C6-7, and C7-T1. There is multilevel facet hypertrophy. There is exit foraminal narrowing due to bony hypertrophy at C3-4 bilaterally, C4-5 bilaterally, C5-6 bilaterally, and C6-7 bilaterally. There is mild amount moderate spinal stenosis at C3-4 and C4-5 due to bony hypertrophy. There are foci of carotid artery calcification, more on the left than on the right. There is also atherosclerotic calcification in the aortic arch. IMPRESSION: No apparent fracture or  spondylolisthesis. Multifocal osteoarthritic change. Mild to moderate spinal stenosis at C3-4 and C4-5 due to bony hypertrophy. Bilateral carotid artery calcification. Electronically Signed   By: Bretta Bang III M.D.   On: 06/18/2015 10:04   Mr Laqueta Jean ZO Contrast  06/18/2015  CLINICAL DATA:  Initial evaluation for fall, altered mental status, slurred speech. History of schizophrenia, depression. EXAM: MRI HEAD WITHOUT AND WITH CONTRAST TECHNIQUE: Multiplanar, multiecho pulse sequences of the brain and surrounding structures were obtained without and with intravenous contrast. CONTRAST:  15mL MULTIHANCE GADOBENATE DIMEGLUMINE 529 MG/ML IV SOLN COMPARISON:  Prior CT from 06/17/2015. FINDINGS: Diffuse prominence of the CSF containing spaces is compatible with generalized atrophy, moderate for age. Patchy and confluent T2/FLAIR hyperintensity within the periventricular and deep white matter most consistent with chronic small vessel ischemic  changes, also moderate in nature. Small vessel type changes present within the pons. Focal encephalomalacia within the anterior left frontal lobe compatible with remote infarct. Additional small remote bilateral cerebellar infarcts noted. Scatter remote lacunar infarcts involve the bilateral basal ganglia. Confluent restricted diffusion involving the anterior right operculum the with extension into the right insular cortex and subinsular white matter consistent with acute right MCA territory infarct. Additional patchy infarct within knee right basal ganglia/corona radiata out. It is patchy cortical infarcts more posteriorly within the right parietal and occipital lobes. Additional small cortical infarct within the parasagittal left parietal lobe. There is gyral swelling and edema on T2/FLAIR sequences without significant mass effect. Mild associated petechial hemorrhage within the right parietal lobe (Series 7, image 18). No other evidence for hemorrhage. Abnormal flow void  within the left ICA and MCA, which may reflect slow flow and/or atheromatous disease. The left ICA itself may be occluded, although the left MCA is felt to be at least partially patent. Remainder of the intracranial vascular flow voids are patent, although atheromatous disease noted within the vertebrobasilar system. No mass lesion, midline shift, or mass effect. Ventricular prominence related to global parenchymal volume loss without hydrocephalus. No extra-axial fluid collection. No abnormal enhancement following contrast administration. Craniocervical junction within normal limits. Multilevel degenerative spondylolysis noted within the upper cervical spine. There is apparent moderate to severe canal stenosis at the C3-4 level. Pituitary gland within normal limits. No acute abnormality about the orbits. Mild scattered mucosal thickening within the ethmoidal air cells. Minimal layering opacity within the right sphenoid sinus. No significant mastoid effusion. Inner ear structures grossly normal. Bone marrow signal intensity within normal limits. Right frontal scalp contusion present. IMPRESSION: 1. Patchy moderate sized multi focal right MCA territory ischemic infarcts as above. Additional small cortical ischemic infarct within the parasagittal left parietal lobe. 2. Remote left MCA territory infarct with additional remote lacunar infarcts involving the bilateral basal ganglia and cerebellar hemispheres. 3. Abnormal flow void within the left ICA and to a lesser extent left MCA, which may reflect slow flow and/or partial occlusion or occlusion. This is suspected to likely be chronic in nature given the remote left MCA territory infarct. 4. Moderate atrophy with chronic small vessel ischemic disease. 5. Right frontal scalp contusion. Electronically Signed   By: Rise Mu M.D.   On: 06/18/2015 22:12   Ct Abdomen Pelvis W Contrast  06/18/2015  CLINICAL DATA:  75 year old male with fall, abdominal pain and  distention. EXAM: CT ABDOMEN AND PELVIS WITH CONTRAST TECHNIQUE: Multidetector CT imaging of the abdomen and pelvis was performed using the standard protocol following bolus administration of intravenous contrast. CONTRAST:  75mL OMNIPAQUE IOHEXOL 300 MG/ML  SOLN COMPARISON:  None. FINDINGS: Lower chest: A tiny right pleural effusion is noted with small bibasilar atelectasis. Cardiomegaly and coronary artery calcifications are present. Hepatobiliary: Multiple cysts are identified. Cholelithiasis is identified without CT evidence of acute cholecystitis. There is no evidence of biliary dilatation. Pancreas: Unremarkable Spleen: Unremarkable Adrenals/Urinary Tract: Mild bilateral renal cortical thinning noted. There is no evidence of renal mass, urinary calculi or hydronephrosis. Stomach/Bowel: The sigmoid colon and rectum are distended with gas and fluid, with the sigmoid colon measuring up to 15 cm in greatest transverse dimension. Mild to moderate distention of the remainder of the colon with stool and gas noted. No definite obstructing lesion identified. There is no evidence of small bowel dilatation. Appendix is normal. Vascular/Lymphatic: Aortic atherosclerotic calcifications noted without aneurysm. No enlarged lymph nodes are identified. Reproductive:  Prostate calcifications noted. Other: No free fluid, pneumoperitoneum or abscess. Musculoskeletal: An acute to subacute appearing fracture of the posterior right seventh rib is noted. Fractures involving the sacrum bilaterally, bilateral medial pubic bones, left inferior pubic ramus, and right L5 transverse process are age indeterminate but appear subacute. Heterotopic ossification adjacent to the left inferior pubic ramus fracture is noted. Remote appearing left rib fractures are present. IMPRESSION: Moderate to severe distention of the sigmoid colon and rectum with the sigmoid colon measuring up to 15 cm in greatest transverse dimension. Mild to moderate  distention of the remainder of the colon containing stool and gas. No identifiable obstructing process. No evidence of pneumoperitoneum or small bowel distention. Acute to subacute appearing fracture of the posterior right seventh rib. Tiny right pleural effusion. Fractures involving the sacrum bilaterally, medial pubic bones bilaterally, left inferior pubic ramus and right L5 transverse process - age indeterminate but appears subacute. Correlate with pain in history. Cholelithiasis without CT evidence of acute cholecystitis. Electronically Signed   By: Harmon Pier M.D.   On: 06/18/2015 10:29   Dg Chest Port 1 View  06/19/2015  CLINICAL DATA:  Shortness of breath EXAM: PORTABLE CHEST 1 VIEW COMPARISON:  06/17/2015 FINDINGS: Stable heart size with central vascular congestion and basilar atelectasis evident. No definite edema pattern or CHF. No focal pneumonia, collapse or consolidation. No effusion or pneumothorax. Trachea midline. Atherosclerosis of the aorta. Diffuse distention of the bowel in the epigastric region compatible with ileus. IMPRESSION: Increased vascular congestion and basilar atelectasis. Gaseous distention in the upper abdomen suspicious for ileus. Electronically Signed   By: Judie Petit.  Shick M.D.   On: 06/19/2015 09:00   Dg Chest Portable 1 View  06/17/2015  CLINICAL DATA:  Head trauma secondary to a fall today. Code stroke. Initial encounter. EXAM: PORTABLE CHEST 1 VIEW COMPARISON:  None. FINDINGS: The lungs are clear. Heart size is upper normal. No pneumothorax or pleural effusion. No focal bony abnormality. Gaseous distention of visualized bowel loops in the upper abdomen noted. IMPRESSION: No acute cardiopulmonary disease. Gaseous distention of visualized bowel loops is nonspecific but may be be due to ileus. Electronically Signed   By: Drusilla Kanner M.D.   On: 06/17/2015 Louis Matte, MD  [facility] Pager:[phone]  If 7PM-7AM, please contact  night-coverage [email or web address] Password [email or web address] 06/19/2015, 10:23 AM   LOS: 2 days

## 2015-06-19 NOTE — Progress Notes (Signed)
Noted patient's oxygen is low, patient was put on 100% non re-breather at this time. Will continue to monitor.

## 2015-06-19 NOTE — Progress Notes (Signed)
STROKE TEAM PROGRESS NOTE   SUBJECTIVE (INTERVAL HISTORY) The patient's sister (POA) and another family member were at the bedside. The patient's nurse later came into the room and confirmed that the patient appeared neurologically worse today. The patient was poorly responsive. CT head showed right frontal stroke corresponding to MRI but no new abnormalities. CTA showed left ICA 90% stenosis. Discussed wit sister (POA) and she is leaning towards comfort care but would like to discuss with another sister.   OBJECTIVE Temp:  [98 F (36.7 C)-99.1 F (37.3 C)] 99.1 F (37.3 C) (01/28 1347) Pulse Rate:  [67-101] 101 (01/28 1347) Cardiac Rhythm:  [-] Normal sinus rhythm (01/28 0700) Resp:  [18-22] 20 (01/28 1347) BP: (114-148)/(67-99) 119/68 mmHg (01/28 1347) SpO2:  [77 %-99 %] 91 % (01/28 1347)  CBC:   Recent Labs Lab 06/17/15 1932 06/17/15 1944 06/19/15 0610  WBC 9.3  --  14.2*  NEUTROABS 7.1  --   --   HGB 12.0* 12.9* 13.6  HCT 36.5* 38.0* 39.9  MCV 91.9  --  92.4  PLT 309  --  306    Basic Metabolic Panel:   Recent Labs Lab 06/17/15 1932 06/17/15 1944 06/19/15 0610  NA 142 142 139  K 3.9 3.7 3.3*  CL 106 105 107  CO2 25  --  25  GLUCOSE 101* 95 124*  BUN CREATININE 0.76 0.60* 0.68  CALCIUM 8.6*  --  8.5*    Lipid Panel:     Component Value Date/Time   CHOL 113 06/18/2015 0223   TRIG 33 06/18/2015 0223   HDL 40* 06/18/2015 0223   CHOLHDL 2.8 06/18/2015 0223   VLDL 7 06/18/2015 0223   LDLCALC 66 06/18/2015 0223   HgbA1c:  Lab Results  Component Value Date   HGBA1C 6.1* 06/18/2015   Urine Drug Screen:     Component Value Date/Time   LABOPIA NONE DETECTED 06/17/2015 2050   COCAINSCRNUR NONE DETECTED 06/17/2015 2050   LABBENZ NONE DETECTED 06/17/2015 2050   AMPHETMU NONE DETECTED 06/17/2015 2050   THCU NONE DETECTED 06/17/2015 2050   LABBARB NONE DETECTED 06/17/2015 2050      IMAGING I have personally reviewed the radiological images  below and agree with the radiology interpretations.  Ct Head Wo Contrast 06/17/2015    Contusion and laceration over the right side of the frontal bone. Negative for fracture or acute intracranial abnormality. Atrophy, chronic microvascular ischemic change and remote left frontal infarct.   Ct Cervical Spine Wo Contrast 06/18/2015    No apparent fracture or spondylolisthesis. Multifocal osteoarthritic change. Mild to moderate spinal stenosis at C3-4 and C4-5 due to bony hypertrophy. Bilateral carotid artery calcification.   Ct Abdomen Pelvis W Contrast 06/18/2015    Moderate to severe distention of the sigmoid colon and rectum with the sigmoid colon measuring up to 15 cm in greatest transverse dimension. Mild to moderate distention of the remainder of the colon containing stool and gas. No identifiable obstructing process. No evidence of pneumoperitoneum or small bowel distention. Acute to subacute appearing fracture of the posterior right seventh rib. Tiny right pleural effusion. Fractures involving the sacrum bilaterally, medial pubic bones bilaterally, left inferior pubic ramus and right L5 transverse process - age indeterminate but appears subacute. Correlate with pain in history. Cholelithiasis without CT evidence of acute cholecystitis.   Dg Chest Portable 1 View 06/17/2015    No acute cardiopulmonary disease. Gaseous distention of visualized bowel loops is nonspecific but may be be due  to ileus.   MRI of the brain with and without contrast 06/18/2015 1. Patchy moderate sized multi focal right MCA territory ischemic infarcts as above. Additional small cortical ischemic infarct within the parasagittal left parietal lobe. 2. Remote left MCA territory infarct with additional remote lacunar infarcts involving the bilateral basal ganglia and cerebellar hemispheres. 3. Abnormal flow void within the left ICA and to a lesser extent left MCA, which may reflect slow flow and/or partial occlusion or  occlusion. This is suspected to likely be chronic in nature given the remote left MCA territory infarct. 4. Moderate atrophy with chronic small vessel ischemic disease. 5. Right frontal scalp contusion.  CT angiogram of the head and neck 06/19/2015 Low-density has now developed in the right frontal lobe in the region of acute infarction. No evidence of hemorrhage or mass effect. No right carotid bifurcation stenosis. Severe stenosis of the distal bulb on the left, 90% or greater, with diminished flow beyond that. No intracranial large or medium vessel occlusion or visible missing branches.  TTE - - Left ventricle: The cavity size was normal. Wall thickness was normal. Systolic function was normal. The estimated ejection fraction was in the range of 55% to 60%. Although no diagnostic regional wall motion abnormality was identified, this possibility cannot be completely excluded on the basis of this study. Doppler parameters are consistent with abnormal left ventricular relaxation (grade 1 diastolic dysfunction). - Atrial septum: There was an atrial septal aneurysm.  CUS - Findings suggest 1-39% right internal carotid artery stenosis and >80% left internal carotid artery stenosis. Vertebral arteries are patent with antegrade flow.   PHYSICAL EXAM  Temp:  [97.9 F (36.6 C)-99.7 F (37.6 C)] 97.9 F (36.6 C) (01/28 2143) Pulse Rate:  [75-102] 100 (01/28 2143) Resp:  [18-22] 22 (01/28 2143) BP: (114-138)/(67-99) 131/99 mmHg (01/28 2143) SpO2:  [77 %-97 %] 92 % (01/28 2143)  General - Well nourished, well developed, obtunded, not responding to voice, not open eyes.  Ophthalmologic - Fundi not visualized due to noncooperation.  Cardiovascular - Regular rate and rhythm.  Neuro - obtunded, not open eyes on voice, not following commands. Right frontal lacerations. PERRL, corneal positive, no doll's eyes, facial symmetrical, tongue at midline. On pain stimulation, slight  withdraw in all extremities, RUE > LUE, equal BLEs. Sensation, coordination or gait not tested.   ASSESSMENT/PLAN Scott [name] is a 75 y.o. male with history of CVA, mental retardation, schizophrenia  presenting with confusion and left-sided weakness following fall where he hit his head and obtained a large laceration. He did not receive IV t-PA as family refused.   AMS  significant change overnight  Obtunded with gurgling sound in airway  Suspicious for aspiration   Also severe gaseous distention of the colon  Repeat CT confirmed right MCA infarct but no new findings  Poor prognosis, discussed with family and Dr. Lucrezia Starch   Agree with palliative care consult  Confusion and left sided weakness following a fall with head laceration  MRI - right MCA territory infarcts. Small left parietal infarct. Remote left MCA infarcts.  CTA head and neck - acute right frontal lobe infarct. 90% or greater stenosis of the left ICA.  Carotid Doppler - >80% left internal carotid artery stenosis.  2D Echo -  EF 55-60%. 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    Mild Hypokalemia  Hospital day # 2   The patient has dramatic neuro changes overnight, currently obtunded, stat CT and CTA head and neck done confirmed right frontal infarct and left ICA > 90% stenosis. AMS also concerning for aspiration and for severely dilated colon. Discussed with family and Dr. Jerral Ralph, family  is leaning towards palliative care. I spent more than 30 minutes of face to face  time with the patient. Greater than 50% of time was spent in counseling and coordination of care.    Marvel Plan, MD PhD Stroke Neurology 06/20/2015 1:09 AM      To contact Stroke Continuity provider, please refer to [email or web address]. After hours, contact General Neurology

## 2015-06-20 DIAGNOSIS — I6529 Occlusion and stenosis of unspecified carotid artery: Secondary | ICD-10-CM | POA: Insufficient documentation

## 2015-06-20 DIAGNOSIS — R140 Abdominal distension (gaseous): Secondary | ICD-10-CM | POA: Insufficient documentation

## 2015-06-20 DIAGNOSIS — R0602 Shortness of breath: Secondary | ICD-10-CM | POA: Insufficient documentation

## 2015-06-20 DIAGNOSIS — Z515 Encounter for palliative care: Secondary | ICD-10-CM | POA: Insufficient documentation

## 2015-06-20 LAB — BASIC METABOLIC PANEL
ANION GAP: 7 (ref 5–15)
BUN: 23 mg/dL — ABNORMAL HIGH (ref 6–20)
CHLORIDE: 110 mmol/L (ref 101–111)
CO2: 25 mmol/L (ref 22–32)
CREATININE: 0.58 mg/dL — AB (ref 0.61–1.24)
Calcium: 8.5 mg/dL — ABNORMAL LOW (ref 8.9–10.3)
GFR calc non Af Amer: >60 mL/min (ref >60)
Glucose, Bld: 127 mg/dL — ABNORMAL HIGH (ref 65–99)
POTASSIUM: 3.5 mmol/L (ref 3.5–5.1)
SODIUM: 142 mmol/L (ref 135–145)

## 2015-06-20 LAB — CBC
HEMATOCRIT: 35.8 % — AB (ref 39.0–52.0)
Hemoglobin: 11.9 g/dL — ABNORMAL LOW (ref 13.0–17.0)
MCH: 30.6 pg (ref 26.0–34.0)
MCHC: 33.2 g/dL (ref 30.0–36.0)
MCV: 92 fL (ref 78.0–100.0)
Platelets: 274 10*3/uL (ref 150–400)
RBC: 3.89 MIL/uL — AB (ref 4.22–5.81)
RDW: 12.8 % (ref 11.5–15.5)
WBC: 10.4 10*3/uL (ref 4.0–10.5)

## 2015-06-20 LAB — GLUCOSE, CAPILLARY: Glucose-Capillary: 118 mg/dL — ABNORMAL HIGH (ref 65–99)

## 2015-06-20 MED ORDER — LORAZEPAM 2 MG/ML IJ SOLN: 1.0000 mg | INTRAMUSCULAR | Status: DC | PRN

## 2015-06-20 MED ORDER — ENOXAPARIN SODIUM 40 MG/0.4ML ~~LOC~~ SOLN
40.0000 mg | SUBCUTANEOUS | Status: DC
  Administered 2015-06-20 – 2015-06-22 (×3): 40 mg via SUBCUTANEOUS
  Filled 2015-06-20 (×4): qty 0.4

## 2015-06-20 NOTE — Consult Note (Signed)
Consultation Note Date: 06/20/2015   Patient Name: Scott [name]  DOB: [birth year]  MRN: [id number]  Age / Sex: 75 y.o., male  PCP: No primary care provider on file. Referring Physician: Maretta Bees, MD  Reason for Consultation: Establishing goals of care    Clinical Assessment/Narrative: Pt is a 75 yo man admitted from ALF with AMS. He was found to have an acute right territory MCA and left parasagittal parietal lobe ischemic infarct. Pt has underlying mental retardation and schizophrenia. He has been living in ALF x 1 year. Per sister, Mrs. Randa Lynn , pt was ambulatory at ALF, incontinent of urine, and required aid for dressing and bathing. He was able to feed himself. At baseline he is minimally verbal but can follow instructions and answer questions after you get his attention. Pt had an acute change yesterday where he was becoming congested, pooling of secretions, and decreased LOC. Today he is alert, verbal. He only parrots back words that you tell him to say. He has psychomotor restlessness and is repeatedly trying to pull out lines, tubes and climb out of bed. He has mittens on and a siotter. He is now NPO. When he was fed ice chips he became congested again  Contacts/Participants in Discussion: Primary Decision Maker: Mrs Randa Lynn Relationship to Patient sister HCPOA: yes  [phone]  SUMMARY OF RECOMMENDATIONS Family would like to treat the treatable and obtain a swallow evaluation to confirm degree of dysphagia and recommendations No PEG or artifical feeding May eat for comfort. Family understands the risks of aspiration At time of medical stabilization/DC depending upon condition, return to ALF with hospice coming in vs. In-pt hospice Continue comfort meds  Code Status/Advance Care Planning: DNR    Code Status Orders        Start     Ordered   06/19/15 0852  Do not attempt resuscitation (DNR)    Continuous    Question Answer Comment  In the event of cardiac or respiratory ARREST Do not call a "code blue"   In the event of cardiac or respiratory ARREST Do not perform Intubation, CPR, defibrillation or ACLS   In the event of cardiac or respiratory ARREST Use medication by any route, position, wound care, and other measures to relive pain and suffering. May use oxygen, suction and manual treatment of airway obstruction as needed for comfort.      06/19/15 0853    Code Status History    Date Active Date Inactive Code Status Order ID Comments User Context   06/17/2015 11:14 PM 06/19/2015  8:53 AM Full Code [id number]  Lorretta Harp, MD ED      Other Directives:None  Symptom Management:   Pain: Agree with low dose MS04 prn  Secretions: Improving. Continue prn robinul and atropine. If secretions become a consist ant problem may consider scop TD 1.5 q 72 hours  Palliative Prophylaxis:   Delirium Protocol, Frequent Pain Assessment and Turn Reposition  Additional Recommendations (Limitations, Scope, Preferences):  Full Comfort Care  Would like speech and language evaluation  Psycho-social/Spiritual:  Support System: Adequate Desire for further Chaplaincy support:no  Prognosis: Weeks. Pt very high risk for asp pna  Discharge Planning: Return to ALF with hospice support if he still meets criteria or in-pt hopsice   Chief Complaint/ Primary Diagnoses: Present on Admission:  . Fall . Stroke (cerebrum) (HCC) . Depression . Schizophrenia, acute (HCC) . Hypothyroid . Stroke [facility])  I have reviewed the medical record, interviewed the patient and family, and examined  the patient. The following aspects are pertinent.  Past Medical History  Diagnosis Date  . Hypothyroid   . Mental retardation   . Schizophrenia, acute (HCC)   . Depression   . Stroke (cerebrum) [facility])    Social History   Social History  . Marital Status: Single    Spouse Name: N/A  . Number of Children: N/A    . Years of Education: N/A   Social History Main Topics  . Smoking status: Unknown If Ever Smoked  . Smokeless tobacco: None  . Alcohol Use: None  . Drug Use: None  . Sexual Activity: Not Asked   Other Topics Concern  . None   Social History Narrative  . None   History reviewed. No pertinent family history. Scheduled Meds: .  stroke: mapping our early stages of recovery book   Does not apply Once  . ampicillin-sulbactam (UNASYN) IV  3 g Intravenous Q6H  . aspirin  300 mg Rectal Daily   Or  . aspirin  325 mg Oral Daily  . atropine  2 drop Sublingual QID  . glycopyrrolate  0.2 mg Intravenous TID  . levothyroxine  37.5 mcg Intravenous QAC breakfast  . omega-3 acid ethyl esters  1 g Oral Daily   Continuous Infusions:  PRN Meds:.LORazepam, morphine injection, senna-docusate, white petrolatum Medications Prior to Admission:  Prior to Admission medications   Medication Sig Start Date End Date Taking? Authorizing Provider  aspirin 325 MG tablet Take 325 mg by mouth daily.   Yes Historical Provider, MD  benztropine (COGENTIN) 0.5 MG tablet Take 0.5 mg by mouth 2 (two) times daily.   Yes Historical Provider, MD  gabapentin (NEURONTIN) 300 MG capsule Take 600 mg by mouth 3 (three) times daily.   Yes Historical Provider, MD  levothyroxine (SYNTHROID, LEVOTHROID) 75 MCG tablet Take 75 mcg by mouth daily before breakfast.   Yes Historical Provider, MD  Omega-3 Fatty Acids (FISH OIL) 1000 MG CAPS Take 1 capsule by mouth 2 (two) times daily.   Yes Historical Provider, MD  PARoxetine (PAXIL) 20 MG tablet Take 20 mg by mouth daily.   Yes Historical Provider, MD  risperiDONE (RISPERDAL) 1 MG tablet Take 1 mg by mouth every morning.   Yes Historical Provider, MD  risperiDONE (RISPERDAL) 2 MG tablet Take 2 mg by mouth every evening.   Yes Historical Provider, MD  traMADol (ULTRAM) 50 MG tablet Take 50 mg by mouth every 6 (six) hours as needed for moderate pain.   Yes Historical Provider, MD    No Known Allergies  Review of Systems  Unable to perform ROS: Psychiatric disorder    Physical Exam  Constitutional:  Cachetic acutely ill man  HENT:  Facial bruising and sutures  Respiratory: Effort normal.  Rhonchi; some upper airway secretions  Genitourinary:  Rectal tube   Neurological:  Psychomotor restlessness  Skin: Skin is warm and dry.    Vital Signs: BP 156/92 mmHg  Pulse 110  Temp(Src) 98.4 F (36.9 C) (Oral)  Resp 20  Ht  (1.778 m)  Wt 77.111 kg (170 lb)  BMI 24.39 kg/m2  SpO2 90%  SpO2: SpO2: 90 % O2 Device:SpO2: 90 % O2 Flow Rate: .O2 Flow Rate (L/min): 6 L/min  IO: Intake/output summary: No intake or output data in the 24 hours ending 06/20/15 1337  LBM: Last BM Date: 06/20/15 Baseline Weight: Weight: 77.111 kg (170 lb) Most recent weight: Weight: 77.111 kg (170 lb)      Palliative Assessment/Data:  Flowsheet  Rows        Most Recent Value   Intake Tab    Referral Department  Hospitalist   Unit at Time of Referral  Med/Surg Unit   Palliative Care Primary Diagnosis  Neurology   Date Notified  06/19/15   Palliative Care Type  New Palliative care   Reason for referral  Clarify Goals of Care, Counsel Regarding Hospice   Date of Admission  06/17/15   Date first seen by Palliative Care  06/20/15   # of days Palliative referral response time  1 Day(s)   # of days IP prior to Palliative referral  2   Clinical Assessment    Palliative Performance Scale Score  30%   Pain Max last 24 hours  Not able to report   Pain Min Last 24 hours  Not able to report   Dyspnea Max Last 24 Hours  Not able to report   Dyspnea Min Last 24 hours  Not able to report   Nausea Max Last 24 Hours  Not able to report   Nausea Min Last 24 Hours  Not able to report   Anxiety Max Last 24 Hours  Not able to report   Anxiety Min Last 24 Hours  Not able to report   Other Max Last 24 Hours  Not able to report   Psychosocial & Spiritual Assessment    Palliative Care  Outcomes    Patient/Family meeting held?  Yes   Who was at the meeting?  spoke with sister, Mrs. Randa Lynn [phone]   Palliative Care Outcomes  Clarified goals of care, Counseled regarding hospice, Changed to focus on comfort   Patient/Family wishes: Interventions discontinued/not started   Mechanical Ventilation, Tube feedings/TPN, NIPPV, BiPAP, Hemodialysis, PEG, Transfusion, Trach, Vasopressors   Palliative Care follow-up planned  Yes, Facility      Additional Data Reviewed:  CBC:    Component Value Date/Time   WBC 10.4 06/20/2015 1000   HGB 11.9* 06/20/2015 1000   HCT 35.8* 06/20/2015 1000   PLT 274 06/20/2015 1000   MCV 92.0 06/20/2015 1000   NEUTROABS 7.1 06/17/2015 1932   LYMPHSABS 1.2 06/17/2015 1932   MONOABS 0.9 06/17/2015 1932   EOSABS 0.1 06/17/2015 1932   BASOSABS 0.0 06/17/2015 1932   Comprehensive Metabolic Panel:    Component Value Date/Time   NA 142 06/20/2015 1000   K 3.5 06/20/2015 1000   CL 110 06/20/2015 1000   CO2 25 06/20/2015 1000   BUN 23* 06/20/2015 1000   CREATININE 0.58* 06/20/2015 1000   GLUCOSE 127* 06/20/2015 1000   CALCIUM 8.5* 06/20/2015 1000   AST 20 06/17/2015 1932   ALT 12* 06/17/2015 1932   ALKPHOS 217* 06/17/2015 1932   BILITOT 0.8 06/17/2015 1932   PROT 6.0* 06/17/2015 1932   ALBUMIN 3.3* 06/17/2015 1932     Time In: 1230 Time Out: 1340 Time Total: 70 min Greater than 50%  of this time was spent counseling and coordinating care related to the above assessment and plan.  Signed by: Irean Hong, NP  Irean Hong, NP  06/20/2015, 1:37 PM  Please contact Palliative Medicine Team phone at [phone] for questions and concerns.

## 2015-06-20 NOTE — NC FL2 (Addendum)
[place] MEDICAID [id number] LEVEL OF CARE SCREENING TOOL     IDENTIFICATION  Patient Name: Scott [name] Birthdate: 1940/06/10 Sex: male Admission Date (Current Location): 06/17/2015  [facility] and [place] Number:  Producer, television/film/video and Address:  [facility], [address], [place], [place] [address]      Provider Number: [id number]  Attending Physician Name and Address:  Maretta Bees, MD  Relative Name and Phone Number:       Current Level of Care: Hospital Recommended Level of Care: Skilled Nursing Facility Prior Approval Number:    Date Approved/Denied:   PASRR Number:    Discharge Plan: SNF    Current Diagnoses: Patient Active Problem List   Diagnosis Date Noted  . Abdominal distension   . Gaseous abdominal distention   . Palliative care encounter   . Cerebrovascular accident (CVA) due to occlusion of right middle cerebral artery (HCC)   . Fall 06/17/2015  . Stroke (cerebrum) (HCC) 06/17/2015  . Stroke (HCC) 06/17/2015  . Hypothyroid   . Mental retardation   . Schizophrenia, acute (HCC)   . Depression   . Forehead laceration   . Slurred speech     Orientation RESPIRATION BLADDER Height & Weight     (Disorientedx 4)  O2 (6Liters) Incontinent  (177.8 cm) 170 lbs.  BEHAVIORAL SYMPTOMS/MOOD NEUROLOGICAL BOWEL NUTRITION STATUS      Incontinent Diet (NPO)  AMBULATORY STATUS COMMUNICATION OF NEEDS Skin   Extensive Assist Does not communicate [facility]) Normal ([place])                       Personal Care Assistance Level of Assistance  Bathing, Feeding, Dressing Bathing Assistance: Maximum assistance Feeding assistance: Limited assistance Dressing Assistance: Maximum assistance     Functional Limitations Info  Sight, Hearing, Speech Sight Info: Adequate Hearing Info: Adequate Speech Info: Impaired ([place])    SPECIAL CARE FACTORS FREQUENCY  PT (By licensed PT), OT (By licensed OT)                     Contractures      Additional Factors Info  Code Status, Allergies Code Status Info: DNR Allergies Info: NO KNOWN ALLERGIES            Current Medications (06/20/2015):  This is the current hospital active medication list Current Facility-Administered Medications  Medication Dose Route Frequency Provider Last Rate Last Dose  .  stroke: mapping our early stages of recovery book   Does not apply Once Lorretta Harp, MD      . Ampicillin-Sulbactam (UNASYN) 3 g in sodium chloride 0.9 % 100 mL IVPB  3 g Intravenous Q6H Annamary Carolin, RPH   3 g at 06/20/15 1102  . aspirin suppository 300 mg  300 mg Rectal Daily Lorretta Harp, MD   300 mg at 06/19/15 1103   Or  . aspirin tablet 325 mg  325 mg Oral Daily Lorretta Harp, MD      . atropine 1 % ophthalmic solution 2 drop  2 drop Sublingual QID Maretta Bees, MD   2 drop at 06/20/15 1331  . glycopyrrolate (ROBINUL) injection 0.2 mg  0.2 mg Intravenous TID Maretta Bees, MD   0.2 mg at 06/20/15 1101  . levothyroxine (SYNTHROID, LEVOTHROID) injection 37.5 mcg  37.5 mcg Intravenous QAC breakfast Maretta Bees, MD   37.5 mcg at 06/20/15 0827  . LORazepam (ATIVAN) injection 1 mg  1  mg Intravenous Q4H PRN Maretta Bees, MD      . morphine 2 MG/ML injection 1 mg  1 mg Intravenous Q4H PRN Maretta Bees, MD   1 mg at 06/19/15 1838  . omega-3 acid ethyl esters (LOVAZA) capsule 1 g  1 g Oral Daily Lorretta Harp, MD   1 g at 06/18/15 1231  . senna-docusate (Senokot-S) tablet 1 tablet  1 tablet Oral QHS PRN Lorretta Harp, MD      . white petrolatum (VASELINE) gel   Topical PRN Shanker Levora Dredge, MD         Discharge Medications: Please see discharge summary for a list of discharge medications.  Relevant Imaging Results:  Relevant Lab Results:   Additional Information  SSN [id number]  Loleta Dicker, LCSW

## 2015-06-20 NOTE — Progress Notes (Addendum)
Speech Language Pathology Treatment: Dysphagia  Patient Details Name: Scott [name] MRN: [id number] DOB: [birth year] Today's Date: 06/20/2015 Time: 1353-1410 SLP Time Calculation (min) (ACUTE ONLY): 17 min  Assessment / Plan / Recommendation Clinical Impression  Pt has immediate coughing on ~50% of thin liquid trials, even with tsp-sized amounts. Coughing is not observed with nectar thick liquids or purees, although he does need Mod-Max cues and liquid washes to facilitate oral clearance due to oral holding and prolonged oral preparation. Risk of aspiration remains present due to mentation, but per palliative care note the family would prefer to accept the risks of aspiration and allow POs for comfort. Given the above, will initiate a Dys 1 diet and thin liquids to facilitate safety as much as possible.   HPI HPI: 75 y.o. male with PMH of hypothyroidism, depression, intellectual disability, schizophrenia, who presents with fall, altered mental status and slurred speech. CCT demonstrates contusion/laceration over right side of frontal bone; atrophy/chronic microvascular ischemic change and remote left frontal infarct.      SLP Plan  Continue with current plan of care     Recommendations  Diet recommendations: Dysphagia 1 (puree);Nectar-thick liquid Liquids provided via: Cup;Straw Medication Administration: Crushed with puree Supervision: Patient able to self feed;Staff to assist with self feeding;Full supervision/cueing for compensatory strategies Compensations: Minimize environmental distractions;Slow rate;Small sips/bites;Follow solids with liquid Postural Changes and/or Swallow Maneuvers: Seated upright 90 degrees             Oral Care Recommendations: Oral care BID Follow up Recommendations: Skilled Nursing facility Plan: Continue with current plan of care     GO                Maxcine Ham 06/20/2015, 2:15 PM

## 2015-06-20 NOTE — Progress Notes (Signed)
STROKE TEAM PROGRESS NOTE   SUBJECTIVE (INTERVAL HISTORY) No family members present today. The patient is much more alert and interactive. He follows some commands and responds to some questions. Breathing much better.  OBJECTIVE Temp:  [97.9 F (36.6 C)-99.7 F (37.6 C)] 98.4 F (36.9 C) (01/29 1259) Pulse Rate:  [74-110] 110 (01/29 1259) Cardiac Rhythm:  [-] Sinus tachycardia (01/29 0830) Resp:  [19-22] 20 (01/29 1259) BP: (125-166)/(77-99) 156/92 mmHg (01/29 1259) SpO2:  [87 %-94 %] 90 % (01/29 1259)  CBC:   Recent Labs Lab 06/17/15 1932  06/19/15 0610 06/20/15 1000  WBC 9.3  --  14.2* 10.4  NEUTROABS 7.1  --   --   --   HGB 12.0*  < > 13.6 11.9*  HCT 36.5*  < > 39.9 35.8*  MCV 91.9  --  92.4 92.0  PLT 309  --  306 274  < > = values in this interval not displayed.  Basic Metabolic Panel:   Recent Labs Lab 06/19/15 0610 06/20/15 1000  NA 139 142  K 3.3* 3.5  CL 107 110  CO2 25 25  GLUCOSE 124* 127*  BUN 8 23*  CREATININE 0.68 0.58*  CALCIUM 8.5* 8.5*    Lipid Panel:     Component Value Date/Time   CHOL 113 06/18/2015 0223   TRIG 33 06/18/2015 0223   HDL 40* 06/18/2015 0223   CHOLHDL 2.8 06/18/2015 0223   VLDL 7 06/18/2015 0223   LDLCALC 66 06/18/2015 0223   HgbA1c:  Lab Results  Component Value Date   HGBA1C 6.1* 06/18/2015   Urine Drug Screen:     Component Value Date/Time   LABOPIA NONE DETECTED 06/17/2015 2050   COCAINSCRNUR NONE DETECTED 06/17/2015 2050   LABBENZ NONE DETECTED 06/17/2015 2050   AMPHETMU NONE DETECTED 06/17/2015 2050   THCU NONE DETECTED 06/17/2015 2050   LABBARB NONE DETECTED 06/17/2015 2050      IMAGING I have personally reviewed the radiological images below and agree with the radiology interpretations.  Ct Head Wo Contrast 06/17/2015    Contusion and laceration over the right side of the frontal bone. Negative for fracture or acute intracranial abnormality. Atrophy, chronic microvascular ischemic change and remote  left frontal infarct.   Ct Cervical Spine Wo Contrast 06/18/2015    No apparent fracture or spondylolisthesis. Multifocal osteoarthritic change. Mild to moderate spinal stenosis at C3-4 and C4-5 due to bony hypertrophy. Bilateral carotid artery calcification.   Ct Abdomen Pelvis W Contrast 06/18/2015    Moderate to severe distention of the sigmoid colon and rectum with the sigmoid colon measuring up to 15 cm in greatest transverse dimension. Mild to moderate distention of the remainder of the colon containing stool and gas. No identifiable obstructing process. No evidence of pneumoperitoneum or small bowel distention. Acute to subacute appearing fracture of the posterior right seventh rib. Tiny right pleural effusion. Fractures involving the sacrum bilaterally, medial pubic bones bilaterally, left inferior pubic ramus and right L5 transverse process - age indeterminate but appears subacute. Correlate with pain in history. Cholelithiasis without CT evidence of acute cholecystitis.   Dg Chest Portable 1 View 06/17/2015    No acute cardiopulmonary disease. Gaseous distention of visualized bowel loops is nonspecific but may be be due to ileus.   MRI of the brain with and without contrast 06/18/2015 1. Patchy moderate sized multi focal right MCA territory ischemic infarcts as above. Additional small cortical ischemic infarct within the parasagittal left parietal lobe. 2. Remote left MCA territory  infarct with additional remote lacunar infarcts involving the bilateral basal ganglia and cerebellar hemispheres. 3. Abnormal flow void within the left ICA and to a lesser extent left MCA, which may reflect slow flow and/or partial occlusion or occlusion. This is suspected to likely be chronic in nature given the remote left MCA territory infarct. 4. Moderate atrophy with chronic small vessel ischemic disease. 5. Right frontal scalp contusion.  CT angiogram of the head and neck 06/19/2015 Low-density has now  developed in the right frontal lobe in the region of acute infarction. No evidence of hemorrhage or mass effect. No right carotid bifurcation stenosis. Severe stenosis of the distal bulb on the left, 90% or greater, with diminished flow beyond that. No intracranial large or medium vessel occlusion or visible missing branches.  TTE - - Left ventricle: The cavity size was normal. Wall thickness was normal. Systolic function was normal. The estimated ejection fraction was in the range of 55% to 60%. Although no diagnostic regional wall motion abnormality was identified, this possibility cannot be completely excluded on the basis of this study. Doppler parameters are consistent with abnormal left ventricular relaxation (grade 1 diastolic dysfunction). - Atrial septum: There was an atrial septal aneurysm.  CUS - Findings suggest 1-39% right internal carotid artery stenosis and >80% left internal carotid artery stenosis. Vertebral arteries are patent with antegrade flow.   PHYSICAL EXAM  Temp:  [97.9 F (36.6 C)-99.7 F (37.6 C)] 98.4 F (36.9 C) (01/29 1259) Pulse Rate:  [74-110] 110 (01/29 1259) Resp:  [19-22] 20 (01/29 1259) BP: (125-166)/(77-99) 156/92 mmHg (01/29 1259) SpO2:  [87 %-94 %] 90 % (01/29 1259)  General - Well nourished, well developed, limited speech, consistent with MR.  Ophthalmologic - Fundi not visualized due to noncooperation.  Cardiovascular - Regular rate and rhythm.  Neuro -  Awake, alert, more interactive, able to answer very simple questions and very limited verbal output. Not able to answer orientation questions due to MR. Right frontal laceration with stitches. Blinking to visual threat bilaterally, PERRL, not cooperative on eye exam, facial symmetric, tongue midline, moving all extremities equally, DTR not cooperative, sensation coordination and gait not tested.   ASSESSMENT/PLAN [name] is a 75 y.o. male with history of CVA, mental  retardation, schizophrenia  presenting with confusion and left-sided weakness following fall where he hit his head and obtained a large laceration. He did not receive IV t-PA as family refused.   Stroke: multifocal small infarcts, embolic pattern. However, given pt baseline MR, psych disorder, fall risk, not good candidate for [facility], will hold off further embolic work up. Also not candidate for carotid intervention.  Resultant - back to baseline  MRI - right MCA territory infarcts. Small left parietal infarct. Remote left MCA infarcts.  CTA head and neck - acute right frontal lobe infarct. 90% or greater stenosis of the left ICA.  Carotid Doppler - >80% left internal carotid artery stenosis.  2D Echo -  EF 55-60%. 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 Continue ASA on discharge.  Hold off embolic work up due to not candidate for [facility]  Disposition: pending  AMS, resolved  significant improvement overnight  Obtundation resolved   Suspicious for aspiration and severe gaseous distention of the colon  Repeat CT confirmed right  MCA infarct but no new findings  Hypertension  Stable  Permissive hypertension  Other Stroke Risk Factors  Advanced age  Hx stroke/TIA 2 years ago per family where they refused TPA  Other Active Problems  Hypothyroid  Mental retardation  Schizophrenia  Leukocytosis - day #2 IV Unasyn - leukocytosis now resolved.  Mild Hypokalemia - resolved  Mild anemia  Hospital day # 3   Neurology will sign off. Please call with questions. No neuro follow up needed at this time. Thanks for the consult.  Marvel Plan, MD PhD Stroke Neurology 06/20/2015 6:14 PM    To contact Stroke Continuity provider, please refer to [email or web address]. After hours, contact  General Neurology

## 2015-06-20 NOTE — Progress Notes (Signed)
CSW followed up with [facility] to confirm that patient is from their facility. Per facility representative, patient is a resident and has been for a few months. Facility representative advised CSW to contact Andrey Farmer on tomorrow at [phone] regarding the patient's status. CSW will follow up with facility on tomorrow.   CSW attempted to get in contact with patient's sister Mrs. Lamb at [phone] but received no answer. CSW will follow up at a later time. CSW will continue to follow and provide support to patient while in hospital.   Fernande Boyden, [facility] Clinical Social Worker [facility] Ph: [phone]

## 2015-06-20 NOTE — Progress Notes (Signed)
Unable to complete pt admission due to mental status of pt, family not at bedside.

## 2015-06-20 NOTE — Progress Notes (Signed)
PATIENT DETAILS Name: Scott [name] Age: 75 y.o. Sex: male Date of Birth: [birth year] Admit Date: 06/17/2015 Admitting Physician Lorretta Harp, MD PCP:No primary care provider on file.  Subjective: Much more awake and alert. Better control of oral secretions today (on Robinul and Atropine gtt). Still on 6 L of oxygen.Still coughing!  Assessment/Plan: Principal Problem: Acute hypoxic respiratory failure: In the setting of CVA, dysphagia, and now aspiration pneumonia.Better control of oral secretions with IV Robinul and Atropine. Continue O2 support. Await SLP eval to see if he can be started on diet-overall prognoses continues to be very poor. DNR in place.  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 Carotid Doppler showed >80% left internal carotid artery stenosis.  Echocardiogram showed preserved ejection fraction without any embolic source.LDL 66, A1C 6.1. Doubt given overall clinical scenario patient a candidate for CEA. Await SLP eval today. Poor overall prognoses remains unchanged in spite of improvement of mental status.Continue ASA suppository.  Dysphagia: Secondary to above.Better control of oral secretions with IV Robinul and Atropine.Await SLP eval to see if he can be started on diet-overall prognoses continues to be very poor.Suspect may need to be transitioned to residential hospice care if NPO status is continues.   Presumed aspiration pneumonia: Continue Unasyn.See above  Distended rectum/sigmoid colon: Secondary to colonic dysmotility-per GI this is commonly seen in institutionalized psychiatry patient's. Do not feel that this is clinically relevant-especially given above noted issues. Has a flexiseal in place for comfort.  Hypothyroidism: Continue Synthroid  History of mental  retardation/schizophrenia: Hold oral medications given inability to take oral intake. Await SLP eval  Fractures involving the sacrum bilaterally, medial pubic bones bilaterally, left inferior pubic ramus and right L5 transverse process: Age indeterminate but most likely subacute-per radiology report.Suspect secondary to numerous recent falls. Supportive care only at this time.   Laceration right forehead: Sutured in the emergency room,tDap x 1.  Palliative care: Unfortunate patient with hx of mental retardation/schizophrenia-now with mod-large CVA with resultant dysphagia and aspiration PNA. Although improved today-with better control of secretions with IV Robinul and atropine-still appears to be coughing a lot-suspect ongoing aspiration. Await SLP eval today. However poor overall prognoses remains, suspect if on SLP eval has significant aspirations-best served by initiation of comfort feeds and transfer to residential hospice. Will d/w family when they arrive.   Disposition: Remain inpatient  Antimicrobial agents  See below  Anti-infectives    Start     Dose/Rate Route Frequency Ordered Stop   06/19/15 1100  Ampicillin-Sulbactam (UNASYN) 3 g in sodium chloride 0.9 % 100 mL IVPB     3 g 100 mL/hr over 60 Minutes Intravenous Every 6 hours 06/19/15 1054        DVT Prophylaxis: SCD's  Code Status:  DNR  Family Communication None at bedside today  CONSULTS:  neurology  Time spent 30 minutes-Greater than 50% of this time was spent in counseling, explanation of diagnosis, planning of further management, and coordination of care.  MEDICATIONS: Scheduled Meds: .  stroke: mapping our early stages of recovery book   Does not apply Once  . ampicillin-sulbactam (UNASYN) IV  3 g Intravenous Q6H  . aspirin  300 mg Rectal Daily   Or  . aspirin  325 mg Oral Daily  .  atropine  2 drop Sublingual QID  . glycopyrrolate  0.2 mg Intravenous TID  . levothyroxine  37.5 mcg Intravenous QAC  breakfast  . omega-3 acid ethyl esters  1 g Oral Daily   Continuous Infusions:  PRN Meds:.LORazepam, morphine injection, senna-docusate, traMADol, white petrolatum    PHYSICAL EXAM: Vital signs in last 24 hours: Filed Vitals:   06/19/15 1755 06/19/15 2143 06/20/15 0059 06/20/15 0602  BP: 125/77 131/99 166/89 150/94  Pulse: 102 100 95 85  Temp: 99.7 F (37.6 C) 97.9 F (36.6 C) 98.2 F (36.8 C) 98.9 F (37.2 C)  TempSrc: Axillary Oral Oral Oral  Resp: Height:      Weight:      SpO2: 87% 92% 94% 92%    Weight change:  Filed Weights   06/17/15 1935  Weight: 77.111 kg (170 lb)   Body mass index is 24.39 kg/(m^2).   Gen Exam: Awake/alert-minimal gurgling-+++ coughingery lethargic Neck: Supple, Chest:  transmitted upper airway sounds all over CVS: S1 S2 Regular Abdomen: soft, BS +, non tender, appears more  distended today .  Extremities: no edema Neurologic:  difficult exam-moves all 4 extremities to pain-but appears to have slight left-sided weakness   Intake/Output from previous day:  Intake/Output Summary (Last 24 hours) at 06/20/15 0908 Last data filed at 06/19/15 1314  Gross per 24 hour  Intake      0 ml  Output    500 ml  Net   -500 ml     LAB RESULTS: CBC  Recent Labs Lab 06/17/15 1932 06/17/15 1944 06/19/15 0610  WBC 9.3  --  14.2*  HGB 12.0* 12.9* 13.6  HCT 36.5* 38.0* 39.9  PLT 309  --  306  MCV 91.9  --  92.4  MCH 30.2  --  31.5  MCHC 32.9  --  34.1  RDW 12.7  --  12.7  LYMPHSABS 1.2  --   --   MONOABS 0.9  --   --   EOSABS 0.1  --   --   BASOSABS 0.0  --   --     Chemistries   Recent Labs Lab 06/17/15 1932 06/17/15 1944 06/19/15 0610  NA 142 142 139  K 3.9 3.7 3.3*  CL 106 105 107  CO2 25  --  25  GLUCOSE 101* 95 124*  BUN CREATININE 0.76 0.60* 0.68  CALCIUM 8.6*  --  8.5*    CBG:  Recent Labs Lab 06/17/15 1951 06/18/15 0818 06/19/15 0639 06/20/15 0635  GLUCAP 105* 95 116* 118*     GFR Estimated Creatinine Clearance: 82.4 mL/min (by C-G formula based on Cr of 0.68).  Coagulation profile  Recent Labs Lab 06/17/15 1932  INR 1.26    Cardiac Enzymes No results for input(s): CKMB, TROPONINI, MYOGLOBIN in the last 168 hours.  Invalid input(s): CK  Invalid input(s): POCBNP No results for input(s): DDIMER in the last 72 hours.  Recent Labs  06/18/15 0223  HGBA1C 6.1*    Recent Labs  06/18/15 0223  CHOL 113  HDL 40*  LDLCALC 66  TRIG 33  CHOLHDL 2.8    Recent Labs  06/18/15 0223  TSH 1.062   No results for input(s): VITAMINB12, FOLATE, FERRITIN, TIBC, IRON, RETICCTPCT in the last 72 hours. No results for input(s): LIPASE, AMYLASE in the last 72 hours.  Urine Studies No results for input(s): UHGB, CRYS in the last 72 hours.  Invalid input(s): UACOL, UAPR, USPG,  UPH, UTP, UGL, UKET, UBIL, UNIT, UROB, ULEU, UEPI, UWBC, URBC, UBAC, CAST, UCOM, BILUA  MICROBIOLOGY: No results found for this or any previous visit (from the past 240 hour(s)).  RADIOLOGY STUDIES/RESULTS: Ct Angio Head W/cm &/or Wo Cm  06/19/2015  CLINICAL DATA:  Chronic mental retardation.  Stroke. EXAM: CT ANGIOGRAPHY HEAD AND NECK TECHNIQUE: Multidetector CT imaging of the head and neck was performed using the standard protocol during bolus administration of intravenous contrast. Multiplanar CT image reconstructions and MIPs were obtained to evaluate the vascular anatomy. Carotid stenosis measurements (when applicable) are obtained utilizing NASCET criteria, using the distal internal carotid diameter as the denominator. CONTRAST:  50mL OMNIPAQUE IOHEXOL 350 MG/ML SOLN COMPARISON:  MRI 06/18/2015.  CT 06/17/2015. FINDINGS: CT HEAD Low-density in the right frontal lobe consistent with the acute infarction in that region. No mass effect or shift. Old left frontal cortical and subcortical infarction. No other focal insult visible. Widespread chronic small-vessel disease of the deep white  matter. No hemorrhage, hydrocephalus or extra-axial collection. No skull fracture. Small amount of fluid in the sphenoid sinus. CTA NECK Aortic arch: Atherosclerosis of the arch but no aneurysm or dissection. Branching pattern of the brachiocephalic vessels is normal without origin stenosis. Right carotid system: Common carotid artery widely patent to the bifurcation. Mild atherosclerotic disease at the bifurcation but no stenosis or irregularity. Cervical internal carotid artery widely patent. Atherosclerotic calcification proximal to the skullbase with stenosis no more than 20%. Left carotid system: Common carotid artery widely patent to the bifurcation. Atherosclerotic disease affecting the distal common carotid artery with stenosis of 30% in that location. Atherosclerotic disease at the carotid bifurcation with severe stenosis at the distal bulb. Patent luminal diameter through that location measures 1 mm or less. The more distal cervical ICA shows reduced caliber because of reduced inflow. This likely represents a greater than 90% stenosis. Vertebral arteries:Both vertebral artery origins are widely patent with the right being dominant. Both vertebral arteries are widely patent through the cervical region. Skeleton: Advanced chronic cervical spondylosis.  No acute finding. Other neck: No other soft tissue lesion in the neck. The esophagus does appear to be thick walled. Layering pleural effusion on the right. CTA HEAD Anterior circulation: Both internal carotid arteries are patent through the siphon regions. There is considerable motion degradation in this region. Atherosclerotic calcification probably results in siphon narrowing on the left are approximately 50%. Anterior and middle cerebral vessels are patent without proximal stenosis, aneurysm or vascular malformation. Posterior circulation: Both vertebral arteries are patent to the basilar. No basilar stenosis. Posterior circulation branch vessels are  normal. Venous sinuses: Patent and normal Anatomic variants: None significant Delayed phase: No abnormal enhancement IMPRESSION: Low-density has now developed in the right frontal lobe in the region of acute infarction. No evidence of hemorrhage or mass effect. No right carotid bifurcation stenosis. Severe stenosis of the distal bulb on the left, 90% or greater, with diminished flow beyond that. No intracranial large or medium vessel occlusion or visible missing branches. Electronically Signed   By: Paulina Fusi M.D.   On: 06/19/2015 10:50   Ct Head Wo Contrast  06/17/2015  CLINICAL DATA:  Status post fall today.  Initial encounter. EXAM: CT HEAD WITHOUT CONTRAST TECHNIQUE: Contiguous axial images were obtained from the base of the skull through the vertex without intravenous contrast. COMPARISON:  None. FINDINGS: Contusion and laceration over right frontal bone are identified. There is no underlying fracture. The brain is atrophic with chronic microvascular ischemic change. Remote  appearing left frontal infarct is noted. No acute intracranial abnormality including hemorrhage, infarct, mass lesion, mass effect, midline shift or abnormal extra-axial fluid collection. IMPRESSION: Contusion and laceration over the right side of the frontal bone. Negative for fracture or acute intracranial abnormality. Atrophy, chronic microvascular ischemic change and remote left frontal infarct. These results were called by telephone at the time of interpretation on 06/17/2015 at 8:30 pm to Dr. Cathren Laine , who verbally acknowledged these results. Electronically Signed   By: Drusilla Kanner M.D.   On: 06/17/2015 20:30   Ct Angio Neck W/cm &/or Wo/cm  06/19/2015  CLINICAL DATA:  Chronic mental retardation.  Stroke. EXAM: CT ANGIOGRAPHY HEAD AND NECK TECHNIQUE: Multidetector CT imaging of the head and neck was performed using the standard protocol during bolus administration of intravenous contrast. Multiplanar CT image  reconstructions and MIPs were obtained to evaluate the vascular anatomy. Carotid stenosis measurements (when applicable) are obtained utilizing NASCET criteria, using the distal internal carotid diameter as the denominator. CONTRAST:  50mL OMNIPAQUE IOHEXOL 350 MG/ML SOLN COMPARISON:  MRI 06/18/2015.  CT 06/17/2015. FINDINGS: CT HEAD Low-density in the right frontal lobe consistent with the acute infarction in that region. No mass effect or shift. Old left frontal cortical and subcortical infarction. No other focal insult visible. Widespread chronic small-vessel disease of the deep white matter. No hemorrhage, hydrocephalus or extra-axial collection. No skull fracture. Small amount of fluid in the sphenoid sinus. CTA NECK Aortic arch: Atherosclerosis of the arch but no aneurysm or dissection. Branching pattern of the brachiocephalic vessels is normal without origin stenosis. Right carotid system: Common carotid artery widely patent to the bifurcation. Mild atherosclerotic disease at the bifurcation but no stenosis or irregularity. Cervical internal carotid artery widely patent. Atherosclerotic calcification proximal to the skullbase with stenosis no more than 20%. Left carotid system: Common carotid artery widely patent to the bifurcation. Atherosclerotic disease affecting the distal common carotid artery with stenosis of 30% in that location. Atherosclerotic disease at the carotid bifurcation with severe stenosis at the distal bulb. Patent luminal diameter through that location measures 1 mm or less. The more distal cervical ICA shows reduced caliber because of reduced inflow. This likely represents a greater than 90% stenosis. Vertebral arteries:Both vertebral artery origins are widely patent with the right being dominant. Both vertebral arteries are widely patent through the cervical region. Skeleton: Advanced chronic cervical spondylosis.  No acute finding. Other neck: No other soft tissue lesion in the neck.  The esophagus does appear to be thick walled. Layering pleural effusion on the right. CTA HEAD Anterior circulation: Both internal carotid arteries are patent through the siphon regions. There is considerable motion degradation in this region. Atherosclerotic calcification probably results in siphon narrowing on the left are approximately 50%. Anterior and middle cerebral vessels are patent without proximal stenosis, aneurysm or vascular malformation. Posterior circulation: Both vertebral arteries are patent to the basilar. No basilar stenosis. Posterior circulation branch vessels are normal. Venous sinuses: Patent and normal Anatomic variants: None significant Delayed phase: No abnormal enhancement IMPRESSION: Low-density has now developed in the right frontal lobe in the region of acute infarction. No evidence of hemorrhage or mass effect. No right carotid bifurcation stenosis. Severe stenosis of the distal bulb on the left, 90% or greater, with diminished flow beyond that. No intracranial large or medium vessel occlusion or visible missing branches. Electronically Signed   By: Paulina Fusi M.D.   On: 06/19/2015 10:50   Ct Cervical Spine Wo Contrast  06/18/2015  CLINICAL DATA:  Pain following fall EXAM: CT CERVICAL SPINE WITHOUT CONTRAST TECHNIQUE: Multidetector CT imaging of the cervical spine was performed without intravenous contrast. Multiplanar CT image reconstructions were also generated. COMPARISON:  None. FINDINGS: There is no demonstrable fracture or spondylolisthesis. The prevertebral soft tissues and predental space regions are normal. There is moderately severe disc space narrowing at C3-4, C4-5, C6-7, and C7-T1. There is multilevel facet hypertrophy. There is exit foraminal narrowing due to bony hypertrophy at C3-4 bilaterally, C4-5 bilaterally, C5-6 bilaterally, and C6-7 bilaterally. There is mild amount moderate spinal stenosis at C3-4 and C4-5 due to bony hypertrophy. There are foci of carotid  artery calcification, more on the left than on the right. There is also atherosclerotic calcification in the aortic arch. IMPRESSION: No apparent fracture or spondylolisthesis. Multifocal osteoarthritic change. Mild to moderate spinal stenosis at C3-4 and C4-5 due to bony hypertrophy. Bilateral carotid artery calcification. Electronically Signed   By: Bretta Bang III M.D.   On: 06/18/2015 10:04   Mr Laqueta Jean ZO Contrast  06/18/2015  CLINICAL DATA:  Initial evaluation for fall, altered mental status, slurred speech. History of schizophrenia, depression. EXAM: MRI HEAD WITHOUT AND WITH CONTRAST TECHNIQUE: Multiplanar, multiecho pulse sequences of the brain and surrounding structures were obtained without and with intravenous contrast. CONTRAST:  15mL MULTIHANCE GADOBENATE DIMEGLUMINE 529 MG/ML IV SOLN COMPARISON:  Prior CT from 06/17/2015. FINDINGS: Diffuse prominence of the CSF containing spaces is compatible with generalized atrophy, moderate for age. Patchy and confluent T2/FLAIR hyperintensity within the periventricular and deep white matter most consistent with chronic small vessel ischemic changes, also moderate in nature. Small vessel type changes present within the pons. Focal encephalomalacia within the anterior left frontal lobe compatible with remote infarct. Additional small remote bilateral cerebellar infarcts noted. Scatter remote lacunar infarcts involve the bilateral basal ganglia. Confluent restricted diffusion involving the anterior right operculum the with extension into the right insular cortex and subinsular white matter consistent with acute right MCA territory infarct. Additional patchy infarct within knee right basal ganglia/corona radiata out. It is patchy cortical infarcts more posteriorly within the right parietal and occipital lobes. Additional small cortical infarct within the parasagittal left parietal lobe. There is gyral swelling and edema on T2/FLAIR sequences without  significant mass effect. Mild associated petechial hemorrhage within the right parietal lobe (Series 7, image 18). No other evidence for hemorrhage. Abnormal flow void within the left ICA and MCA, which may reflect slow flow and/or atheromatous disease. The left ICA itself may be occluded, although the left MCA is felt to be at least partially patent. Remainder of the intracranial vascular flow voids are patent, although atheromatous disease noted within the vertebrobasilar system. No mass lesion, midline shift, or mass effect. Ventricular prominence related to global parenchymal volume loss without hydrocephalus. No extra-axial fluid collection. No abnormal enhancement following contrast administration. Craniocervical junction within normal limits. Multilevel degenerative spondylolysis noted within the upper cervical spine. There is apparent moderate to severe canal stenosis at the C3-4 level. Pituitary gland within normal limits. No acute abnormality about the orbits. Mild scattered mucosal thickening within the ethmoidal air cells. Minimal layering opacity within the right sphenoid sinus. No significant mastoid effusion. Inner ear structures grossly normal. Bone marrow signal intensity within normal limits. Right frontal scalp contusion present. IMPRESSION: 1. Patchy moderate sized multi focal right MCA territory ischemic infarcts as above. Additional small cortical ischemic infarct within the parasagittal left parietal lobe. 2. Remote left MCA territory infarct with additional remote lacunar infarcts involving  the bilateral basal ganglia and cerebellar hemispheres. 3. Abnormal flow void within the left ICA and to a lesser extent left MCA, which may reflect slow flow and/or partial occlusion or occlusion. This is suspected to likely be chronic in nature given the remote left MCA territory infarct. 4. Moderate atrophy with chronic small vessel ischemic disease. 5. Right frontal scalp contusion. Electronically  Signed   By: Rise Mu M.D.   On: 06/18/2015 22:12   Ct Abdomen Pelvis W Contrast  06/18/2015  CLINICAL DATA:  75 year old male with fall, abdominal pain and distention. EXAM: CT ABDOMEN AND PELVIS WITH CONTRAST TECHNIQUE: Multidetector CT imaging of the abdomen and pelvis was performed using the standard protocol following bolus administration of intravenous contrast. CONTRAST:  75mL OMNIPAQUE IOHEXOL 300 MG/ML  SOLN COMPARISON:  None. FINDINGS: Lower chest: A tiny right pleural effusion is noted with small bibasilar atelectasis. Cardiomegaly and coronary artery calcifications are present. Hepatobiliary: Multiple cysts are identified. Cholelithiasis is identified without CT evidence of acute cholecystitis. There is no evidence of biliary dilatation. Pancreas: Unremarkable Spleen: Unremarkable Adrenals/Urinary Tract: Mild bilateral renal cortical thinning noted. There is no evidence of renal mass, urinary calculi or hydronephrosis. Stomach/Bowel: The sigmoid colon and rectum are distended with gas and fluid, with the sigmoid colon measuring up to 15 cm in greatest transverse dimension. Mild to moderate distention of the remainder of the colon with stool and gas noted. No definite obstructing lesion identified. There is no evidence of small bowel dilatation. Appendix is normal. Vascular/Lymphatic: Aortic atherosclerotic calcifications noted without aneurysm. No enlarged lymph nodes are identified. Reproductive: Prostate calcifications noted. Other: No free fluid, pneumoperitoneum or abscess. Musculoskeletal: An acute to subacute appearing fracture of the posterior right seventh rib is noted. Fractures involving the sacrum bilaterally, bilateral medial pubic bones, left inferior pubic ramus, and right L5 transverse process are age indeterminate but appear subacute. Heterotopic ossification adjacent to the left inferior pubic ramus fracture is noted. Remote appearing left rib fractures are present.  IMPRESSION: Moderate to severe distention of the sigmoid colon and rectum with the sigmoid colon measuring up to 15 cm in greatest transverse dimension. Mild to moderate distention of the remainder of the colon containing stool and gas. No identifiable obstructing process. No evidence of pneumoperitoneum or small bowel distention. Acute to subacute appearing fracture of the posterior right seventh rib. Tiny right pleural effusion. Fractures involving the sacrum bilaterally, medial pubic bones bilaterally, left inferior pubic ramus and right L5 transverse process - age indeterminate but appears subacute. Correlate with pain in history. Cholelithiasis without CT evidence of acute cholecystitis. Electronically Signed   By: Harmon Pier M.D.   On: 06/18/2015 10:29   Dg Chest Port 1 View  06/19/2015  CLINICAL DATA:  Shortness of breath EXAM: PORTABLE CHEST 1 VIEW COMPARISON:  06/17/2015 FINDINGS: Stable heart size with central vascular congestion and basilar atelectasis evident. No definite edema pattern or CHF. No focal pneumonia, collapse or consolidation. No effusion or pneumothorax. Trachea midline. Atherosclerosis of the aorta. Diffuse distention of the bowel in the epigastric region compatible with ileus. IMPRESSION: Increased vascular congestion and basilar atelectasis. Gaseous distention in the upper abdomen suspicious for ileus. Electronically Signed   By: Judie Petit.  Shick M.D.   On: 06/19/2015 09:00   Dg Chest Portable 1 View  06/17/2015  CLINICAL DATA:  Head trauma secondary to a fall today. Code stroke. Initial encounter. EXAM: PORTABLE CHEST 1 VIEW COMPARISON:  None. FINDINGS: The lungs are clear. Heart size is upper normal. No  pneumothorax or pleural effusion. No focal bony abnormality. Gaseous distention of visualized bowel loops in the upper abdomen noted. IMPRESSION: No acute cardiopulmonary disease. Gaseous distention of visualized bowel loops is nonspecific but may be be due to ileus. Electronically  Signed   By: Drusilla Kanner M.D.   On: 06/17/2015 20:10   Dg Abd 2 Views  06/19/2015  CLINICAL DATA:  Abdominal distention.  Unresponsive. EXAM: ABDOMEN - 2 VIEW COMPARISON:  CT 06/18/2015 FINDINGS: Severe gaseous distention of the colon. Large stool burden throughout the colon. Findings are similar to prior CT. Contrast material noted within the collecting systems of the kidneys bilaterally. No free air. IMPRESSION: Continued marked gaseous distention of the colon. Large stool burden. Electronically Signed   By: Charlett Nose M.D.   On: 06/19/2015 10:46    Jeoffrey Massed, MD  [facility] Pager:[phone]  If 7PM-7AM, please contact night-coverage [email or web address] Password [email or web address] 06/20/2015, 9:08 AM   LOS: 3 days

## 2015-06-21 ENCOUNTER — Inpatient Hospital Stay (HOSPITAL_COMMUNITY): Payer: Medicare Other

## 2015-06-21 DIAGNOSIS — I6522 Occlusion and stenosis of left carotid artery: Secondary | ICD-10-CM

## 2015-06-21 MED ORDER — GLYCOPYRROLATE 0.2 MG/ML IJ SOLN
0.2000 mg | Freq: Three times a day (TID) | INTRAMUSCULAR | Status: DC | PRN
  Filled 2015-06-21: qty 1

## 2015-06-21 MED ORDER — RISPERIDONE 0.5 MG PO TABS
2.0000 mg | ORAL_TABLET | Freq: Every evening | ORAL | Status: DC
  Administered 2015-06-21 – 2015-06-22 (×2): 2 mg via ORAL
  Filled 2015-06-21 (×2): qty 4

## 2015-06-21 MED ORDER — LEVOTHYROXINE SODIUM 75 MCG PO TABS
75.0000 ug | ORAL_TABLET | Freq: Every day | ORAL | Status: DC
  Administered 2015-06-22 – 2015-06-23 (×2): 75 ug via ORAL
  Filled 2015-06-21 (×2): qty 1

## 2015-06-21 MED ORDER — PAROXETINE HCL 20 MG PO TABS
20.0000 mg | ORAL_TABLET | Freq: Every day | ORAL | Status: DC
  Administered 2015-06-21 – 2015-06-23 (×3): 20 mg via ORAL
  Filled 2015-06-21 (×3): qty 1

## 2015-06-21 MED ORDER — HALOPERIDOL LACTATE 5 MG/ML IJ SOLN
2.0000 mg | Freq: Once | INTRAMUSCULAR | Status: AC
  Administered 2015-06-21: 2 mg via INTRAMUSCULAR

## 2015-06-21 MED ORDER — GABAPENTIN 300 MG PO CAPS
600.0000 mg | ORAL_CAPSULE | Freq: Three times a day (TID) | ORAL | Status: DC
  Administered 2015-06-21 – 2015-06-23 (×5): 600 mg via ORAL
  Filled 2015-06-21 (×6): qty 2

## 2015-06-21 MED ORDER — LORAZEPAM 2 MG/ML IJ SOLN
1.0000 mg | INTRAMUSCULAR | Status: DC | PRN
  Administered 2015-06-21: 2 mg via INTRAVENOUS
  Administered 2015-06-22: 1 mg via INTRAVENOUS
  Filled 2015-06-21 (×2): qty 1

## 2015-06-21 MED ORDER — RISPERIDONE 0.5 MG PO TABS
1.0000 mg | ORAL_TABLET | Freq: Every morning | ORAL | Status: DC
  Administered 2015-06-21 – 2015-06-23 (×3): 1 mg via ORAL
  Filled 2015-06-21 (×3): qty 2

## 2015-06-21 MED ORDER — BENZTROPINE MESYLATE 0.5 MG PO TABS
0.5000 mg | ORAL_TABLET | Freq: Two times a day (BID) | ORAL | Status: DC
  Administered 2015-06-21 – 2015-06-23 (×4): 0.5 mg via ORAL
  Filled 2015-06-21 (×5): qty 1

## 2015-06-21 MED ORDER — HALOPERIDOL LACTATE 5 MG/ML IJ SOLN
2.0000 mg | Freq: Four times a day (QID) | INTRAMUSCULAR | Status: DC | PRN
  Filled 2015-06-21: qty 1

## 2015-06-21 NOTE — Progress Notes (Signed)
CSW contacted Andrey Farmer at [facility]. Per [facility], patient is a resident at their facility. CSW informed [facility] of PT recommendation for SNF placement, and CSW would need patient's SS# to submit for [facility]. Number provided to CSW. [facility] has requested for CSW to send patient clinicals for review. Per [facility], if patient needs hospice or palliative care then those services can be provided at the facility. [facility] informed CSW that she should be able to take the patient back, however is requesting MD orders for hospice if recommended. The facility uses services from [facility]. CSW to fax clinicals to [phone]. [facility] is aware that patient is not medically stable and ready for discharge. Anticipated D/C date is tomorrow.   CSW will continue to follow and provide support while in the hospital.   Fernande Boyden, [facility] Clinical Social Worker [facility] Ph: [phone]

## 2015-06-21 NOTE — Progress Notes (Signed)
Physical Therapy Treatment Patient Details Name: Scott [name] MRN: [id number] DOB: 08-15-1940 Today's Date: 06/21/2015    History of Present Illness Patient is a 75 yo male admitted 06/17/15 with AMS, fall, slurred speech. 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 PMH:  Depression, mental retardation, schizophrenia, CVA    PT Comments    Pt ambulated 20' with min HHA +2, O2 sats 94% on 2L O2, 86% on RA with heavy breathing while ambulating. Pt generally unsteady with narrow BOS but did not note weakness greater on one side than the other. PT will continue to follow.   Follow Up Recommendations  SNF;Supervision/Assistance - 24 hour     Equipment Recommendations  None recommended by PT    Recommendations for Other Services       Precautions / Restrictions Precautions Precautions: Fall Precaution Comments: pt moves very quickly so watch lines carefully. Has rectal tube, IV, O2. Wearing mitts. Restrictions Weight Bearing Restrictions: No    Mobility  Bed Mobility Overal bed mobility: Needs Assistance Bed Mobility: Supine to Sit     Supine to sit: Min assist     General bed mobility comments: when cued to get up, pt rolled and sat up with min A. Pt very flexible and quickly kicks legs out as well as crossing them.   Transfers Overall transfer level: Needs assistance Equipment used: 2 person hand held assist Transfers: Sit to/from Stand Sit to Stand: Min assist;+2 physical assistance         General transfer comment: Min A +2 for safety and to steady, pt did not need boost to get up. Pt almost spastic with mvmt so +2 assist needed to control motion and move safely.   Ambulation/Gait Ambulation/Gait assistance: Min assist;+2 physical assistance Ambulation Distance (Feet): 20 Feet Assistive device: 2 person hand held assist Gait Pattern/deviations: Step-through pattern;Narrow base of support;Staggering left;Staggering right Gait velocity: decreased Gait velocity  interpretation: <1.8 ft/sec, indicative of risk for recurrent falls General Gait Details: +2 HHA with min A for support and to steady, pt with heavy breathing on RA. O2 sats 86%.    Stairs            Wheelchair Mobility    Modified Rankin (Stroke Patients Only) Modified Rankin (Stroke Patients Only) Pre-Morbid Rankin Score: Moderately severe disability Modified Rankin: Moderately severe disability     Balance Overall balance assessment: Needs assistance Sitting-balance support: Single extremity supported;Feet supported Sitting balance-Leahy Scale: Poor Sitting balance - Comments: pt needed min A to maintain sitting, partially because pt very restless EOB, sometimes trying to lie back down, sometimes trying to stand up   Standing balance support: Bilateral upper extremity supported Standing balance-Leahy Scale: Poor Standing balance comment: bilateral HHA to maintain standing                    Cognition Arousal/Alertness: Awake/alert Behavior During Therapy: Flat affect Overall Cognitive Status: No family/caregiver present to determine baseline cognitive functioning (following some commands today)                      Exercises      General Comments General comments (skin integrity, edema, etc.): O2 sats 94% on 2L O2, 86% on RA      Pertinent Vitals/Pain Pain Assessment: No/denies pain    Home Living                      Prior Function  PT Goals (current goals can now be found in the care plan section) Acute Rehab PT Goals Patient Stated Goal: None stated PT Goal Formulation: Patient unable to participate in goal setting Time For Goal Achievement: 09-Jul-2015 Potential to Achieve Goals: Fair Progress towards PT goals: Progressing toward goals    Frequency  Min 2X/week    PT Plan Current plan remains appropriate    Co-evaluation PT/OT/SLP Co-Evaluation/Treatment: Yes Reason for Co-Treatment: Complexity of the  patient's impairments (multi-system involvement);Necessary to address cognition/behavior during functional activity;For patient/therapist safety PT goals addressed during session: Mobility/safety with mobility;Balance;Strengthening/ROM       End of Session Equipment Utilized During Treatment: Gait belt;Oxygen Activity Tolerance: Patient tolerated treatment well Patient left: in chair;with chair alarm set;with call bell/phone within reach     Time: 0933-0955 PT Time Calculation (min) (ACUTE ONLY): 22 min  Charges:  $Gait Training: 8-22 mins                    G Codes:     Lyanne Co, PT  Acute Rehab Services  [phone]  Lyanne Co 06/21/2015, 12:35 PM

## 2015-06-21 NOTE — Progress Notes (Signed)
PATIENT DETAILS Name: Scott [name] Age: 75 y.o. Sex: male Date of Birth: 04-03-1941 Admit Date: 06/17/2015 Admitting Physician Lorretta Harp, MD PCP:No primary care provider on file.  Subjective: Much more awake and alert. Speaks one word sentences. Not gurgling.   Assessment/Plan: Principal Problem: Acute hypoxic respiratory failure: In the setting of CVA, dysphagia, and now aspiration pneumonia.Better control of oral secretions with IV Robinul/ Atropine/Unasyn. Continue O2 support and titrate down if able.   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 Carotid Doppler showed >80% left internal carotid artery stenosis.  Echocardiogram showed preserved ejection fraction without any embolic source.LDL 66, A1C 6.1. Doubt given overall clinical scenario patient a candidate for CEA.   Dysphagia: Secondary to above.Better control of oral secretions with Robinul and Atropine.SLP evaluation complete-on Dys 1 diet-family ok/aware of aspiration risk.They want to focus mostly on comfort.  Presumed aspiration pneumonia: Continue Unasyn-will transition to Augmentin on discharge.   Distended rectum/sigmoid colon: Secondary to colonic dysmotility-per GI this is commonly seen in institutionalized psychiatry patient's. Do not feel that this is clinically relevant-especially given above noted issues. Has a flexiseal in place for comfort.  Hypothyroidism: Continue Synthroid  History of mental retardation/schizophrenia: Initially all oral medications were held-will resume today  Fractures involving the sacrum bilaterally, medial pubic bones bilaterally, left inferior pubic ramus and right L5 transverse process: Age indeterminate but most likely subacute-per radiology report.Suspect secondary to numerous  recent falls. Supportive care only at this time.   Laceration right forehead: Sutured in the emergency room,tDap x 1.  Palliative care: Unfortunate patient with hx of mental retardation/schizophrenia-now with mod-large CVA with resultant dysphagia and aspiration PNA. Although improved -with better control of secretions-is still very hypoxic and remains at risk for further worsening. Given overall clinical picture-he remains with poor overall prognoses. Remains at risk for further aspiration and worsening resp failure. Family wanting to focus on comfort-but willing to continue with gentle treatment. Suspect will need SNF with hospice follow up on discharge, if deteriorates-or continues to require significant O2-may need residential hospice as well.   Disposition: Remain inpatient-d/c telemetry  Antimicrobial agents  See below  Anti-infectives    Start     Dose/Rate Route Frequency Ordered Stop   06/19/15 1100  Ampicillin-Sulbactam (UNASYN) 3 g in sodium chloride 0.9 % 100 mL IVPB     3 g 100 mL/hr over 60 Minutes Intravenous Every 6 hours 06/19/15 1054        DVT Prophylaxis: SCD's  Code Status:  DNR  Family Communication None at bedside today-spoke with brother in law  CONSULTS:  neurology  Time spent 25  minutes-Greater than 50% of this time was spent in counseling, explanation of diagnosis, planning of further management, and coordination of care.  MEDICATIONS: Scheduled Meds: .  stroke: mapping our early stages of recovery book   Does not apply Once  . ampicillin-sulbactam (UNASYN) IV  3 g Intravenous Q6H  . aspirin  300 mg Rectal Daily   Or  . aspirin  325 mg Oral Daily  . benztropine  0.5 mg Oral BID  . enoxaparin (LOVENOX) injection  40 mg Subcutaneous Q24H  . gabapentin  600 mg Oral TID  . levothyroxine  37.5 mcg Intravenous QAC breakfast  . omega-3 acid ethyl esters  1 g Oral Daily  . PARoxetine  20 mg Oral Daily  . risperiDONE  1 mg Oral q morning - 10a  .  risperiDONE  2 mg Oral QPM   Continuous Infusions:  PRN Meds:.glycopyrrolate, haloperidol lactate, LORazepam, morphine injection, senna-docusate, white petrolatum    PHYSICAL EXAM: Vital signs in last 24 hours: Filed Vitals:   06/20/15 1704 06/20/15 2200 06/21/15 0440 06/21/15 1005  BP: 132/90 141/89 135/81 145/84  Pulse: 113 98 81 75  Temp: 98.6 F (37 C) 98.6 F (37 C) 98.2 F (36.8 C) 98.2 F (36.8 C)  TempSrc: Oral Oral Oral Oral  Resp: 18 18 18 16   Height:      Weight:      SpO2: 93% 92% 95% 96%    Weight change:  Filed Weights   06/17/15 1935  Weight: 77.111 kg (170 lb)   Body mass index is 24.39 kg/(m^2).   Gen Exam: Awake/alert-speaks in one word sentence Neck: Supple, Chest:  transmitted upper airway sounds all over CVS: S1 S2 Regular Abdomen: soft, BS +, non tender, appears more  distended today .  Extremities: no edema Neurologic:  difficult exam-moves all 4 extremities to pain-but appears to have slight left-sided weakness   Intake/Output from previous day: No intake or output data in the 24 hours ending 06/21/15 1048   LAB RESULTS: CBC  Recent Labs Lab 06/17/15 1932 06/17/15 1944 06/19/15 0610 06/20/15 1000  WBC 9.3  --  14.2* 10.4  HGB 12.0* 12.9* 13.6 11.9*  HCT 36.5* 38.0* 39.9 35.8*  PLT 309  --  306 274  MCV 91.9  --  92.4 92.0  MCH 30.2  --  31.5 30.6  MCHC 32.9  --  34.1 33.2  RDW 12.7  --  12.7 12.8  LYMPHSABS 1.2  --   --   --   MONOABS 0.9  --   --   --   EOSABS 0.1  --   --   --   BASOSABS 0.0  --   --   --     Chemistries   Recent Labs Lab 06/17/15 1932 06/17/15 1944 06/19/15 0610 06/20/15 1000  NA 142 142 139 142  K 3.9 3.7 3.3* 3.5  CL 106 105 107 110  CO2 25  --  25 25  GLUCOSE 101* 95 124* 127*  BUN 15 18 8  23*  CREATININE 0.76 0.60* 0.68 0.58*  CALCIUM 8.6*  --  8.5* 8.5*    CBG:  Recent Labs Lab 06/17/15 1951 06/18/15 0818 06/19/15 0639 06/20/15 0635  GLUCAP 105* 95 116* 118*     GFR Estimated Creatinine Clearance: 82.4 mL/min (by C-G formula based on Cr of 0.58).  Coagulation profile  Recent Labs Lab 06/17/15 1932  INR 1.26    Cardiac Enzymes No results for input(s): CKMB, TROPONINI, MYOGLOBIN in the last 168 hours.  Invalid input(s): CK  Invalid input(s): POCBNP No results for input(s): DDIMER in the last 72 hours. No results for input(s): HGBA1C in the last 72 hours. No results for input(s): CHOL, HDL, LDLCALC, TRIG, CHOLHDL, LDLDIRECT in the last 72 hours. No results for input(s): TSH, T4TOTAL, T3FREE, THYROIDAB in the last 72 hours.  Invalid input(s): FREET3 No results for input(s): VITAMINB12, FOLATE, FERRITIN, TIBC, IRON, RETICCTPCT in the last 72 hours. No results for input(s): LIPASE, AMYLASE in the last 72 hours.  Urine Studies No results for input(s): UHGB, CRYS in the last 72 hours.  Invalid input(s): UACOL, UAPR, USPG, UPH, UTP, UGL,  UKET, UBIL, UNIT, UROB, ULEU, UEPI, UWBC, URBC, UBAC, CAST, UCOM, BILUA  MICROBIOLOGY: No results found for this or any previous visit (from the past 240 hour(s)).  RADIOLOGY STUDIES/RESULTS: Ct Angio Head W/cm &/or Wo Cm  06/19/2015  CLINICAL DATA:  Chronic mental retardation.  Stroke. EXAM: CT ANGIOGRAPHY HEAD AND NECK TECHNIQUE: Multidetector CT imaging of the head and neck was performed using the standard protocol during bolus administration of intravenous contrast. Multiplanar CT image reconstructions and MIPs were obtained to evaluate the vascular anatomy. Carotid stenosis measurements (when applicable) are obtained utilizing NASCET criteria, using the distal internal carotid diameter as the denominator. CONTRAST:  50mL OMNIPAQUE IOHEXOL 350 MG/ML SOLN COMPARISON:  MRI 06/18/2015.  CT 06/17/2015. FINDINGS: CT HEAD Low-density in the right frontal lobe consistent with the acute infarction in that region. No mass effect or shift. Old left frontal cortical and subcortical infarction. No other focal insult  visible. Widespread chronic small-vessel disease of the deep white matter. No hemorrhage, hydrocephalus or extra-axial collection. No skull fracture. Small amount of fluid in the sphenoid sinus. CTA NECK Aortic arch: Atherosclerosis of the arch but no aneurysm or dissection. Branching pattern of the brachiocephalic vessels is normal without origin stenosis. Right carotid system: Common carotid artery widely patent to the bifurcation. Mild atherosclerotic disease at the bifurcation but no stenosis or irregularity. Cervical internal carotid artery widely patent. Atherosclerotic calcification proximal to the skullbase with stenosis no more than 20%. Left carotid system: Common carotid artery widely patent to the bifurcation. Atherosclerotic disease affecting the distal common carotid artery with stenosis of 30% in that location. Atherosclerotic disease at the carotid bifurcation with severe stenosis at the distal bulb. Patent luminal diameter through that location measures 1 mm or less. The more distal cervical ICA shows reduced caliber because of reduced inflow. This likely represents a greater than 90% stenosis. Vertebral arteries:Both vertebral artery origins are widely patent with the right being dominant. Both vertebral arteries are widely patent through the cervical region. Skeleton: Advanced chronic cervical spondylosis.  No acute finding. Other neck: No other soft tissue lesion in the neck. The esophagus does appear to be thick walled. Layering pleural effusion on the right. CTA HEAD Anterior circulation: Both internal carotid arteries are patent through the siphon regions. There is considerable motion degradation in this region. Atherosclerotic calcification probably results in siphon narrowing on the left are approximately 50%. Anterior and middle cerebral vessels are patent without proximal stenosis, aneurysm or vascular malformation. Posterior circulation: Both vertebral arteries are patent to the basilar.  No basilar stenosis. Posterior circulation branch vessels are normal. Venous sinuses: Patent and normal Anatomic variants: None significant Delayed phase: No abnormal enhancement IMPRESSION: Low-density has now developed in the right frontal lobe in the region of acute infarction. No evidence of hemorrhage or mass effect. No right carotid bifurcation stenosis. Severe stenosis of the distal bulb on the left, 90% or greater, with diminished flow beyond that. No intracranial large or medium vessel occlusion or visible missing branches. Electronically Signed   By: Paulina Fusi M.D.   On: 06/19/2015 10:50   Ct Head Wo Contrast  06/17/2015  CLINICAL DATA:  Status post fall today.  Initial encounter. EXAM: CT HEAD WITHOUT CONTRAST TECHNIQUE: Contiguous axial images were obtained from the base of the skull through the vertex without intravenous contrast. COMPARISON:  None. FINDINGS: Contusion and laceration over right frontal bone are identified. There is no underlying fracture. The brain is atrophic with chronic microvascular ischemic change. Remote appearing left frontal  infarct is noted. No acute intracranial abnormality including hemorrhage, infarct, mass lesion, mass effect, midline shift or abnormal extra-axial fluid collection. IMPRESSION: Contusion and laceration over the right side of the frontal bone. Negative for fracture or acute intracranial abnormality. Atrophy, chronic microvascular ischemic change and remote left frontal infarct. These results were called by telephone at the time of interpretation on 06/17/2015 at 8:30 pm to Dr. Cathren Laine , who verbally acknowledged these results. Electronically Signed   By: Drusilla Kanner M.D.   On: 06/17/2015 20:30   Ct Angio Neck W/cm &/or Wo/cm  06/19/2015  CLINICAL DATA:  Chronic mental retardation.  Stroke. EXAM: CT ANGIOGRAPHY HEAD AND NECK TECHNIQUE: Multidetector CT imaging of the head and neck was performed using the standard protocol during bolus  administration of intravenous contrast. Multiplanar CT image reconstructions and MIPs were obtained to evaluate the vascular anatomy. Carotid stenosis measurements (when applicable) are obtained utilizing NASCET criteria, using the distal internal carotid diameter as the denominator. CONTRAST:  50mL OMNIPAQUE IOHEXOL 350 MG/ML SOLN COMPARISON:  MRI 06/18/2015.  CT 06/17/2015. FINDINGS: CT HEAD Low-density in the right frontal lobe consistent with the acute infarction in that region. No mass effect or shift. Old left frontal cortical and subcortical infarction. No other focal insult visible. Widespread chronic small-vessel disease of the deep white matter. No hemorrhage, hydrocephalus or extra-axial collection. No skull fracture. Small amount of fluid in the sphenoid sinus. CTA NECK Aortic arch: Atherosclerosis of the arch but no aneurysm or dissection. Branching pattern of the brachiocephalic vessels is normal without origin stenosis. Right carotid system: Common carotid artery widely patent to the bifurcation. Mild atherosclerotic disease at the bifurcation but no stenosis or irregularity. Cervical internal carotid artery widely patent. Atherosclerotic calcification proximal to the skullbase with stenosis no more than 20%. Left carotid system: Common carotid artery widely patent to the bifurcation. Atherosclerotic disease affecting the distal common carotid artery with stenosis of 30% in that location. Atherosclerotic disease at the carotid bifurcation with severe stenosis at the distal bulb. Patent luminal diameter through that location measures 1 mm or less. The more distal cervical ICA shows reduced caliber because of reduced inflow. This likely represents a greater than 90% stenosis. Vertebral arteries:Both vertebral artery origins are widely patent with the right being dominant. Both vertebral arteries are widely patent through the cervical region. Skeleton: Advanced chronic cervical spondylosis.  No acute  finding. Other neck: No other soft tissue lesion in the neck. The esophagus does appear to be thick walled. Layering pleural effusion on the right. CTA HEAD Anterior circulation: Both internal carotid arteries are patent through the siphon regions. There is considerable motion degradation in this region. Atherosclerotic calcification probably results in siphon narrowing on the left are approximately 50%. Anterior and middle cerebral vessels are patent without proximal stenosis, aneurysm or vascular malformation. Posterior circulation: Both vertebral arteries are patent to the basilar. No basilar stenosis. Posterior circulation branch vessels are normal. Venous sinuses: Patent and normal Anatomic variants: None significant Delayed phase: No abnormal enhancement IMPRESSION: Low-density has now developed in the right frontal lobe in the region of acute infarction. No evidence of hemorrhage or mass effect. No right carotid bifurcation stenosis. Severe stenosis of the distal bulb on the left, 90% or greater, with diminished flow beyond that. No intracranial large or medium vessel occlusion or visible missing branches. Electronically Signed   By: Paulina Fusi M.D.   On: 06/19/2015 10:50   Ct Cervical Spine Wo Contrast  06/18/2015  CLINICAL DATA:  Pain following fall EXAM: CT CERVICAL SPINE WITHOUT CONTRAST TECHNIQUE: Multidetector CT imaging of the cervical spine was performed without intravenous contrast. Multiplanar CT image reconstructions were also generated. COMPARISON:  None. FINDINGS: There is no demonstrable fracture or spondylolisthesis. The prevertebral soft tissues and predental space regions are normal. There is moderately severe disc space narrowing at C3-4, C4-5, C6-7, and C7-T1. There is multilevel facet hypertrophy. There is exit foraminal narrowing due to bony hypertrophy at C3-4 bilaterally, C4-5 bilaterally, C5-6 bilaterally, and C6-7 bilaterally. There is mild amount moderate spinal stenosis at C3-4  and C4-5 due to bony hypertrophy. There are foci of carotid artery calcification, more on the left than on the right. There is also atherosclerotic calcification in the aortic arch. IMPRESSION: No apparent fracture or spondylolisthesis. Multifocal osteoarthritic change. Mild to moderate spinal stenosis at C3-4 and C4-5 due to bony hypertrophy. Bilateral carotid artery calcification. Electronically Signed   By: Bretta Bang III M.D.   On: 06/18/2015 10:04   Mr Laqueta Jean ZO Contrast  06/18/2015  CLINICAL DATA:  Initial evaluation for fall, altered mental status, slurred speech. History of schizophrenia, depression. EXAM: MRI HEAD WITHOUT AND WITH CONTRAST TECHNIQUE: Multiplanar, multiecho pulse sequences of the brain and surrounding structures were obtained without and with intravenous contrast. CONTRAST:  15mL MULTIHANCE GADOBENATE DIMEGLUMINE 529 MG/ML IV SOLN COMPARISON:  Prior CT from 06/17/2015. FINDINGS: Diffuse prominence of the CSF containing spaces is compatible with generalized atrophy, moderate for age. Patchy and confluent T2/FLAIR hyperintensity within the periventricular and deep white matter most consistent with chronic small vessel ischemic changes, also moderate in nature. Small vessel type changes present within the pons. Focal encephalomalacia within the anterior left frontal lobe compatible with remote infarct. Additional small remote bilateral cerebellar infarcts noted. Scatter remote lacunar infarcts involve the bilateral basal ganglia. Confluent restricted diffusion involving the anterior right operculum the with extension into the right insular cortex and subinsular white matter consistent with acute right MCA territory infarct. Additional patchy infarct within knee right basal ganglia/corona radiata out. It is patchy cortical infarcts more posteriorly within the right parietal and occipital lobes. Additional small cortical infarct within the parasagittal left parietal lobe. There is  gyral swelling and edema on T2/FLAIR sequences without significant mass effect. Mild associated petechial hemorrhage within the right parietal lobe (Series 7, image 18). No other evidence for hemorrhage. Abnormal flow void within the left ICA and MCA, which may reflect slow flow and/or atheromatous disease. The left ICA itself may be occluded, although the left MCA is felt to be at least partially patent. Remainder of the intracranial vascular flow voids are patent, although atheromatous disease noted within the vertebrobasilar system. No mass lesion, midline shift, or mass effect. Ventricular prominence related to global parenchymal volume loss without hydrocephalus. No extra-axial fluid collection. No abnormal enhancement following contrast administration. Craniocervical junction within normal limits. Multilevel degenerative spondylolysis noted within the upper cervical spine. There is apparent moderate to severe canal stenosis at the C3-4 level. Pituitary gland within normal limits. No acute abnormality about the orbits. Mild scattered mucosal thickening within the ethmoidal air cells. Minimal layering opacity within the right sphenoid sinus. No significant mastoid effusion. Inner ear structures grossly normal. Bone marrow signal intensity within normal limits. Right frontal scalp contusion present. IMPRESSION: 1. Patchy moderate sized multi focal right MCA territory ischemic infarcts as above. Additional small cortical ischemic infarct within the parasagittal left parietal lobe. 2. Remote left MCA territory infarct with additional remote lacunar infarcts involving the bilateral basal  ganglia and cerebellar hemispheres. 3. Abnormal flow void within the left ICA and to a lesser extent left MCA, which may reflect slow flow and/or partial occlusion or occlusion. This is suspected to likely be chronic in nature given the remote left MCA territory infarct. 4. Moderate atrophy with chronic small vessel ischemic  disease. 5. Right frontal scalp contusion. Electronically Signed   By: Rise Mu M.D.   On: 06/18/2015 22:12   Ct Abdomen Pelvis W Contrast  06/18/2015  CLINICAL DATA:  75 year old male with fall, abdominal pain and distention. EXAM: CT ABDOMEN AND PELVIS WITH CONTRAST TECHNIQUE: Multidetector CT imaging of the abdomen and pelvis was performed using the standard protocol following bolus administration of intravenous contrast. CONTRAST:  75mL OMNIPAQUE IOHEXOL 300 MG/ML  SOLN COMPARISON:  None. FINDINGS: Lower chest: A tiny right pleural effusion is noted with small bibasilar atelectasis. Cardiomegaly and coronary artery calcifications are present. Hepatobiliary: Multiple cysts are identified. Cholelithiasis is identified without CT evidence of acute cholecystitis. There is no evidence of biliary dilatation. Pancreas: Unremarkable Spleen: Unremarkable Adrenals/Urinary Tract: Mild bilateral renal cortical thinning noted. There is no evidence of renal mass, urinary calculi or hydronephrosis. Stomach/Bowel: The sigmoid colon and rectum are distended with gas and fluid, with the sigmoid colon measuring up to 15 cm in greatest transverse dimension. Mild to moderate distention of the remainder of the colon with stool and gas noted. No definite obstructing lesion identified. There is no evidence of small bowel dilatation. Appendix is normal. Vascular/Lymphatic: Aortic atherosclerotic calcifications noted without aneurysm. No enlarged lymph nodes are identified. Reproductive: Prostate calcifications noted. Other: No free fluid, pneumoperitoneum or abscess. Musculoskeletal: An acute to subacute appearing fracture of the posterior right seventh rib is noted. Fractures involving the sacrum bilaterally, bilateral medial pubic bones, left inferior pubic ramus, and right L5 transverse process are age indeterminate but appear subacute. Heterotopic ossification adjacent to the left inferior pubic ramus fracture is  noted. Remote appearing left rib fractures are present. IMPRESSION: Moderate to severe distention of the sigmoid colon and rectum with the sigmoid colon measuring up to 15 cm in greatest transverse dimension. Mild to moderate distention of the remainder of the colon containing stool and gas. No identifiable obstructing process. No evidence of pneumoperitoneum or small bowel distention. Acute to subacute appearing fracture of the posterior right seventh rib. Tiny right pleural effusion. Fractures involving the sacrum bilaterally, medial pubic bones bilaterally, left inferior pubic ramus and right L5 transverse process - age indeterminate but appears subacute. Correlate with pain in history. Cholelithiasis without CT evidence of acute cholecystitis. Electronically Signed   By: Harmon Pier M.D.   On: 06/18/2015 10:29   Dg Chest Port 1 View  06/21/2015  CLINICAL DATA:  Shortness breath. EXAM: PORTABLE CHEST 1 VIEW COMPARISON:  06/19/2015.  CT 06/18/2015. FINDINGS: Mediastinum hilar structures are stable. Cardiomegaly with mild bilateral pulmonary interstitial prominence suggesting mild congestive heart failure. Low lung volumes with basilar atelectasis. Colonic distention again noted. Colonic interposition under the left hemidiaphragm again noted . IMPRESSION: 1. Cardiomegaly with mild bilateral from interstitial prominence suggesting congestive heart failure. 2. Persistent colonic distention. Colonic interposition of the left hemidiaphragm again noted . Electronically Signed   By: Maisie Fus  Register   On: 06/21/2015 07:35   Dg Chest Port 1 View  06/19/2015  CLINICAL DATA:  Shortness of breath EXAM: PORTABLE CHEST 1 VIEW COMPARISON:  06/17/2015 FINDINGS: Stable heart size with central vascular congestion and basilar atelectasis evident. No definite edema pattern or CHF. No focal  pneumonia, collapse or consolidation. No effusion or pneumothorax. Trachea midline. Atherosclerosis of the aorta. Diffuse distention of the  bowel in the epigastric region compatible with ileus. IMPRESSION: Increased vascular congestion and basilar atelectasis. Gaseous distention in the upper abdomen suspicious for ileus. Electronically Signed   By: Judie Petit.  Shick M.D.   On: 06/19/2015 09:00   Dg Chest Portable 1 View  06/17/2015  CLINICAL DATA:  Head trauma secondary to a fall today. Code stroke. Initial encounter. EXAM: PORTABLE CHEST 1 VIEW COMPARISON:  None. FINDINGS: The lungs are clear. Heart size is upper normal. No pneumothorax or pleural effusion. No focal bony abnormality. Gaseous distention of visualized bowel loops in the upper abdomen noted. IMPRESSION: No acute cardiopulmonary disease. Gaseous distention of visualized bowel loops is nonspecific but may be be due to ileus. Electronically Signed   By: Drusilla Kanner M.D.   On: 06/17/2015 20:10   Dg Abd 2 Views  06/19/2015  CLINICAL DATA:  Abdominal distention.  Unresponsive. EXAM: ABDOMEN - 2 VIEW COMPARISON:  CT 06/18/2015 FINDINGS: Severe gaseous distention of the colon. Large stool burden throughout the colon. Findings are similar to prior CT. Contrast material noted within the collecting systems of the kidneys bilaterally. No free air. IMPRESSION: Continued marked gaseous distention of the colon. Large stool burden. Electronically Signed   By: Charlett Nose M.D.   On: 06/19/2015 10:46    Jeoffrey Massed, MD  [facility] Pager:[phone]  If 7PM-7AM, please contact night-coverage [email or web address] Password [email or web address] 06/21/2015, 10:48 AM   LOS: 4 days

## 2015-06-21 NOTE — Care Management Note (Signed)
Case Management Note  Patient Details  Name: Scott [name] MRN: [id number] Date of Birth: 01/08/1941  Subjective/Objective:                    Action/Plan: Plan is for patient to return to [facility] at discharge with hospice. Patient not medically ready for discharge today. CM will continue to follow for discharge needs.   Expected Discharge Date:                  Expected Discharge Plan:     In-House Referral:     Discharge planning Services     Post Acute Care Choice:    Choice offered to:     DME Arranged:    DME Agency:     HH Arranged:    HH Agency:     Status of Service:  In process, will continue to follow  Medicare Important Message Given:    Date Medicare IM Given:    Medicare IM give by:    Date Additional Medicare IM Given:    Additional Medicare Important Message give by:     If discussed at Long Length of Stay Meetings, dates discussed:    Additional Comments:  Kermit Balo, RN 06/21/2015, 12:05 PM

## 2015-06-21 NOTE — Evaluation (Addendum)
Occupational Therapy Evaluation Patient Details Name: Scott [name] MRN: [id number] DOB: [birth year] Today's Date: 06/21/2015    History of Present Illness 75 y.o. male with PMH of hypothyroidism, CVA, depression, mental retardation, schizophrenia, who presented with fall, altered mental status and slurred speech. 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   Clinical Impression   Pt admitted with above. Feel pt will benefit from acute OT to increase independence prior to d/c. Recommending SNF for rehab. Unsure of pt's true PLOF.    Follow Up Recommendations  SNF;Supervision/Assistance - 24 hour    Equipment Recommendations  Other (comment) (defer to next venue)    Recommendations for Other Services       Precautions / Restrictions Precautions Precautions: Fall Precaution Comments: pt moves very quickly so watch lines carefully. Has rectal tube, IV, O2. Wearing mitts. Restrictions Weight Bearing Restrictions: No      Mobility Bed Mobility Overal bed mobility: Needs Assistance Bed Mobility: Supine to Sit     Supine to sit: Min assist     General bed mobility comments:  Pt very flexible and quickly kicks legs out as well as crossing them.   Transfers Overall transfer level: Needs assistance Equipment used: 2 person hand held assist Transfers: Sit to/from Stand Sit to Stand: Min assist;+2 physical assistance         General transfer comment: Min A +2 for safety and to steady, pt did not need boost to get up. Pt spastic with movement so +2 assist needed to control motion and move safely.     Balance Overall balance assessment: Needs assistance Sitting-balance support: Single extremity supported;Feet supported Sitting balance-Leahy Scale: Poor Sitting balance - Comments: pt needed min A to maintain sitting, partially because pt very restless EOB, sometimes trying to lie back down, sometimes trying to stand up    Standing balance support: Bilateral upper extremity supported Standing balance-Leahy Scale: Poor Standing balance comment: bilateral HHA to maintain standing                            ADL Overall ADL's : Needs assistance/impaired                 Upper Body Dressing : Maximal assistance;Sitting   Lower Body Dressing: Maximal assistance;Sit to/from stand   Toilet Transfer: Minimal assistance;+2 for physical assistance;Ambulation (sit to stand from bed-+2 Min assist and ambulated in room)           Functional mobility during ADLs: +2 for physical assistance;Minimal assistance (sit to stand from bed)       Vision     Perception     Praxis      Pertinent Vitals/Pain Pain Assessment: No/denies pain     Hand Dominance     Extremity/Trunk Assessment Upper Extremity Assessment Upper Extremity Assessment: Difficult to assess due to impaired cognition   Lower Extremity Assessment Lower Extremity Assessment: Defer to PT evaluation       Communication Communication Communication: Expressive difficulties   Cognition Arousal/Alertness: Awake/alert Behavior During Therapy: Flat affect Overall Cognitive Status: No family/caregiver present to determine baseline cognitive functioning; difficult to assess due to impaired communication.                     General Comments       Exercises       Shoulder Instructions      Home Living Family/patient expects to  be discharged to:: Unsure                                        Prior Functioning/Environment          Comments: unsure-per chart, assist with dressing and bathing and pt able to feed self.    OT Diagnosis: Generalized weakness;Cognitive deficits   OT Problem List: Decreased strength;Decreased activity tolerance;Impaired balance (sitting and/or standing);Decreased cognition;Decreased safety awareness;Decreased knowledge of use of DME or AE;Decreased knowledge of  precautions   OT Treatment/Interventions: Self-care/ADL training;DME and/or AE instruction;Energy conservation;Therapeutic activities;Cognitive remediation/compensation;Balance training;Patient/family education    OT Goals(Current goals can be found in the care plan section) Acute Rehab OT Goals Patient Stated Goal: None stated OT Goal Formulation: Patient unable to participate in goal setting Time For Goal Achievement: 07/05/15 Potential to Achieve Goals: Fair ADL Goals Pt Will Perform Lower Body Bathing: sit to/from stand;with mod assist Pt Will Perform Upper Body Dressing: with min assist;sitting Pt Will Perform Lower Body Dressing: with mod assist;sit to/from stand Pt Will Transfer to Toilet: with min assist;ambulating;bedside commode  OT Frequency: Min 2X/week   Barriers to D/C:            Co-evaluation PT/OT/SLP Co-Evaluation/Treatment: Yes Reason for Co-Treatment: Complexity of the patient's impairments (multi-system involvement);For patient/therapist safety PT goals addressed during session: Mobility/safety with mobility;Balance;Strengthening/ROM OT goals addressed during session: ADL's and self-care;Other (comment) (mobility)      End of Session Equipment Utilized During Treatment:  (Oxygen placed back on towards end of session.) Nurse Communication: Mobility status  Activity Tolerance: Patient tolerated treatment well Patient left: in chair;with call bell/phone within reach   Time: 1610-9604 OT Time Calculation (min): 20 min Charges:  OT General Charges $OT Visit: 1 Procedure OT Evaluation $OT Eval Moderate Complexity: 1 Procedure G-CodesEarlie Raveling OTR/L [phone] 06/21/2015, 12:57 PM

## 2015-06-22 DIAGNOSIS — R140 Abdominal distension (gaseous): Secondary | ICD-10-CM

## 2015-06-22 LAB — GLUCOSE, CAPILLARY: GLUCOSE-CAPILLARY: 109 mg/dL — AB (ref 65–99)

## 2015-06-22 MED ORDER — LORAZEPAM 1 MG PO TABS
1.0000 mg | ORAL_TABLET | Freq: Every day | ORAL | Status: DC
  Administered 2015-06-22: 1 mg via ORAL
  Filled 2015-06-22: qty 1

## 2015-06-22 MED ORDER — LORAZEPAM 2 MG/ML PO CONC: 0.5000 mg | Freq: Every morning | ORAL | Status: DC

## 2015-06-22 MED ORDER — LORAZEPAM 2 MG/ML PO CONC: 1.0000 mg | Freq: Three times a day (TID) | ORAL | Status: DC

## 2015-06-22 MED ORDER — LORAZEPAM 0.5 MG PO TABS
0.5000 mg | ORAL_TABLET | Freq: Every day | ORAL | Status: DC
  Administered 2015-06-23: 0.5 mg via ORAL
  Filled 2015-06-22: qty 1

## 2015-06-22 NOTE — Progress Notes (Signed)
CSW attempted to contact emergency contact person on facesheet, however received no answer.  CSW attempted to get in contact with Steward Drone from [facility] to inform of patient's status, recommendation for SNF placement, and obtain family information for patient. Unable to get in contact with her. Left voice message twice at 8:03am and 11:00am.   CSW will continue to follow and provide support to patient while in hospital.   Fernande Boyden, [facility] Clinical Social Worker [facility] Ph: [phone]

## 2015-06-22 NOTE — Care Management Important Message (Signed)
Important Message  Patient Details  Name: Scott [name] MRN: [id number] Date of Birth: 02-25-1941   Medicare Important Message Given:  Yes    Shatha Hooser P Japhet Morgenthaler 06/22/2015, 3:25 PM

## 2015-06-22 NOTE — Progress Notes (Signed)
Speech Language Pathology Treatment: Dysphagia  Patient Details Name: Scott [name] MRN: [id number] DOB: [birth year] Today's Date: 06/22/2015 Time: 3086-5784 SLP Time Calculation (min) (ACUTE ONLY): 11 min  Assessment / Plan / Recommendation Clinical Impression  Pt required max cueing, tactile stimulation today to maintain arousal and keep eyes open for tx.  Consumed limited POs, with pureed solids and nectars tolerated better than thin liquids, which continue to elicit a consistent cough.  Required max assist for feeding today. Per Palliative notes, the family would prefer to accept the risks of aspiration and allow POs for comfort. Continue dysphagia 1, nectars, with encouragement for self-feeding when able and careful hand-feeding when unable. Pt nonverbal today.     HPI HPI: 75 y.o. male with PMH of hypothyroidism, depression, intellectual disability, schizophrenia, who presents with fall, altered mental status and slurred speech. CCT demonstrates contusion/laceration over right side of frontal bone; atrophy/chronic microvascular ischemic change and remote left frontal infarct.      SLP Plan  Continue with current plan of care     Recommendations  Diet recommendations: Dysphagia 1 (puree);Nectar-thick liquid Liquids provided via: Cup Medication Administration: Crushed with puree Supervision: Patient able to self feed;Staff to assist with self feeding Compensations: Minimize environmental distractions;Slow rate;Small sips/bites;Follow solids with liquid Postural Changes and/or Swallow Maneuvers: Seated upright 90 degrees             Oral Care Recommendations: Oral care BID Follow up Recommendations: Skilled Nursing facility Plan: Continue with current plan of care     GO                [name] [name] 06/22/2015, 10:41 AM

## 2015-06-22 NOTE — Progress Notes (Signed)
Nutrition Follow-up   INTERVENTION:  Provide Magic cup TID with meals, each supplement provides 290 kcal and 9 grams of protein   NUTRITION DIAGNOSIS:   Predicted suboptimal nutrient intake related to dysphagia, inability to eat as evidenced by NPO status.  Ongoing- diet advanced, but PO intake remains inadequate  GOAL:   Patient will meet greater than or equal to 90% of their needs  Unmet  MONITOR:   PO intake, Supplement acceptance, Weight trends, Labs, Skin, I & O's  REASON FOR ASSESSMENT:   Low Braden    ASSESSMENT:   75 y.o. male with PMH of hypothyroidism, depression, intellectual disability, schizophrenia, who presents with fall, altered mental status and slurred speech. CCT demonstrates contusion/laceration over right side of frontal bone; atrophy/chronic microvascular ischemic change and remote left frontal infarct.   Pt was advanced to a dysphagia 1 diet with nectar-thick liquids on 1/29. Per RN/nurse tech, pt only ate about 10% of breakfast this morning due to aspiration. He slept through lunch today; RN reports pt has needed Ativan which causes him to sleep more. Per pt's sister, pt used to weight more, lost weight at a group home a while back and has been thin for a while now. Sister states that pt ate about 90% of lunch yesterday.  Pt asleep at time of visit; severe muscle wasting noted in patellar region and temples, moderate wasting of calves/thighs. ? Accuracy of recent weight.   Labs: low hemoglobin, low calcium  Diet Order:  DIET - DYS 1 Room service appropriate?: Yes; Fluid consistency:: Nectar Thick  Skin:  Reviewed, no issues  Last BM:  1/31  Height:   Ht Readings from Last 1 Encounters:  06/17/15  (1.778 m)    Weight:   Wt Readings from Last 1 Encounters:  06/17/15 170 lb (77.111 kg)    Ideal Body Weight:  75.5 kg  BMI:  Body mass index is 24.39 kg/(m^2).  Estimated Nutritional Needs:   Kcal:  1900-2100  Protein:  95-105  grams  Fluid:  1.9 - 2.1 L  EDUCATION NEEDS:   No education needs identified at this time  Dorothea Ogle RD, LDN Inpatient Clinical Dietitian Pager: [phone] After Hours Pager: [phone]

## 2015-06-22 NOTE — Progress Notes (Signed)
CSW spoke with HPOA Kennith Gain (sister) regarding disposition plans. Sister is agreeable to placement at [facility] on tomorrow. CSW provide family with address to facility for paperwork to be completed. Family would like to informed when patient is transferring to facility. Sister can be reached at [phone] or [phone].   CSW contacted facility representative Larita Fife at [facility] to inform that patient is not medically stable and ready for discharge. Per Larita Fife, patient can come tomorrow to facility. Discharge Summary to be sent via hub system.   CSW will continue to follow and provide support to patient and family while in hospital.   Fernande Boyden, [facility] Clinical Social Worker [facility] Ph: [phone]

## 2015-06-22 NOTE — Progress Notes (Signed)
Pharmacy Antibiotic Note  Scott [name] is a 75 y.o. male on day # 4 Unasyn for aspiration pneumonia coverage.  Plan:  Last chemistries 06/20/15.  Creatinine has been stable.  Will defer repeat labs with plans for transfer to facility tomorrow.  Noted plan to transition to Augmentin at discharge.  Height:  (177.8 cm) Weight: 170 lb (77.111 kg) IBW/kg (Calculated) : 73  Temp (24hrs), Avg:97.9 F (36.6 C), Min:97.5 F (36.4 C), Max:98.4 F (36.9 C)   Recent Labs Lab 06/17/15 1932 06/17/15 1944 06/19/15 0610 06/20/15 1000  WBC 9.3  --  14.2* 10.4  CREATININE 0.76 0.60* 0.68 0.58*  LATICACIDVEN  --  1.43  --   --     Estimated Creatinine Clearance: 82.4 mL/min (by C-G formula based on Cr of 0.58).    No Known Allergies  Antimicrobials this admission:   Unasyn 1/28>>  Dose adjustments this admission:   none  Microbiology results:   No cultures  Dennie Fetters, RPh Pager: [phone] 06/22/2015 4:22 PM

## 2015-06-22 NOTE — Progress Notes (Signed)
PATIENT DETAILS Name: Scott [name] Age: 75 y.o. Sex: male Date of Birth: 1940/10/26 Admit Date: 06/17/2015 Admitting Physician Lorretta Harp, MD PCP:No primary care provider on file.  Subjective: Much more awake and alert. Per nursing staff-agitated at times, sustained a fall yesterday.  Assessment/Plan: Principal Problem: Acute hypoxic respiratory failure: In the setting of CVA, dysphagia, and now aspiration pneumonia.Better control of oral secretions with IV Robinul/ Atropine/Unasyn. Currently on 5-6 L of oxygen. Continue O2 support and titrate down if able.   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 Carotid Doppler showed >80% left internal carotid artery stenosis.  Echocardiogram showed preserved ejection fraction without any embolic source.LDL 66, A1C 6.1. Doubt given overall clinical scenario patient a candidate for CEA.   Dysphagia: Secondary to above.Better control of oral secretions with Robinul and Atropine.SLP evaluation complete-on Dys 1 diet-family ok/aware of aspiration risk.They want to focus mostly on comfort.  Presumed aspiration pneumonia: Continue Unasyn-will transition to Augmentin on discharge.   Distended rectum/sigmoid colon: Secondary to colonic dysmotility-per GI this is commonly seen in institutionalized psychiatry patient's. Do not feel that this is clinically relevant-especially given above noted issues. Has a flexiseal in place for comfort.  Hypothyroidism: Continue Synthroid  History of mental retardation/schizophrenia: Initially all oral medications were held-now resume. Add scheduled Ativan.  Fractures involving the sacrum bilaterally, medial pubic bones bilaterally, left inferior pubic ramus and right L5 transverse process: Age indeterminate but most  likely subacute-per radiology report.Suspect secondary to numerous recent falls. Supportive care only at this time.   Laceration right forehead: Sutured in the emergency room,tDap x 1.  Palliative care: Unfortunate patient with hx of mental retardation/schizophrenia-now with mod-large CVA with resultant dysphagia, some dysarthria and aspiration PNA. Although improved -with better control of secretions-is still very hypoxic and remains at risk for further worsening. Given overall clinical picture-he remains with poor overall prognoses. Remains at risk for further aspiration and worsening resp failure. Family wanting to focus on comfort-but willing to continue with gentle treatment. Suspect will need SNF with hospice follow up on discharge, if deteriorates-or continues to require significant O2-may need residential hospice as well.   Disposition: Remain inpatient-SNF on discharge  Antimicrobial agents  See below  Anti-infectives    Start     Dose/Rate Route Frequency Ordered Stop   06/19/15 1100  Ampicillin-Sulbactam (UNASYN) 3 g in sodium chloride 0.9 % 100 mL IVPB     3 g 100 mL/hr over 60 Minutes Intravenous Every 6 hours 06/19/15 1054        DVT Prophylaxis: SCD's  Code Status:  DNR  Family Communication None at bedside   CONSULTS:  neurology  Time spent 25  minutes-Greater than 50% of this time was spent in counseling, explanation of diagnosis, planning of further management, and coordination of care.  MEDICATIONS: Scheduled Meds: .  stroke: mapping our early stages of recovery book   Does not apply Once  . ampicillin-sulbactam (UNASYN) IV  3 g Intravenous Q6H  . aspirin  300 mg Rectal Daily   Or  . aspirin  325 mg Oral Daily  . benztropine  0.5 mg Oral BID  . enoxaparin (LOVENOX) injection  40 mg Subcutaneous Q24H  . gabapentin  600 mg Oral TID  . levothyroxine  75 mcg  Oral QAC breakfast  . LORazepam  1 mg Oral TID  . omega-3 acid ethyl esters  1 g Oral Daily  .  PARoxetine  20 mg Oral Daily  . risperiDONE  1 mg Oral q morning - 10a  . risperiDONE  2 mg Oral QPM   Continuous Infusions:  PRN Meds:.glycopyrrolate, haloperidol lactate, LORazepam, morphine injection, senna-docusate, white petrolatum    PHYSICAL EXAM: Vital signs in last 24 hours: Filed Vitals:   06/21/15 2100 06/22/15 0152 06/22/15 0644 06/22/15 1020  BP: 117/69 117/79 117/70 119/73  Pulse: 78 74 69 92  Temp: 98 F (36.7 C) 97.5 F (36.4 C) 97.8 F (36.6 C) 97.8 F (36.6 C)  TempSrc: Axillary Axillary Axillary Axillary  Resp: Height:      Weight:      SpO2: 100% 95% 100% 98%    Weight change:  Filed Weights   06/17/15 1935  Weight: 77.111 kg (170 lb)   Body mass index is 24.39 kg/(m^2).   Gen Exam: Awake/alert-speaks in one word sentence Neck: Supple, Chest:  clear to auscultation anteriorly  CVS: S1 S2 Regular Abdomen: soft, BS +, non tender, appears more  distended today .  Extremities: no edema Neurologic:  difficult exam-moves all 4 extremities  Intake/Output from previous day: No intake or output data in the 24 hours ending 06/22/15 1141   LAB RESULTS: CBC  Recent Labs Lab 06/17/15 1932 06/17/15 1944 06/19/15 0610 06/20/15 1000  WBC 9.3  --  14.2* 10.4  HGB 12.0* 12.9* 13.6 11.9*  HCT 36.5* 38.0* 39.9 35.8*  PLT 309  --  306 274  MCV 91.9  --  92.4 92.0  MCH 30.2  --  31.5 30.6  MCHC 32.9  --  34.1 33.2  RDW 12.7  --  12.7 12.8  LYMPHSABS 1.2  --   --   --   MONOABS 0.9  --   --   --   EOSABS 0.1  --   --   --   BASOSABS 0.0  --   --   --     Chemistries   Recent Labs Lab 06/17/15 1932 06/17/15 1944 06/19/15 0610 06/20/15 1000  NA 142 142 139 142  K 3.9 3.7 3.3* 3.5  CL 106 105 107 110  CO2 25  --  25 25  GLUCOSE 101* 95 124* 127*  BUN 23*  CREATININE 0.76 0.60* 0.68 0.58*  CALCIUM 8.6*  --  8.5* 8.5*    CBG:  Recent Labs Lab 06/17/15 1951 06/18/15 0818 06/19/15 0639 06/20/15 0635  06/22/15 0621  GLUCAP 105* 95 116* 118* 109*    GFR Estimated Creatinine Clearance: 82.4 mL/min (by C-G formula based on Cr of 0.58).  Coagulation profile  Recent Labs Lab 06/17/15 1932  INR 1.26    Cardiac Enzymes No results for input(s): CKMB, TROPONINI, MYOGLOBIN in the last 168 hours.  Invalid input(s): CK  Invalid input(s): POCBNP No results for input(s): DDIMER in the last 72 hours. No results for input(s): HGBA1C in the last 72 hours. No results for input(s): CHOL, HDL, LDLCALC, TRIG, CHOLHDL, LDLDIRECT in the last 72 hours. No results for input(s): TSH, T4TOTAL, T3FREE, THYROIDAB in the last 72 hours.  Invalid input(s): FREET3 No results for input(s): VITAMINB12, FOLATE, FERRITIN, TIBC, IRON, RETICCTPCT in the last 72 hours. No results for input(s): LIPASE, AMYLASE in the last 72 hours.  Urine Studies No results for input(s): UHGB, CRYS in the last  72 hours.  Invalid input(s): UACOL, UAPR, USPG, UPH, UTP, UGL, UKET, UBIL, UNIT, UROB, ULEU, UEPI, UWBC, URBC, UBAC, CAST, UCOM, BILUA  MICROBIOLOGY: No results found for this or any previous visit (from the past 240 hour(s)).  RADIOLOGY STUDIES/RESULTS: Ct Angio Head W/cm &/or Wo Cm  06/19/2015  CLINICAL DATA:  Chronic mental retardation.  Stroke. EXAM: CT ANGIOGRAPHY HEAD AND NECK TECHNIQUE: Multidetector CT imaging of the head and neck was performed using the standard protocol during bolus administration of intravenous contrast. Multiplanar CT image reconstructions and MIPs were obtained to evaluate the vascular anatomy. Carotid stenosis measurements (when applicable) are obtained utilizing NASCET criteria, using the distal internal carotid diameter as the denominator. CONTRAST:  50mL OMNIPAQUE IOHEXOL 350 MG/ML SOLN COMPARISON:  MRI 06/18/2015.  CT 06/17/2015. FINDINGS: CT HEAD Low-density in the right frontal lobe consistent with the acute infarction in that region. No mass effect or shift. Old left frontal cortical  and subcortical infarction. No other focal insult visible. Widespread chronic small-vessel disease of the deep white matter. No hemorrhage, hydrocephalus or extra-axial collection. No skull fracture. Small amount of fluid in the sphenoid sinus. CTA NECK Aortic arch: Atherosclerosis of the arch but no aneurysm or dissection. Branching pattern of the brachiocephalic vessels is normal without origin stenosis. Right carotid system: Common carotid artery widely patent to the bifurcation. Mild atherosclerotic disease at the bifurcation but no stenosis or irregularity. Cervical internal carotid artery widely patent. Atherosclerotic calcification proximal to the skullbase with stenosis no more than 20%. Left carotid system: Common carotid artery widely patent to the bifurcation. Atherosclerotic disease affecting the distal common carotid artery with stenosis of 30% in that location. Atherosclerotic disease at the carotid bifurcation with severe stenosis at the distal bulb. Patent luminal diameter through that location measures 1 mm or less. The more distal cervical ICA shows reduced caliber because of reduced inflow. This likely represents a greater than 90% stenosis. Vertebral arteries:Both vertebral artery origins are widely patent with the right being dominant. Both vertebral arteries are widely patent through the cervical region. Skeleton: Advanced chronic cervical spondylosis.  No acute finding. Other neck: No other soft tissue lesion in the neck. The esophagus does appear to be thick walled. Layering pleural effusion on the right. CTA HEAD Anterior circulation: Both internal carotid arteries are patent through the siphon regions. There is considerable motion degradation in this region. Atherosclerotic calcification probably results in siphon narrowing on the left are approximately 50%. Anterior and middle cerebral vessels are patent without proximal stenosis, aneurysm or vascular malformation. Posterior circulation:  Both vertebral arteries are patent to the basilar. No basilar stenosis. Posterior circulation branch vessels are normal. Venous sinuses: Patent and normal Anatomic variants: None significant Delayed phase: No abnormal enhancement IMPRESSION: Low-density has now developed in the right frontal lobe in the region of acute infarction. No evidence of hemorrhage or mass effect. No right carotid bifurcation stenosis. Severe stenosis of the distal bulb on the left, 90% or greater, with diminished flow beyond that. No intracranial large or medium vessel occlusion or visible missing branches. Electronically Signed   By: Paulina Fusi M.D.   On: 06/19/2015 10:50   Ct Head Wo Contrast  06/17/2015  CLINICAL DATA:  Status post fall today.  Initial encounter. EXAM: CT HEAD WITHOUT CONTRAST TECHNIQUE: Contiguous axial images were obtained from the base of the skull through the vertex without intravenous contrast. COMPARISON:  None. FINDINGS: Contusion and laceration over right frontal bone are identified. There is no underlying fracture. The brain  is atrophic with chronic microvascular ischemic change. Remote appearing left frontal infarct is noted. No acute intracranial abnormality including hemorrhage, infarct, mass lesion, mass effect, midline shift or abnormal extra-axial fluid collection. IMPRESSION: Contusion and laceration over the right side of the frontal bone. Negative for fracture or acute intracranial abnormality. Atrophy, chronic microvascular ischemic change and remote left frontal infarct. These results were called by telephone at the time of interpretation on 06/17/2015 at 8:30 pm to Dr. Cathren Laine , who verbally acknowledged these results. Electronically Signed   By: Drusilla Kanner M.D.   On: 06/17/2015 20:30   Ct Angio Neck W/cm &/or Wo/cm  06/19/2015  CLINICAL DATA:  Chronic mental retardation.  Stroke. EXAM: CT ANGIOGRAPHY HEAD AND NECK TECHNIQUE: Multidetector CT imaging of the head and neck was  performed using the standard protocol during bolus administration of intravenous contrast. Multiplanar CT image reconstructions and MIPs were obtained to evaluate the vascular anatomy. Carotid stenosis measurements (when applicable) are obtained utilizing NASCET criteria, using the distal internal carotid diameter as the denominator. CONTRAST:  50mL OMNIPAQUE IOHEXOL 350 MG/ML SOLN COMPARISON:  MRI 06/18/2015.  CT 06/17/2015. FINDINGS: CT HEAD Low-density in the right frontal lobe consistent with the acute infarction in that region. No mass effect or shift. Old left frontal cortical and subcortical infarction. No other focal insult visible. Widespread chronic small-vessel disease of the deep white matter. No hemorrhage, hydrocephalus or extra-axial collection. No skull fracture. Small amount of fluid in the sphenoid sinus. CTA NECK Aortic arch: Atherosclerosis of the arch but no aneurysm or dissection. Branching pattern of the brachiocephalic vessels is normal without origin stenosis. Right carotid system: Common carotid artery widely patent to the bifurcation. Mild atherosclerotic disease at the bifurcation but no stenosis or irregularity. Cervical internal carotid artery widely patent. Atherosclerotic calcification proximal to the skullbase with stenosis no more than 20%. Left carotid system: Common carotid artery widely patent to the bifurcation. Atherosclerotic disease affecting the distal common carotid artery with stenosis of 30% in that location. Atherosclerotic disease at the carotid bifurcation with severe stenosis at the distal bulb. Patent luminal diameter through that location measures 1 mm or less. The more distal cervical ICA shows reduced caliber because of reduced inflow. This likely represents a greater than 90% stenosis. Vertebral arteries:Both vertebral artery origins are widely patent with the right being dominant. Both vertebral arteries are widely patent through the cervical region. Skeleton:  Advanced chronic cervical spondylosis.  No acute finding. Other neck: No other soft tissue lesion in the neck. The esophagus does appear to be thick walled. Layering pleural effusion on the right. CTA HEAD Anterior circulation: Both internal carotid arteries are patent through the siphon regions. There is considerable motion degradation in this region. Atherosclerotic calcification probably results in siphon narrowing on the left are approximately 50%. Anterior and middle cerebral vessels are patent without proximal stenosis, aneurysm or vascular malformation. Posterior circulation: Both vertebral arteries are patent to the basilar. No basilar stenosis. Posterior circulation branch vessels are normal. Venous sinuses: Patent and normal Anatomic variants: None significant Delayed phase: No abnormal enhancement IMPRESSION: Low-density has now developed in the right frontal lobe in the region of acute infarction. No evidence of hemorrhage or mass effect. No right carotid bifurcation stenosis. Severe stenosis of the distal bulb on the left, 90% or greater, with diminished flow beyond that. No intracranial large or medium vessel occlusion or visible missing branches. Electronically Signed   By: Paulina Fusi M.D.   On: 06/19/2015 10:50  Ct Cervical Spine Wo Contrast  06/18/2015  CLINICAL DATA:  Pain following fall EXAM: CT CERVICAL SPINE WITHOUT CONTRAST TECHNIQUE: Multidetector CT imaging of the cervical spine was performed without intravenous contrast. Multiplanar CT image reconstructions were also generated. COMPARISON:  None. FINDINGS: There is no demonstrable fracture or spondylolisthesis. The prevertebral soft tissues and predental space regions are normal. There is moderately severe disc space narrowing at C3-4, C4-5, C6-7, and C7-T1. There is multilevel facet hypertrophy. There is exit foraminal narrowing due to bony hypertrophy at C3-4 bilaterally, C4-5 bilaterally, C5-6 bilaterally, and C6-7 bilaterally.  There is mild amount moderate spinal stenosis at C3-4 and C4-5 due to bony hypertrophy. There are foci of carotid artery calcification, more on the left than on the right. There is also atherosclerotic calcification in the aortic arch. IMPRESSION: No apparent fracture or spondylolisthesis. Multifocal osteoarthritic change. Mild to moderate spinal stenosis at C3-4 and C4-5 due to bony hypertrophy. Bilateral carotid artery calcification. Electronically Signed   By: Bretta Bang III M.D.   On: 06/18/2015 10:04   Mr Laqueta Jean ZO Contrast  06/18/2015  CLINICAL DATA:  Initial evaluation for fall, altered mental status, slurred speech. History of schizophrenia, depression. EXAM: MRI HEAD WITHOUT AND WITH CONTRAST TECHNIQUE: Multiplanar, multiecho pulse sequences of the brain and surrounding structures were obtained without and with intravenous contrast. CONTRAST:  15mL MULTIHANCE GADOBENATE DIMEGLUMINE 529 MG/ML IV SOLN COMPARISON:  Prior CT from 06/17/2015. FINDINGS: Diffuse prominence of the CSF containing spaces is compatible with generalized atrophy, moderate for age. Patchy and confluent T2/FLAIR hyperintensity within the periventricular and deep white matter most consistent with chronic small vessel ischemic changes, also moderate in nature. Small vessel type changes present within the pons. Focal encephalomalacia within the anterior left frontal lobe compatible with remote infarct. Additional small remote bilateral cerebellar infarcts noted. Scatter remote lacunar infarcts involve the bilateral basal ganglia. Confluent restricted diffusion involving the anterior right operculum the with extension into the right insular cortex and subinsular white matter consistent with acute right MCA territory infarct. Additional patchy infarct within knee right basal ganglia/corona radiata out. It is patchy cortical infarcts more posteriorly within the right parietal and occipital lobes. Additional small cortical infarct  within the parasagittal left parietal lobe. There is gyral swelling and edema on T2/FLAIR sequences without significant mass effect. Mild associated petechial hemorrhage within the right parietal lobe (Series 7, image 18). No other evidence for hemorrhage. Abnormal flow void within the left ICA and MCA, which may reflect slow flow and/or atheromatous disease. The left ICA itself may be occluded, although the left MCA is felt to be at least partially patent. Remainder of the intracranial vascular flow voids are patent, although atheromatous disease noted within the vertebrobasilar system. No mass lesion, midline shift, or mass effect. Ventricular prominence related to global parenchymal volume loss without hydrocephalus. No extra-axial fluid collection. No abnormal enhancement following contrast administration. Craniocervical junction within normal limits. Multilevel degenerative spondylolysis noted within the upper cervical spine. There is apparent moderate to severe canal stenosis at the C3-4 level. Pituitary gland within normal limits. No acute abnormality about the orbits. Mild scattered mucosal thickening within the ethmoidal air cells. Minimal layering opacity within the right sphenoid sinus. No significant mastoid effusion. Inner ear structures grossly normal. Bone marrow signal intensity within normal limits. Right frontal scalp contusion present. IMPRESSION: 1. Patchy moderate sized multi focal right MCA territory ischemic infarcts as above. Additional small cortical ischemic infarct within the parasagittal left parietal lobe. 2. Remote left MCA  territory infarct with additional remote lacunar infarcts involving the bilateral basal ganglia and cerebellar hemispheres. 3. Abnormal flow void within the left ICA and to a lesser extent left MCA, which may reflect slow flow and/or partial occlusion or occlusion. This is suspected to likely be chronic in nature given the remote left MCA territory infarct. 4.  Moderate atrophy with chronic small vessel ischemic disease. 5. Right frontal scalp contusion. Electronically Signed   By: Rise Mu M.D.   On: 06/18/2015 22:12   Ct Abdomen Pelvis W Contrast  06/18/2015  CLINICAL DATA:  75 year old male with fall, abdominal pain and distention. EXAM: CT ABDOMEN AND PELVIS WITH CONTRAST TECHNIQUE: Multidetector CT imaging of the abdomen and pelvis was performed using the standard protocol following bolus administration of intravenous contrast. CONTRAST:  75mL OMNIPAQUE IOHEXOL 300 MG/ML  SOLN COMPARISON:  None. FINDINGS: Lower chest: A tiny right pleural effusion is noted with small bibasilar atelectasis. Cardiomegaly and coronary artery calcifications are present. Hepatobiliary: Multiple cysts are identified. Cholelithiasis is identified without CT evidence of acute cholecystitis. There is no evidence of biliary dilatation. Pancreas: Unremarkable Spleen: Unremarkable Adrenals/Urinary Tract: Mild bilateral renal cortical thinning noted. There is no evidence of renal mass, urinary calculi or hydronephrosis. Stomach/Bowel: The sigmoid colon and rectum are distended with gas and fluid, with the sigmoid colon measuring up to 15 cm in greatest transverse dimension. Mild to moderate distention of the remainder of the colon with stool and gas noted. No definite obstructing lesion identified. There is no evidence of small bowel dilatation. Appendix is normal. Vascular/Lymphatic: Aortic atherosclerotic calcifications noted without aneurysm. No enlarged lymph nodes are identified. Reproductive: Prostate calcifications noted. Other: No free fluid, pneumoperitoneum or abscess. Musculoskeletal: An acute to subacute appearing fracture of the posterior right seventh rib is noted. Fractures involving the sacrum bilaterally, bilateral medial pubic bones, left inferior pubic ramus, and right L5 transverse process are age indeterminate but appear subacute. Heterotopic ossification  adjacent to the left inferior pubic ramus fracture is noted. Remote appearing left rib fractures are present. IMPRESSION: Moderate to severe distention of the sigmoid colon and rectum with the sigmoid colon measuring up to 15 cm in greatest transverse dimension. Mild to moderate distention of the remainder of the colon containing stool and gas. No identifiable obstructing process. No evidence of pneumoperitoneum or small bowel distention. Acute to subacute appearing fracture of the posterior right seventh rib. Tiny right pleural effusion. Fractures involving the sacrum bilaterally, medial pubic bones bilaterally, left inferior pubic ramus and right L5 transverse process - age indeterminate but appears subacute. Correlate with pain in history. Cholelithiasis without CT evidence of acute cholecystitis. Electronically Signed   By: Harmon Pier M.D.   On: 06/18/2015 10:29   Dg Chest Port 1 View  06/21/2015  CLINICAL DATA:  Shortness breath. EXAM: PORTABLE CHEST 1 VIEW COMPARISON:  06/19/2015.  CT 06/18/2015. FINDINGS: Mediastinum hilar structures are stable. Cardiomegaly with mild bilateral pulmonary interstitial prominence suggesting mild congestive heart failure. Low lung volumes with basilar atelectasis. Colonic distention again noted. Colonic interposition under the left hemidiaphragm again noted . IMPRESSION: 1. Cardiomegaly with mild bilateral from interstitial prominence suggesting congestive heart failure. 2. Persistent colonic distention. Colonic interposition of the left hemidiaphragm again noted . Electronically Signed   By: Maisie Fus  Register   On: 06/21/2015 07:35   Dg Chest Port 1 View  06/19/2015  CLINICAL DATA:  Shortness of breath EXAM: PORTABLE CHEST 1 VIEW COMPARISON:  06/17/2015 FINDINGS: Stable heart size with central vascular congestion and  basilar atelectasis evident. No definite edema pattern or CHF. No focal pneumonia, collapse or consolidation. No effusion or pneumothorax. Trachea midline.  Atherosclerosis of the aorta. Diffuse distention of the bowel in the epigastric region compatible with ileus. IMPRESSION: Increased vascular congestion and basilar atelectasis. Gaseous distention in the upper abdomen suspicious for ileus. Electronically Signed   By: Judie Petit.  Shick M.D.   On: 06/19/2015 09:00   Dg Chest Portable 1 View  06/17/2015  CLINICAL DATA:  Head trauma secondary to a fall today. Code stroke. Initial encounter. EXAM: PORTABLE CHEST 1 VIEW COMPARISON:  None. FINDINGS: The lungs are clear. Heart size is upper normal. No pneumothorax or pleural effusion. No focal bony abnormality. Gaseous distention of visualized bowel loops in the upper abdomen noted. IMPRESSION: No acute cardiopulmonary disease. Gaseous distention of visualized bowel loops is nonspecific but may be be due to ileus. Electronically Signed   By: Drusilla Kanner M.D.   On: 06/17/2015 20:10   Dg Abd 2 Views  06/19/2015  CLINICAL DATA:  Abdominal distention.  Unresponsive. EXAM: ABDOMEN - 2 VIEW COMPARISON:  CT 06/18/2015 FINDINGS: Severe gaseous distention of the colon. Large stool burden throughout the colon. Findings are similar to prior CT. Contrast material noted within the collecting systems of the kidneys bilaterally. No free air. IMPRESSION: Continued marked gaseous distention of the colon. Large stool burden. Electronically Signed   By: Charlett Nose M.D.   On: 06/19/2015 10:46    Jeoffrey Massed, MD  [facility] Pager:[phone]  If 7PM-7AM, please contact night-coverage [email or web address] Password [email or web address] 06/22/2015, 11:41 AM   LOS: 5 days

## 2015-06-23 DIAGNOSIS — F329 Major depressive disorder, single episode, unspecified: Secondary | ICD-10-CM

## 2015-06-23 LAB — GLUCOSE, CAPILLARY: GLUCOSE-CAPILLARY: 107 mg/dL — AB (ref 65–99)

## 2015-06-23 MED ORDER — SENNOSIDES-DOCUSATE SODIUM 8.6-50 MG PO TABS: 2.0000 | ORAL_TABLET | Freq: Every day | ORAL | Status: AC

## 2015-06-23 MED ORDER — LORAZEPAM 1 MG PO TABS: 1.0000 mg | ORAL_TABLET | Freq: Every day | ORAL | Status: AC

## 2015-06-23 MED ORDER — ACETAMINOPHEN 325 MG PO TABS
650.0000 mg | ORAL_TABLET | Freq: Four times a day (QID) | ORAL | Status: DC
  Administered 2015-06-23: 650 mg via ORAL
  Filled 2015-06-23: qty 2

## 2015-06-23 MED ORDER — LORAZEPAM 0.5 MG PO TABS: 0.5000 mg | ORAL_TABLET | Freq: Every day | ORAL | Status: AC

## 2015-06-23 MED ORDER — MORPHINE SULFATE (CONCENTRATE) 10 MG /0.5 ML PO SOLN: 5.0000 mg | ORAL | Status: AC | PRN

## 2015-06-23 NOTE — Clinical Social Work Placement (Signed)
   CLINICAL SOCIAL WORK PLACEMENT  NOTE  Date:  06/23/2015  Patient Details  Name: Scott [name] MRN: [id number] Date of Birth: 12-22-1940  Clinical Social Work is seeking post-discharge placement for this patient at the Skilled  Nursing Facility level of care (*CSW will initial, date and re-position this form in  chart as items are completed):  Yes   Patient/family provided with [facility] Clinical Social Work Department's list of facilities offering this level of care within the geographic area requested by the patient (or if unable, by the patient's family).  Yes   Patient/family informed of their freedom to choose among providers that offer the needed level of care, that participate in Medicare, Medicaid or managed care program needed by the patient, have an available bed and are willing to accept the patient.  Yes   Patient/family informed of [facility]'s ownership interest in [facility] and [facility], as well as of the fact that they are under no obligation to receive care at these facilities.  PASRR submitted to EDS on 06/21/15     PASRR number received on 06/22/15     Existing PASRR number confirmed on       FL2 transmitted to all facilities in geographic area requested by pt/family on       FL2 transmitted to all facilities within larger geographic area on 06/21/15     Patient informed that his/her managed care company has contracts with or will negotiate with certain facilities, including the following:        Yes   Patient/family informed of bed offers received.  [facility] bed at  [facility]     Physician recommends and patient chooses bed at      Patient to be transferred to  [facility] on 06/23/15.  Patient to be transferred to facility by  [name] )     Patient family notified on 06/23/15 of transfer.  Name of family member notified:   (Pt's sister, Darl Pikes )     PHYSICIAN Please sign FL2     Additional Comment:     _______________________________________________ Vaughan Browner, LCSW 06/23/2015, 11:33 AM

## 2015-06-23 NOTE — Progress Notes (Signed)
Called to give report to Rn at [facility], [place]!

## 2015-06-23 NOTE — Clinical Social Work Note (Signed)
Clinical Social Worker facilitated patient discharge including contacting patient family and facility to confirm patient discharge plans.  Clinical information faxed to facility and family agreeable with plan.  CSW arranged ambulance transport via [facility] to [place].  RN to call report prior to discharge.  Clinical Social Worker will sign off for now as social work intervention is no longer needed. Please consult [place] again if new need arises.  Derenda Fennel, MSW, LCSWA [phone] 06/23/2015 11:31 AM

## 2015-06-23 NOTE — Progress Notes (Signed)
Discharge orders received, Pt for discharge to [facility]. IV d/c'd. D/c instructions and RX given with verbalized understanding. [facility] transferred patient. 06/23/15 1234

## 2015-06-23 NOTE — Progress Notes (Signed)
Patient is most appropriate for hospice services at SNF- if dual medicare/medicaid strongly recommend that a hospice referral be sent at the time of discharge to make sure that he is followed at [facility]- our team will evaluate on 2/1 given the documented decline and increased agitation.

## 2015-06-23 NOTE — Progress Notes (Signed)
Physical Therapy Treatment Patient Details Name: Scott [name] MRN: [id number] DOB: February 23, 1941 Today's Date: 06/23/2015    History of Present Illness 75 y.o. male with PMH of hypothyroidism, CVA, depression, mental retardation, schizophrenia, who presented with fall, altered mental status and slurred speech. 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    PT Comments    Patient progressing some this session with ambulation distance.  Still with poor balance and extremely high fall risk.  Will need SNF level PT at d/c.  Follow Up Recommendations  SNF;Supervision/Assistance - 24 hour     Equipment Recommendations  None recommended by PT    Recommendations for Other Services       Precautions / Restrictions Precautions Precautions: Fall Precaution Comments: pt moves very quickly so watch lines carefully. Has IV, O2. Wearing mitts.    Mobility  Bed Mobility Overal bed mobility: Needs Assistance Bed Mobility: Sit to Supine     Supine to sit: Supervision Sit to supine: Mod assist   General bed mobility comments: assist to get positioned in bed; has high flexion tone  Transfers Overall transfer level: Needs assistance Equipment used: 2 person hand held assist Transfers: Sit to/from Stand Sit to Stand: Mod assist;+2 physical assistance         General transfer comment: lifting assist from bed, pt needed help to come forward over feet; +2 A for safety   Ambulation/Gait Ambulation/Gait assistance: Mod assist;+2 safety/equipment Ambulation Distance (Feet): 50 Feet Assistive device: 2 person hand held assist Gait Pattern/deviations: Step-through pattern;Decreased stride length;Scissoring;Narrow base of support;Staggering left;Staggering right     General Gait Details: Amb on RA, SpO2 88%; replaced O2 at rest on 2LPM SpO2 93%; patient impulsive and turns to return to room in hallway without warning   Stairs             Wheelchair Mobility    Modified Rankin (Stroke Patients Only) Modified Rankin (Stroke Patients Only) Pre-Morbid Rankin Score: Moderately severe disability Modified Rankin: Moderately severe disability     Balance Overall balance assessment: Needs assistance         Standing balance support: Bilateral upper extremity supported Standing balance-Leahy Scale: Poor Standing balance comment: A with bilat UE support for balance                    Cognition Arousal/Alertness: Awake/alert Behavior During Therapy: Flat affect Overall Cognitive Status: No family/caregiver present to determine baseline cognitive functioning                      Exercises      General Comments        Pertinent Vitals/Pain Faces Pain Scale: No hurt    Home Living                      Prior Function            PT Goals (current goals can now be found in the care plan section) Progress towards PT goals: Progressing toward goals    Frequency  Min 2X/week    PT Plan Current plan remains appropriate    Co-evaluation             End of Session Equipment Utilized During Treatment: Gait belt Activity Tolerance: Patient tolerated treatment well Patient left: in bed;with call bell/phone within reach;with bed alarm set     Time: 1024-1040 PT Time Calculation (min) (ACUTE ONLY): 16 min  Charges:  $Gait Training: 8-22 mins                    G Codes:      Elray Mcgregor 07/19/2015, 11:16 AM  Sheran Lawless, PT [phone] Jul 02, 2015

## 2015-06-23 NOTE — Discharge Summary (Signed)
PATIENT DETAILS Name: Scott [name] Age: 75 y.o. Sex: male Date of Birth: 06-20-1940 MRN: [id number]. Admitting Physician: Lorretta Harp, MD PCP:No primary care provider on file.  Admit Date: 06/17/2015 Discharge date: 06/23/2015  Recommendations for Outpatient Follow-up:  1. Ensure hospice follow-up at [facility] further significant deterioration-consider transitioning to comfort measures.  2. Ensure speech therapy follow-up at SNF. 3. Goals of care are mostly for comfort-but family okay for gentle medical treatment. No aggressive/heroic measures. 4. DO NOT RESUSCITATE in place.  PRIMARY DISCHARGE DIAGNOSIS:  Principal Problem:   Stroke (cerebrum) (HCC) Active Problems:   Hypothyroid   Mental retardation   Schizophrenia, acute (HCC)   Depression   Fall   Stroke [facility])   Cerebrovascular accident (CVA) due to occlusion of right middle cerebral artery (HCC)   Abdominal distension   Gaseous abdominal distention   Palliative care encounter   SOB (shortness of breath)   Carotid stenosis      PAST MEDICAL HISTORY: Past Medical History  Diagnosis Date  . Hypothyroid   . Mental retardation   . Schizophrenia, acute (HCC)   . Depression   . Stroke (cerebrum) (HCC)     DISCHARGE MEDICATIONS: Current Discharge Medication List    START taking these medications   Details  !! LORazepam (ATIVAN) 0.5 MG tablet Take 1 tablet (0.5 mg total) by mouth daily. Qty: 30 tablet, Refills: 0    !! LORazepam (ATIVAN) 1 MG tablet Take 1 tablet (1 mg total) by mouth at bedtime. Qty: 30 tablet, Refills: 0    Morphine Sulfate (MORPHINE CONCENTRATE) 10 mg / 0.5 ml concentrated solution Place 0.25-0.5 mLs (5-10 mg total) under the tongue every 4 (four) hours as needed for moderate pain, severe pain, anxiety or shortness of breath. Qty: 30 mL, Refills: 0    senna-docusate (SENOKOT-S) 8.6-50 MG tablet Take 2 tablets by mouth at bedtime.     !! - Potential duplicate medications found. Please discuss  with provider.    CONTINUE these medications which have NOT CHANGED   Details  aspirin 325 MG tablet Take 325 mg by mouth daily.    benztropine (COGENTIN) 0.5 MG tablet Take 0.5 mg by mouth 2 (two) times daily.    gabapentin (NEURONTIN) 300 MG capsule Take 600 mg by mouth 3 (three) times daily.    levothyroxine (SYNTHROID, LEVOTHROID) 75 MCG tablet Take 75 mcg by mouth daily before breakfast.    Omega-3 Fatty Acids (FISH OIL) 1000 MG CAPS Take 1 capsule by mouth 2 (two) times daily.    PARoxetine (PAXIL) 20 MG tablet Take 20 mg by mouth daily.    !! risperiDONE (RISPERDAL) 1 MG tablet Take 1 mg by mouth every morning.    !! risperiDONE (RISPERDAL) 2 MG tablet Take 2 mg by mouth every evening.     !! - Potential duplicate medications found. Please discuss with provider.    STOP taking these medications     traMADol (ULTRAM) 50 MG tablet         ALLERGIES:  No Known Allergies  BRIEF HPI:  See H&P, Labs, Consult and Test reports for all details in brief, patient was admitted for dilation of fall along with altered mental status and slurred speech.  CONSULTATIONS:   neurology and Palliative care  PERTINENT RADIOLOGIC STUDIES: Ct Angio Head W/cm &/or Wo Cm  06/19/2015  CLINICAL DATA:  Chronic mental retardation.  Stroke. EXAM: CT ANGIOGRAPHY HEAD AND NECK TECHNIQUE: Multidetector CT imaging of the head and neck was performed using  the standard protocol during bolus administration of intravenous contrast. Multiplanar CT image reconstructions and MIPs were obtained to evaluate the vascular anatomy. Carotid stenosis measurements (when applicable) are obtained utilizing NASCET criteria, using the distal internal carotid diameter as the denominator. CONTRAST:  50mL OMNIPAQUE IOHEXOL 350 MG/ML SOLN COMPARISON:  MRI 06/18/2015.  CT 06/17/2015. FINDINGS: CT HEAD Low-density in the right frontal lobe consistent with the acute infarction in that region. No mass effect or shift. Old left  frontal cortical and subcortical infarction. No other focal insult visible. Widespread chronic small-vessel disease of the deep white matter. No hemorrhage, hydrocephalus or extra-axial collection. No skull fracture. Small amount of fluid in the sphenoid sinus. CTA NECK Aortic arch: Atherosclerosis of the arch but no aneurysm or dissection. Branching pattern of the brachiocephalic vessels is normal without origin stenosis. Right carotid system: Common carotid artery widely patent to the bifurcation. Mild atherosclerotic disease at the bifurcation but no stenosis or irregularity. Cervical internal carotid artery widely patent. Atherosclerotic calcification proximal to the skullbase with stenosis no more than 20%. Left carotid system: Common carotid artery widely patent to the bifurcation. Atherosclerotic disease affecting the distal common carotid artery with stenosis of 30% in that location. Atherosclerotic disease at the carotid bifurcation with severe stenosis at the distal bulb. Patent luminal diameter through that location measures 1 mm or less. The more distal cervical ICA shows reduced caliber because of reduced inflow. This likely represents a greater than 90% stenosis. Vertebral arteries:Both vertebral artery origins are widely patent with the right being dominant. Both vertebral arteries are widely patent through the cervical region. Skeleton: Advanced chronic cervical spondylosis.  No acute finding. Other neck: No other soft tissue lesion in the neck. The esophagus does appear to be thick walled. Layering pleural effusion on the right. CTA HEAD Anterior circulation: Both internal carotid arteries are patent through the siphon regions. There is considerable motion degradation in this region. Atherosclerotic calcification probably results in siphon narrowing on the left are approximately 50%. Anterior and middle cerebral vessels are patent without proximal stenosis, aneurysm or vascular malformation.  Posterior circulation: Both vertebral arteries are patent to the basilar. No basilar stenosis. Posterior circulation branch vessels are normal. Venous sinuses: Patent and normal Anatomic variants: None significant Delayed phase: No abnormal enhancement IMPRESSION: Low-density has now developed in the right frontal lobe in the region of acute infarction. No evidence of hemorrhage or mass effect. No right carotid bifurcation stenosis. Severe stenosis of the distal bulb on the left, 90% or greater, with diminished flow beyond that. No intracranial large or medium vessel occlusion or visible missing branches. Electronically Signed   By: Paulina Fusi M.D.   On: 06/19/2015 10:50   Ct Head Wo Contrast  06/17/2015  CLINICAL DATA:  Status post fall today.  Initial encounter. EXAM: CT HEAD WITHOUT CONTRAST TECHNIQUE: Contiguous axial images were obtained from the base of the skull through the vertex without intravenous contrast. COMPARISON:  None. FINDINGS: Contusion and laceration over right frontal bone are identified. There is no underlying fracture. The brain is atrophic with chronic microvascular ischemic change. Remote appearing left frontal infarct is noted. No acute intracranial abnormality including hemorrhage, infarct, mass lesion, mass effect, midline shift or abnormal extra-axial fluid collection. IMPRESSION: Contusion and laceration over the right side of the frontal bone. Negative for fracture or acute intracranial abnormality. Atrophy, chronic microvascular ischemic change and remote left frontal infarct. These results were called by telephone at the time of interpretation on 06/17/2015 at 8:30 pm to  Dr. Cathren Laine , who verbally acknowledged these results. Electronically Signed   By: Drusilla Kanner M.D.   On: 06/17/2015 20:30   Ct Angio Neck W/cm &/or Wo/cm  06/19/2015  CLINICAL DATA:  Chronic mental retardation.  Stroke. EXAM: CT ANGIOGRAPHY HEAD AND NECK TECHNIQUE: Multidetector CT imaging of the  head and neck was performed using the standard protocol during bolus administration of intravenous contrast. Multiplanar CT image reconstructions and MIPs were obtained to evaluate the vascular anatomy. Carotid stenosis measurements (when applicable) are obtained utilizing NASCET criteria, using the distal internal carotid diameter as the denominator. CONTRAST:  50mL OMNIPAQUE IOHEXOL 350 MG/ML SOLN COMPARISON:  MRI 06/18/2015.  CT 06/17/2015. FINDINGS: CT HEAD Low-density in the right frontal lobe consistent with the acute infarction in that region. No mass effect or shift. Old left frontal cortical and subcortical infarction. No other focal insult visible. Widespread chronic small-vessel disease of the deep white matter. No hemorrhage, hydrocephalus or extra-axial collection. No skull fracture. Small amount of fluid in the sphenoid sinus. CTA NECK Aortic arch: Atherosclerosis of the arch but no aneurysm or dissection. Branching pattern of the brachiocephalic vessels is normal without origin stenosis. Right carotid system: Common carotid artery widely patent to the bifurcation. Mild atherosclerotic disease at the bifurcation but no stenosis or irregularity. Cervical internal carotid artery widely patent. Atherosclerotic calcification proximal to the skullbase with stenosis no more than 20%. Left carotid system: Common carotid artery widely patent to the bifurcation. Atherosclerotic disease affecting the distal common carotid artery with stenosis of 30% in that location. Atherosclerotic disease at the carotid bifurcation with severe stenosis at the distal bulb. Patent luminal diameter through that location measures 1 mm or less. The more distal cervical ICA shows reduced caliber because of reduced inflow. This likely represents a greater than 90% stenosis. Vertebral arteries:Both vertebral artery origins are widely patent with the right being dominant. Both vertebral arteries are widely patent through the cervical  region. Skeleton: Advanced chronic cervical spondylosis.  No acute finding. Other neck: No other soft tissue lesion in the neck. The esophagus does appear to be thick walled. Layering pleural effusion on the right. CTA HEAD Anterior circulation: Both internal carotid arteries are patent through the siphon regions. There is considerable motion degradation in this region. Atherosclerotic calcification probably results in siphon narrowing on the left are approximately 50%. Anterior and middle cerebral vessels are patent without proximal stenosis, aneurysm or vascular malformation. Posterior circulation: Both vertebral arteries are patent to the basilar. No basilar stenosis. Posterior circulation branch vessels are normal. Venous sinuses: Patent and normal Anatomic variants: None significant Delayed phase: No abnormal enhancement IMPRESSION: Low-density has now developed in the right frontal lobe in the region of acute infarction. No evidence of hemorrhage or mass effect. No right carotid bifurcation stenosis. Severe stenosis of the distal bulb on the left, 90% or greater, with diminished flow beyond that. No intracranial large or medium vessel occlusion or visible missing branches. Electronically Signed   By: Paulina Fusi M.D.   On: 06/19/2015 10:50   Ct Cervical Spine Wo Contrast  06/18/2015  CLINICAL DATA:  Pain following fall EXAM: CT CERVICAL SPINE WITHOUT CONTRAST TECHNIQUE: Multidetector CT imaging of the cervical spine was performed without intravenous contrast. Multiplanar CT image reconstructions were also generated. COMPARISON:  None. FINDINGS: There is no demonstrable fracture or spondylolisthesis. The prevertebral soft tissues and predental space regions are normal. There is moderately severe disc space narrowing at C3-4, C4-5, C6-7, and C7-T1. There is multilevel facet  hypertrophy. There is exit foraminal narrowing due to bony hypertrophy at C3-4 bilaterally, C4-5 bilaterally, C5-6 bilaterally, and C6-7  bilaterally. There is mild amount moderate spinal stenosis at C3-4 and C4-5 due to bony hypertrophy. There are foci of carotid artery calcification, more on the left than on the right. There is also atherosclerotic calcification in the aortic arch. IMPRESSION: No apparent fracture or spondylolisthesis. Multifocal osteoarthritic change. Mild to moderate spinal stenosis at C3-4 and C4-5 due to bony hypertrophy. Bilateral carotid artery calcification. Electronically Signed   By: Bretta Bang III M.D.   On: 06/18/2015 10:04   Mr Laqueta Jean ZO Contrast  06/18/2015  CLINICAL DATA:  Initial evaluation for fall, altered mental status, slurred speech. History of schizophrenia, depression. EXAM: MRI HEAD WITHOUT AND WITH CONTRAST TECHNIQUE: Multiplanar, multiecho pulse sequences of the brain and surrounding structures were obtained without and with intravenous contrast. CONTRAST:  15mL MULTIHANCE GADOBENATE DIMEGLUMINE 529 MG/ML IV SOLN COMPARISON:  Prior CT from 06/17/2015. FINDINGS: Diffuse prominence of the CSF containing spaces is compatible with generalized atrophy, moderate for age. Patchy and confluent T2/FLAIR hyperintensity within the periventricular and deep white matter most consistent with chronic small vessel ischemic changes, also moderate in nature. Small vessel type changes present within the pons. Focal encephalomalacia within the anterior left frontal lobe compatible with remote infarct. Additional small remote bilateral cerebellar infarcts noted. Scatter remote lacunar infarcts involve the bilateral basal ganglia. Confluent restricted diffusion involving the anterior right operculum the with extension into the right insular cortex and subinsular white matter consistent with acute right MCA territory infarct. Additional patchy infarct within knee right basal ganglia/corona radiata out. It is patchy cortical infarcts more posteriorly within the right parietal and occipital lobes. Additional small  cortical infarct within the parasagittal left parietal lobe. There is gyral swelling and edema on T2/FLAIR sequences without significant mass effect. Mild associated petechial hemorrhage within the right parietal lobe (Series 7, image 18). No other evidence for hemorrhage. Abnormal flow void within the left ICA and MCA, which may reflect slow flow and/or atheromatous disease. The left ICA itself may be occluded, although the left MCA is felt to be at least partially patent. Remainder of the intracranial vascular flow voids are patent, although atheromatous disease noted within the vertebrobasilar system. No mass lesion, midline shift, or mass effect. Ventricular prominence related to global parenchymal volume loss without hydrocephalus. No extra-axial fluid collection. No abnormal enhancement following contrast administration. Craniocervical junction within normal limits. Multilevel degenerative spondylolysis noted within the upper cervical spine. There is apparent moderate to severe canal stenosis at the C3-4 level. Pituitary gland within normal limits. No acute abnormality about the orbits. Mild scattered mucosal thickening within the ethmoidal air cells. Minimal layering opacity within the right sphenoid sinus. No significant mastoid effusion. Inner ear structures grossly normal. Bone marrow signal intensity within normal limits. Right frontal scalp contusion present. IMPRESSION: 1. Patchy moderate sized multi focal right MCA territory ischemic infarcts as above. Additional small cortical ischemic infarct within the parasagittal left parietal lobe. 2. Remote left MCA territory infarct with additional remote lacunar infarcts involving the bilateral basal ganglia and cerebellar hemispheres. 3. Abnormal flow void within the left ICA and to a lesser extent left MCA, which may reflect slow flow and/or partial occlusion or occlusion. This is suspected to likely be chronic in nature given the remote left MCA territory  infarct. 4. Moderate atrophy with chronic small vessel ischemic disease. 5. Right frontal scalp contusion. Electronically Signed   By: Sharlet Salina  Phill Myron M.D.   On: 06/18/2015 22:12   Ct Abdomen Pelvis W Contrast  06/18/2015  CLINICAL DATA:  75 year old male with fall, abdominal pain and distention. EXAM: CT ABDOMEN AND PELVIS WITH CONTRAST TECHNIQUE: Multidetector CT imaging of the abdomen and pelvis was performed using the standard protocol following bolus administration of intravenous contrast. CONTRAST:  75mL OMNIPAQUE IOHEXOL 300 MG/ML  SOLN COMPARISON:  None. FINDINGS: Lower chest: A tiny right pleural effusion is noted with small bibasilar atelectasis. Cardiomegaly and coronary artery calcifications are present. Hepatobiliary: Multiple cysts are identified. Cholelithiasis is identified without CT evidence of acute cholecystitis. There is no evidence of biliary dilatation. Pancreas: Unremarkable Spleen: Unremarkable Adrenals/Urinary Tract: Mild bilateral renal cortical thinning noted. There is no evidence of renal mass, urinary calculi or hydronephrosis. Stomach/Bowel: The sigmoid colon and rectum are distended with gas and fluid, with the sigmoid colon measuring up to 15 cm in greatest transverse dimension. Mild to moderate distention of the remainder of the colon with stool and gas noted. No definite obstructing lesion identified. There is no evidence of small bowel dilatation. Appendix is normal. Vascular/Lymphatic: Aortic atherosclerotic calcifications noted without aneurysm. No enlarged lymph nodes are identified. Reproductive: Prostate calcifications noted. Other: No free fluid, pneumoperitoneum or abscess. Musculoskeletal: An acute to subacute appearing fracture of the posterior right seventh rib is noted. Fractures involving the sacrum bilaterally, bilateral medial pubic bones, left inferior pubic ramus, and right L5 transverse process are age indeterminate but appear subacute. Heterotopic  ossification adjacent to the left inferior pubic ramus fracture is noted. Remote appearing left rib fractures are present. IMPRESSION: Moderate to severe distention of the sigmoid colon and rectum with the sigmoid colon measuring up to 15 cm in greatest transverse dimension. Mild to moderate distention of the remainder of the colon containing stool and gas. No identifiable obstructing process. No evidence of pneumoperitoneum or small bowel distention. Acute to subacute appearing fracture of the posterior right seventh rib. Tiny right pleural effusion. Fractures involving the sacrum bilaterally, medial pubic bones bilaterally, left inferior pubic ramus and right L5 transverse process - age indeterminate but appears subacute. Correlate with pain in history. Cholelithiasis without CT evidence of acute cholecystitis. Electronically Signed   By: Harmon Pier M.D.   On: 06/18/2015 10:29   Dg Chest Port 1 View  06/21/2015  CLINICAL DATA:  Shortness breath. EXAM: PORTABLE CHEST 1 VIEW COMPARISON:  06/19/2015.  CT 06/18/2015. FINDINGS: Mediastinum hilar structures are stable. Cardiomegaly with mild bilateral pulmonary interstitial prominence suggesting mild congestive heart failure. Low lung volumes with basilar atelectasis. Colonic distention again noted. Colonic interposition under the left hemidiaphragm again noted . IMPRESSION: 1. Cardiomegaly with mild bilateral from interstitial prominence suggesting congestive heart failure. 2. Persistent colonic distention. Colonic interposition of the left hemidiaphragm again noted . Electronically Signed   By: Maisie Fus  Register   On: 06/21/2015 07:35   Dg Chest Port 1 View  06/19/2015  CLINICAL DATA:  Shortness of breath EXAM: PORTABLE CHEST 1 VIEW COMPARISON:  06/17/2015 FINDINGS: Stable heart size with central vascular congestion and basilar atelectasis evident. No definite edema pattern or CHF. No focal pneumonia, collapse or consolidation. No effusion or pneumothorax.  Trachea midline. Atherosclerosis of the aorta. Diffuse distention of the bowel in the epigastric region compatible with ileus. IMPRESSION: Increased vascular congestion and basilar atelectasis. Gaseous distention in the upper abdomen suspicious for ileus. Electronically Signed   By: Judie Petit.  Shick M.D.   On: 06/19/2015 09:00   Dg Chest Portable 1 View  06/17/2015  CLINICAL DATA:  Head trauma secondary to a fall today. Code stroke. Initial encounter. EXAM: PORTABLE CHEST 1 VIEW COMPARISON:  None. FINDINGS: The lungs are clear. Heart size is upper normal. No pneumothorax or pleural effusion. No focal bony abnormality. Gaseous distention of visualized bowel loops in the upper abdomen noted. IMPRESSION: No acute cardiopulmonary disease. Gaseous distention of visualized bowel loops is nonspecific but may be be due to ileus. Electronically Signed   By: Drusilla Kanner M.D.   On: 06/17/2015 20:10   Dg Abd 2 Views  06/19/2015  CLINICAL DATA:  Abdominal distention.  Unresponsive. EXAM: ABDOMEN - 2 VIEW COMPARISON:  CT 06/18/2015 FINDINGS: Severe gaseous distention of the colon. Large stool burden throughout the colon. Findings are similar to prior CT. Contrast material noted within the collecting systems of the kidneys bilaterally. No free air. IMPRESSION: Continued marked gaseous distention of the colon. Large stool burden. Electronically Signed   By: Charlett Nose M.D.   On: 06/19/2015 10:46     PERTINENT LAB RESULTS: CBC: No results for input(s): WBC, HGB, HCT, PLT in the last 72 hours. CMET CMP     Component Value Date/Time   NA 142 06/20/2015 1000   K 3.5 06/20/2015 1000   CL 110 06/20/2015 1000   CO2 25 06/20/2015 1000   GLUCOSE 127* 06/20/2015 1000   BUN 23* 06/20/2015 1000   CREATININE 0.58* 06/20/2015 1000   CALCIUM 8.5* 06/20/2015 1000   PROT 6.0* 06/17/2015 1932   ALBUMIN 3.3* 06/17/2015 1932   AST 20 06/17/2015 1932   ALT 12* 06/17/2015 1932   ALKPHOS 217* 06/17/2015 1932   BILITOT 0.8  06/17/2015 1932   GFRNONAA >60 06/20/2015 1000   GFRAA >60 06/20/2015 1000    GFR Estimated Creatinine Clearance: 82.4 mL/min (by C-G formula based on Cr of 0.58). No results for input(s): LIPASE, AMYLASE in the last 72 hours. No results for input(s): CKTOTAL, CKMB, CKMBINDEX, TROPONINI in the last 72 hours. Invalid input(s): POCBNP No results for input(s): DDIMER in the last 72 hours. No results for input(s): HGBA1C in the last 72 hours. No results for input(s): CHOL, HDL, LDLCALC, TRIG, CHOLHDL, LDLDIRECT in the last 72 hours. No results for input(s): TSH, T4TOTAL, T3FREE, THYROIDAB in the last 72 hours.  Invalid input(s): FREET3 No results for input(s): VITAMINB12, FOLATE, FERRITIN, TIBC, IRON, RETICCTPCT in the last 72 hours. Coags: No results for input(s): INR in the last 72 hours.  Invalid input(s): PT Microbiology: No results found for this or any previous visit (from the past 240 hour(s)).   BRIEF HOSPITAL COURSE:  Brief narrative:  75 year old male with history of paranoid schizophrenia/mental retardation-prior history of CVA-was admitted to this facility with complaints of frequent falls, altered mental status and slurred speech. Further evaluation revealed acute CVA. Hospital course was complicated by development of significant dysphagia, aspiration pneumonia and respiratory failure. Patient was treated with IV Unasyn, and other supportive medications. He has somewhat improved, after multiple discussion with family-DO NOT RESUSCITATE in place, family wants to focus mostly on comfort, but okay with gentle medical treatment. Given his overall clinical condition-family realizes that patient is a poor candidate for aggressive medical care. Being discharged to SNF with hospice   Hospital course by problem list: Acute hypoxic respiratory failure: In the setting of CVA, dysphagia, and now aspiration pneumonia.Better control of oral secretions with IV Robinul/ Atropine/Unasyn.  Initially required 5-6 L of oxygen-now tapered down to 2-3 L .Continue O2  titrate down if able.   Active  Problems: Acute multifocal right MCA territory and left parasagittal parietal lobe ischemic infarct: Does have some mild left-sided weakness-but exam is very unreliable.CTA neck shows 90% or greater stenosis of the left ICA. Carotid Doppler showed >80% left internal carotid artery stenosis. Echocardiogram showed preserved ejection fraction without any embolic source.LDL 66, A1C 6.1. Doubt given overall clinical scenario patient a candidate for CEA-Neurology agrees.   Dysphagia: Secondary to above.Better control of oral secretions with Robinul and Atropine.SLP evaluation complete-on Dys 1 diet-family ok/aware of aspiration risk.They want to focus mostly on comfort.  Presumed aspiration pneumonia: Completed 5 days of IV Unasyn, clinically improved. Suspect does not require any further antibiotics on discharge.   Distended rectum/sigmoid colon: Secondary to colonic dysmotility-per GI this is commonly seen in institutionalized psychiatry patient's. Do not feel that this is clinically relevant-especially given above noted issues. Briefly had a rectal tube placed-which has since been discontinued. Abdomen is soft with very minimal distention.  Hypothyroidism: Continue Synthroid  History of mental retardation/schizophrenia: Initially all oral medications were held-now resumed. Some agitation during this hospital stay-well managed with Ativan.  Fractures involving the sacrum bilaterally, medial pubic bones bilaterally, left inferior pubic ramus and right L5 transverse process: Age indeterminate but most likely subacute-per radiology report.Suspect secondary to numerous recent falls. Supportive care only at this time.   Laceration right forehead: Sutured in the emergency room,tDap x 1. Please remove sutures in 1 week.  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 Although improved -with better control of secretions-is still requiring oxygen and remains at risk of further worsening. Given overall clinical picture-he remains with poor overall prognoses.  Family wanting to focus on comfort-but willing to continue with gentle treatment. Being discharged to SNF with hospice follow-up, if deteriorates-best served by initiating full comfort measures at SNF or transferring to residential hospice.  TODAY-DAY OF DISCHARGE:  Subjective:   Donavyn Fecher today has a sleeping-but awake and at times alert. Speaks in 1 word sentences. Not in any distress.  Objective:   Blood pressure 143/92, pulse 87, temperature 97.7 F (36.5 C), temperature source Oral, resp. rate 17, height 5\' 10"  (1.778 m), weight 77.111 kg (170 lb), SpO2 92 %.  Intake/Output Summary (Last 24 hours) at 06/23/15 1034 Last data filed at 06/23/15 0851  Gross per 24 hour  Intake     60 ml  Output      0 ml  Net     60 ml   Filed Weights   06/17/15 1935  Weight: 77.111 kg (170 lb)    Exam Awake-No new F.N deficits, Normal affect. Not in any distress [place].AT,PERRAL Supple Neck,No JVD, No cervical lymphadenopathy appriciated.  Symmetrical Chest wall movement, Good air movement bilaterally, CTAB RRR,No Gallops,Rubs or new Murmurs, No Parasternal Heave +ve B.Sounds, Abd Soft, Non tender, No organomegaly appriciated, No rebound -guarding or rigidity. No Cyanosis, Clubbing or edema, No new Rash or bruise  DISCHARGE CONDITION: Stable  DISPOSITION: SNF  DISCHARGE INSTRUCTIONS:    Activity:  As tolerated with Full fall precautions use walker/cane & assistance as needed  Get Medicines reviewed and adjusted: Please take all your medications with you for your next visit with your Primary MD  Please request your Primary MD to go over all hospital tests and procedure/radiological results  at the follow up, please ask your Primary MD to get all Hospital records sent to his/her office.  If you experience worsening of your admission  symptoms, develop shortness of breath, life threatening emergency, suicidal or homicidal thoughts you must seek medical attention immediately by calling 911 or calling your MD immediately  if symptoms less severe.  You must read complete instructions/literature along with all the possible adverse reactions/side effects for all the Medicines you take and that have been prescribed to you. Take any new Medicines after you have completely understood and accpet all the possible adverse reactions/side effects.   Do not drive when taking Pain medications.   Do not take more than prescribed Pain, Sleep and Anxiety Medications  Special Instructions: If you have smoked or chewed Tobacco  in the last 2 yrs please stop smoking, stop any regular Alcohol  and or any Recreational drug use.  Wear Seat belts while driving.  Please note  You were cared for by a hospitalist during your hospital stay. Once you are discharged, your primary care physician will handle any further medical issues. Please note that NO REFILLS for any discharge medications will be authorized once you are discharged, as it is imperative that you return to your primary care physician (or establish a relationship with a primary care physician if you do not have one) for your aftercare needs so that they can reassess your need for medications and monitor your lab values.   Diet recommendation: See below  Discharge Instructions    Diet - low sodium heart healthy    Complete by:  As directed   Diet recommendations: Dysphagia 1 (puree);Nectar-thick liquid Liquids provided via: Cup Medication Administration: Crushed with puree Supervision: Patient able to self feed;Staff to assist with self feeding Compensations: Minimize environmental distractions;Slow rate;Small sips/bites;Follow solids with  liquid Postural Changes and/or Swallow Maneuvers: Seated upright 90 degrees     Increase activity slowly    Complete by:  As directed            Follow-up Information    Follow up with [facility].   Specialty:  Skilled Nursing Facility   Contact information:   [address] [place] [address] [phone]     Total Time spent on discharge equals 45 minutes.  SignedJeoffrey Massed 06/23/2015 10:34 AM

## 2015-06-23 NOTE — Progress Notes (Signed)
Patient fell, no signs or injury or distress. Physician notified, no new orders will continue to monitor.

## 2015-06-24 ENCOUNTER — Non-Acute Institutional Stay (SKILLED_NURSING_FACILITY): Payer: Medicare Other | Admitting: Internal Medicine

## 2015-06-24 DIAGNOSIS — S3210XD Unspecified fracture of sacrum, subsequent encounter for fracture with routine healing: Secondary | ICD-10-CM | POA: Diagnosis not present

## 2015-06-24 DIAGNOSIS — F200 Paranoid schizophrenia: Secondary | ICD-10-CM

## 2015-06-24 DIAGNOSIS — R627 Adult failure to thrive: Secondary | ICD-10-CM | POA: Diagnosis not present

## 2015-06-24 DIAGNOSIS — S32509D Unspecified fracture of unspecified pubis, subsequent encounter for fracture with routine healing: Secondary | ICD-10-CM

## 2015-06-24 DIAGNOSIS — R739 Hyperglycemia, unspecified: Secondary | ICD-10-CM

## 2015-06-24 DIAGNOSIS — N3942 Incontinence without sensory awareness: Secondary | ICD-10-CM

## 2015-06-24 DIAGNOSIS — I63511 Cerebral infarction due to unspecified occlusion or stenosis of right middle cerebral artery: Secondary | ICD-10-CM | POA: Diagnosis not present

## 2015-06-24 DIAGNOSIS — S0181XA Laceration without foreign body of other part of head, initial encounter: Secondary | ICD-10-CM | POA: Diagnosis not present

## 2015-06-24 DIAGNOSIS — F32A Depression, unspecified: Secondary | ICD-10-CM

## 2015-06-24 DIAGNOSIS — F329 Major depressive disorder, single episode, unspecified: Secondary | ICD-10-CM

## 2015-06-25 ENCOUNTER — Emergency Department (HOSPITAL_COMMUNITY): Payer: Medicare Other

## 2015-06-25 ENCOUNTER — Encounter (HOSPITAL_COMMUNITY): Payer: Self-pay

## 2015-06-25 ENCOUNTER — Encounter: Payer: Self-pay | Admitting: Physical Medicine & Rehabilitation

## 2015-06-25 ENCOUNTER — Emergency Department (HOSPITAL_COMMUNITY)
Admission: EM | Admit: 2015-06-25 | Discharge: 2015-06-26 | Disposition: A | Discharge status: Home / Self Care | Payer: Medicare Other | Attending: Emergency Medicine | Admitting: Emergency Medicine

## 2015-06-25 DIAGNOSIS — Z7982 Long term (current) use of aspirin: Secondary | ICD-10-CM | POA: Diagnosis not present

## 2015-06-25 DIAGNOSIS — Y998 Other external cause status: Secondary | ICD-10-CM | POA: Diagnosis not present

## 2015-06-25 DIAGNOSIS — N390 Urinary tract infection, site not specified: Secondary | ICD-10-CM | POA: Diagnosis not present

## 2015-06-25 DIAGNOSIS — Z79899 Other long term (current) drug therapy: Secondary | ICD-10-CM | POA: Insufficient documentation

## 2015-06-25 DIAGNOSIS — W1839XA Other fall on same level, initial encounter: Secondary | ICD-10-CM | POA: Insufficient documentation

## 2015-06-25 DIAGNOSIS — E039 Hypothyroidism, unspecified: Secondary | ICD-10-CM | POA: Insufficient documentation

## 2015-06-25 DIAGNOSIS — Z8673 Personal history of transient ischemic attack (TIA), and cerebral infarction without residual deficits: Secondary | ICD-10-CM | POA: Insufficient documentation

## 2015-06-25 DIAGNOSIS — S29001A Unspecified injury of muscle and tendon of front wall of thorax, initial encounter: Secondary | ICD-10-CM | POA: Diagnosis not present

## 2015-06-25 DIAGNOSIS — F200 Paranoid schizophrenia: Secondary | ICD-10-CM | POA: Insufficient documentation

## 2015-06-25 DIAGNOSIS — S0181XA Laceration without foreign body of other part of head, initial encounter: Secondary | ICD-10-CM | POA: Diagnosis not present

## 2015-06-25 DIAGNOSIS — Y9389 Activity, other specified: Secondary | ICD-10-CM | POA: Diagnosis not present

## 2015-06-25 DIAGNOSIS — W19XXXA Unspecified fall, initial encounter: Secondary | ICD-10-CM

## 2015-06-25 DIAGNOSIS — Y9289 Other specified places as the place of occurrence of the external cause: Secondary | ICD-10-CM | POA: Insufficient documentation

## 2015-06-25 DIAGNOSIS — F329 Major depressive disorder, single episode, unspecified: Secondary | ICD-10-CM | POA: Insufficient documentation

## 2015-06-25 LAB — COMPREHENSIVE METABOLIC PANEL
ALBUMIN: 3.1 g/dL — AB (ref 3.5–5.0)
ALT: 35 U/L (ref 17–63)
ANION GAP: 12 (ref 5–15)
AST: 49 U/L — ABNORMAL HIGH (ref 15–41)
Alkaline Phosphatase: 136 U/L — ABNORMAL HIGH (ref 38–126)
BUN: 26 mg/dL — ABNORMAL HIGH (ref 6–20)
CO2: 27 mmol/L (ref 22–32)
Calcium: 8.7 mg/dL — ABNORMAL LOW (ref 8.9–10.3)
Chloride: 106 mmol/L (ref 101–111)
Creatinine, Ser: 0.88 mg/dL (ref 0.61–1.24)
GFR calc Af Amer: >60 mL/min (ref >60)
GFR calc non Af Amer: >60 mL/min (ref >60)
GLUCOSE: 169 mg/dL — AB (ref 65–99)
POTASSIUM: 3.2 mmol/L — AB (ref 3.5–5.1)
Sodium: 145 mmol/L (ref 135–145)
Total Bilirubin: 0.8 mg/dL (ref 0.3–1.2)
Total Protein: 6.2 g/dL — ABNORMAL LOW (ref 6.5–8.1)

## 2015-06-25 LAB — CBC WITH DIFFERENTIAL/PLATELET
BASOS ABS: 0 10*3/uL (ref 0.0–0.1)
Basophils Relative: 0 %
Eosinophils Absolute: 0.1 10*3/uL (ref 0.0–0.7)
Eosinophils Relative: 1 %
HEMATOCRIT: 36.4 % — AB (ref 39.0–52.0)
Hemoglobin: 12.1 g/dL — ABNORMAL LOW (ref 13.0–17.0)
LYMPHS PCT: 5 %
Lymphs Abs: 0.6 10*3/uL — ABNORMAL LOW (ref 0.7–4.0)
MCH: 31 pg (ref 26.0–34.0)
MCHC: 33.2 g/dL (ref 30.0–36.0)
MCV: 93.3 fL (ref 78.0–100.0)
MONO ABS: 1.1 10*3/uL — AB (ref 0.1–1.0)
Monocytes Relative: 9 %
NEUTROS ABS: 10.7 10*3/uL — AB (ref 1.7–7.7)
Neutrophils Relative %: 85 %
Platelets: 335 10*3/uL (ref 150–400)
RBC: 3.9 MIL/uL — AB (ref 4.22–5.81)
RDW: 13.1 % (ref 11.5–15.5)
WBC: 12.5 10*3/uL — AB (ref 4.0–10.5)

## 2015-06-25 LAB — I-STAT VENOUS BLOOD GAS, ED
Acid-Base Excess: 5 mmol/L — ABNORMAL HIGH (ref 0.0–2.0)
BICARBONATE: 29.5 meq/L — AB (ref 20.0–24.0)
O2 SAT: 95 %
TCO2: 31 mmol/L (ref 0–100)
pCO2, Ven: 42.9 mmHg — ABNORMAL LOW (ref 45.0–50.0)
pH, Ven: 7.446 — ABNORMAL HIGH (ref 7.250–7.300)
pO2, Ven: 75 mmHg — ABNORMAL HIGH (ref 30.0–45.0)

## 2015-06-25 LAB — I-STAT CG4 LACTIC ACID, ED: LACTIC ACID, VENOUS: 1.88 mmol/L (ref 0.5–2.0)

## 2015-06-25 MED ORDER — LIDOCAINE-EPINEPHRINE 1 %-1:100000 IJ SOLN
20.0000 mL | Freq: Once | INTRAMUSCULAR | Status: AC
  Administered 2015-06-25: 20 mL via INTRADERMAL
  Filled 2015-06-25: qty 1

## 2015-06-25 MED ORDER — SODIUM CHLORIDE 0.9 % IV BOLUS (SEPSIS)
1000.0000 mL | Freq: Once | INTRAVENOUS | Status: AC
Start: 2015-06-25 — End: 2015-06-26
  Administered 2015-06-25: 1000 mL via INTRAVENOUS

## 2015-06-25 NOTE — ED Provider Notes (Signed)
CSN: [id number]     Arrival date & time 06/25/15  2028 History   First MD Initiated Contact with Patient 06/25/15 2036     Chief Complaint  Patient presents with  . Fall     (Consider location/radiation/quality/duration/timing/severity/associated sxs/prior Treatment) Patient is a 75 y.o. male presenting with fall. The history is provided by the patient.  Fall This is a recurrent problem. The current episode started today. The problem occurs every several days. The problem has been unchanged. Associated symptoms include chest pain. Pertinent negatives include no abdominal pain, coughing, neck pain or visual change. Nothing aggravates the symptoms. He has tried nothing for the symptoms. The treatment provided no relief.    Past Medical History  Diagnosis Date  . Paranoid schizophrenia (HCC)   . Stroke (HCC)   . Hypothyroid   . Mental retardation   . Schizophrenia, acute (HCC)   . Depression   . Stroke (cerebrum) [facility])    History reviewed. No pertinent past surgical history. No family history on file. Social History  Substance Use Topics  . Smoking status: Unknown If Ever Smoked  . Smokeless tobacco: None  . Alcohol Use: None    Review of Systems  Unable to perform ROS: Patient nonverbal  Respiratory: Negative for cough.   Cardiovascular: Positive for chest pain.  Gastrointestinal: Negative for abdominal pain.  Musculoskeletal: Negative for neck pain.      Allergies  Review of patient's allergies indicates no known allergies.  Home Medications   Prior to Admission medications   Medication Sig Start Date End Date Taking? Authorizing Provider  aspirin 325 MG EC tablet Take 1 tablet (325 mg total) by mouth daily. 08/21/13  Yes Daniel J Angiulli, PA-C  benztropine (COGENTIN) 0.5 MG tablet Take 1 tablet (0.5 mg total) by mouth 2 (two) times daily. 08/21/13  Yes Daniel J Angiulli, PA-C  gabapentin (NEURONTIN) 300 MG capsule Take 2 capsules (600 mg total) by mouth 3 (three)  times daily. 08/21/13  Yes Daniel J Angiulli, PA-C  levothyroxine (SYNTHROID, LEVOTHROID) 75 MCG tablet Take 1 tablet (75 mcg total) by mouth daily before breakfast. 08/21/13  Yes Daniel J Angiulli, PA-C  LORazepam (ATIVAN) 0.5 MG tablet Take 1 tablet (0.5 mg total) by mouth daily. 06/23/15  Yes Shanker Levora Dredge, MD  LORazepam (ATIVAN) 1 MG tablet Take 1 tablet (1 mg total) by mouth at bedtime. 06/23/15  Yes Shanker Levora Dredge, MD  Morphine Sulfate (MORPHINE CONCENTRATE) 10 mg / 0.5 ml concentrated solution Place 0.25-0.5 mLs (5-10 mg total) under the tongue every 4 (four) hours as needed for moderate pain, severe pain, anxiety or shortness of breath. 06/23/15  Yes Shanker Levora Dredge, MD  Omega-3 Fatty Acids (FISH OIL) 1000 MG CAPS Take 1 capsule by mouth 2 (two) times daily.   Yes Historical Provider, MD  PARoxetine (PAXIL) 20 MG tablet Take 1 tablet (20 mg total) by mouth daily. 08/21/13  Yes Daniel J Angiulli, PA-C  risperiDONE (RISPERDAL) 1 MG tablet Take 1 mg by mouth every morning.   Yes Historical Provider, MD  risperiDONE (RISPERDAL) 2 MG tablet Take 2 mg by mouth at bedtime.   Yes Historical Provider, MD  senna-docusate (SENOKOT-S) 8.6-50 MG tablet Take 2 tablets by mouth at bedtime. 06/23/15  Yes Shanker Levora Dredge, MD   BP 151/94 mmHg  Pulse 92  Temp(Src) 98.9 F (37.2 C) (Rectal)  Resp 18  SpO2 99% Physical Exam  Constitutional: Vital signs are normal. He appears ill (chronically).  HENT:  Head:  Normocephalic. Head is with laceration (large complex laceration through significant amount of deep tissue).    Eyes: EOM are normal. Pupils are equal, round, and reactive to light.  Neck: Normal range of motion. Neck supple.  Cardiovascular: Normal rate, regular rhythm, normal heart sounds and intact distal pulses.   Pulmonary/Chest: Effort normal and breath sounds normal. No respiratory distress.  Abdominal: Soft. He exhibits no distension. There is no tenderness. There is no rebound and no guarding.   Musculoskeletal: Normal range of motion. He exhibits no edema or tenderness.  Neurological: He is alert. He has normal strength. No cranial nerve deficit or sensory deficit. He displays no seizure activity. GCS eye subscore is 4. GCS verbal subscore is 4. GCS motor subscore is 5.  Skin: Skin is warm and dry.  Nursing note and vitals reviewed.   ED Course  .Marland KitchenLaceration Repair Date/Time: 06/26/2015 12:54 AM Performed by: Marijean Niemann Authorized by: Blane Ohara Consent: The procedure was performed in an emergent situation. Verbal consent not obtained. Written consent not obtained. Consent given by: patient Patient identity confirmed: arm band Time out: Immediately prior to procedure a "time out" was called to verify the correct patient, procedure, equipment, support staff and site/side marked as required. Body area: head/neck Location details: forehead Wound length (cm): jagged multi-directional 5cm  Foreign bodies: no foreign bodies Tendon involvement: none Nerve involvement: none Vascular damage: no Anesthesia: local infiltration Local anesthetic: lidocaine 1% with epinephrine Anesthetic total: 5 ml Patient sedated: no Preparation: Patient was prepped and draped in the usual sterile fashion. Irrigation solution: saline Irrigation method: syringe Amount of cleaning: standard Debridement: minimal Degree of undermining: extensive Wound skin closure material used: 4-0 ethilon. Number of sutures: 9 Technique: complex Approximation: loose Approximation difficulty: complex Dressing: antibiotic ointment and 4x4 sterile gauze Patient tolerance: Patient tolerated the procedure well with no immediate complications   (including critical care time) Labs Review Labs Reviewed  COMPREHENSIVE METABOLIC PANEL - Abnormal; Notable for the following:    Potassium 3.2 (*)    Glucose, Bld 169 (*)    BUN 26 (*)    Calcium 8.7 (*)    Total Protein 6.2 (*)    Albumin 3.1 (*)    AST 49 (*)     Alkaline Phosphatase 136 (*)    All other components within normal limits  CBC WITH DIFFERENTIAL/PLATELET - Abnormal; Notable for the following:    WBC 12.5 (*)    RBC 3.90 (*)    Hemoglobin 12.1 (*)    HCT 36.4 (*)    Neutro Abs 10.7 (*)    Lymphs Abs 0.6 (*)    Monocytes Absolute 1.1 (*)    All other components within normal limits  URINALYSIS, ROUTINE W REFLEX MICROSCOPIC (NOT AT [facility]) - Abnormal; Notable for the following:    APPearance CLOUDY (*)    Hgb urine dipstick SMALL (*)    Ketones, ur 15 (*)    Leukocytes, UA SMALL (*)    All other components within normal limits  URINE MICROSCOPIC-ADD ON - Abnormal; Notable for the following:    Squamous Epithelial / LPF 0-5 (*)    Bacteria, UA MANY (*)    All other components within normal limits  I-STAT VENOUS BLOOD GAS, ED - Abnormal; Notable for the following:    pH, Ven 7.446 (*)    pCO2, Ven 42.9 (*)    pO2, Ven 75.0 (*)    Bicarbonate 29.5 (*)    Acid-Base Excess 5.0 (*)    All  other components within normal limits  GRAM STAIN  CULTURE, BLOOD (ROUTINE X 2)  CULTURE, BLOOD (ROUTINE X 2)  URINE CULTURE  I-STAT CG4 LACTIC ACID, ED  I-STAT CG4 LACTIC ACID, ED    Imaging Review Ct Head Wo Contrast  06/26/2015  CLINICAL DATA:  Unwitnessed fall with right eye laceration. Initial encounter. EXAM: CT HEAD WITHOUT CONTRAST CT CERVICAL SPINE WITHOUT CONTRAST TECHNIQUE: Multidetector CT imaging of the head and cervical spine was performed following the standard protocol without intravenous contrast. Multiplanar CT image reconstructions of the cervical spine were also generated. COMPARISON:  06/18/2015 brain MRI. Cervical spine and head CT 06/18/2015 FINDINGS: CT HEAD FINDINGS Skull and Sinuses:There is a large laceration in the right forehead with gas extending to bone. No opaque foreign body or fracture. Visualized orbits: Negative. Brain: No posttraumatic findings such as hemorrhage or swelling. There is a indistinct low-density of  right insula and anterior frontal cortex and subcortical white matter consistent with infarct seen January 2017. Smaller infarcts seen on comparison brain MRI are not well visualized. There is a more remote, similar distribution infarct in the left frontal lobe. Chronic small-vessel disease with cerebral white matter ischemic gliosis that is diffuse. CT CERVICAL SPINE FINDINGS Due to patient's mental status/dementia there is unavoidable motion, which primarily affected the C4 to C7 vertebrae. There is no indication of fracture or traumatic malalignment. Diffuse disc narrowing and facet spurring. No gross canal hematoma or prevertebral edema. IMPRESSION: 1. Right forehead laceration without intracranial hemorrhage or fracture. 2. Motion degraded cervical CT which could obscure pathology. No evidence of cervical spine injury. 3. Bilateral frontal infarcts, late subacute on the right and chronic on the left. Electronically Signed   By: Marnee Spring M.D.   On: 06/26/2015 00:16   Ct Cervical Spine Wo Contrast  06/26/2015  CLINICAL DATA:  Unwitnessed fall with right eye laceration. Initial encounter. EXAM: CT HEAD WITHOUT CONTRAST CT CERVICAL SPINE WITHOUT CONTRAST TECHNIQUE: Multidetector CT imaging of the head and cervical spine was performed following the standard protocol without intravenous contrast. Multiplanar CT image reconstructions of the cervical spine were also generated. COMPARISON:  06/18/2015 brain MRI. Cervical spine and head CT 06/18/2015 FINDINGS: CT HEAD FINDINGS Skull and Sinuses:There is a large laceration in the right forehead with gas extending to bone. No opaque foreign body or fracture. Visualized orbits: Negative. Brain: No posttraumatic findings such as hemorrhage or swelling. There is a indistinct low-density of right insula and anterior frontal cortex and subcortical white matter consistent with infarct seen January 2017. Smaller infarcts seen on comparison brain MRI are not well  visualized. There is a more remote, similar distribution infarct in the left frontal lobe. Chronic small-vessel disease with cerebral white matter ischemic gliosis that is diffuse. CT CERVICAL SPINE FINDINGS Due to patient's mental status/dementia there is unavoidable motion, which primarily affected the C4 to C7 vertebrae. There is no indication of fracture or traumatic malalignment. Diffuse disc narrowing and facet spurring. No gross canal hematoma or prevertebral edema. IMPRESSION: 1. Right forehead laceration without intracranial hemorrhage or fracture. 2. Motion degraded cervical CT which could obscure pathology. No evidence of cervical spine injury. 3. Bilateral frontal infarcts, late subacute on the right and chronic on the left. Electronically Signed   By: Marnee Spring M.D.   On: 06/26/2015 00:16   Dg Chest Port 1 View  06/25/2015  CLINICAL DATA:  Patient with stroke status post fall. EXAM: PORTABLE CHEST 1 VIEW COMPARISON:  Chest radiograph 08/11/2013 FINDINGS: Low lung volumes.  Stable cardiac and mediastinal contours. Multiple monitoring leads overlie the patient. Heterogeneous opacities right lung base. No pleural effusion or pneumothorax. There is colonic interposition with marked colonic distention. IMPRESSION: Low lung volumes with heterogeneous opacities right lung base favored to represent atelectasis. Infection not excluded. Colonic interposition and dilated colon likely secondary to ileus. Given the overall appearance, if there is concern for acute pathology, consider correlation with abdominal radiographs and decubitus views to exclude the possibility of free intraperitoneal air or obstructive process. Electronically Signed   By: Annia Belt M.D.   On: 06/25/2015 22:46   I have personally reviewed and evaluated these images and lab results as part of my medical decision-making.   EKG Interpretation   Date/Time:  Friday June 25 2015 20:40:30 EST Ventricular Rate:  93 PR Interval:   134 QRS Duration: 89 QT Interval:  377 QTC Calculation: 469 R Axis:   69 Text Interpretation:  Sinus rhythm Abnormal R-wave progression, early  transition Inferior infarct, age indeterminate ED PHYSICIAN INTERPRETATION  AVAILABLE IN [facility] Confirmed by TEST, Record ([id number]) on 06/26/2015  8:51:44 AM      MDM   Final diagnoses:  Fall, initial encounter  Urinary tract infection without hematuria, site unspecified    Patient is a 75 year old male with a history of mental retardation, schizophrenia, and recently discharged after being admitted for a fall and CVA who presents from his SNF today after another unwitnessed fall resulting in the significant right forehead laceration. Otherwise reassuring vital signs and no obvious injuries on exam. CT head and C-spine obtained without evidence of intracranial injuries. His last tetanus shot was this month when he was last admitted. He is at his baseline without any obvious neuro deficits. Given concern for possible fever with ems, blood work obtained with mild leukocytosis and ua with many bacteria and leuks. Pt unable to give history. Will treat with a dose of fosfomycin. Wound cleansed and repaired as above loosely 2/2 significant tissue trauma and the fact that this wound has been reopened now for a 3rd time.   I have reviewed all labs and imaging. Patient stable for discharge back to snf.   Marijean Niemann, MD 06/26/15 1018  Blane Ohara, MD 06/28/15 [phone]

## 2015-06-25 NOTE — ED Notes (Addendum)
Pt placed on bp cuff, heart monitor, pulse ox, yellow fall socks and fall bracelet. Pt already had DNR bracelet on.

## 2015-06-25 NOTE — ED Notes (Signed)
During hourly rounding came to find that pt had ripped his IV out. This Clinical research associate and RN bedside to clean up pt, reattach oxygen tubing (also taken off) and notify MD- Room air sats 87%, placed pt back on 4L 02. Resident MD [place] bedside.

## 2015-06-25 NOTE — ED Notes (Signed)
Per EMS pt was recently discharged from stroke and was at rehab at [facility]; Pt had fall while admitted in hospital a few days ago and sustained head lac; Pt had another unwittnessed fall at facility today and reopened sutured lac of head; Pt obeys simple commands but very little verbal communication; Pt is at known baseline per EMS no mental status changes; Pt has hx of MR and schizophrenia; Pt has DNR; Pt unable to state pain level but appears call and has slight moans at times;

## 2015-06-25 NOTE — ED Notes (Signed)
MD at bedside. 

## 2015-06-26 DIAGNOSIS — S29001A Unspecified injury of muscle and tendon of front wall of thorax, initial encounter: Secondary | ICD-10-CM | POA: Diagnosis not present

## 2015-06-26 LAB — URINALYSIS, ROUTINE W REFLEX MICROSCOPIC
Bilirubin Urine: NEGATIVE
GLUCOSE, UA: NEGATIVE mg/dL
KETONES UR: 15 mg/dL — AB
Nitrite: NEGATIVE
PROTEIN: NEGATIVE mg/dL
Specific Gravity, Urine: 1.017 (ref 1.005–1.030)
pH: 7 (ref 5.0–8.0)

## 2015-06-26 LAB — URINE MICROSCOPIC-ADD ON

## 2015-06-26 LAB — GRAM STAIN: Special Requests: NORMAL

## 2015-06-26 MED ORDER — FOSFOMYCIN TROMETHAMINE 3 G PO PACK
3.0000 g | PACK | Freq: Once | ORAL | Status: AC
  Administered 2015-06-26: 3 g via ORAL
  Filled 2015-06-26: qty 3

## 2015-06-26 NOTE — ED Notes (Signed)
Staff sitter bedside with pt due to pt not staying in bed and removing cords.

## 2015-06-26 NOTE — ED Notes (Signed)
[facility] contacted to transport patient to [facility] 

## 2015-06-26 NOTE — ED Notes (Signed)
Pt placed on Posey alarm for safety; Pt continues to try to get out of bed without assistance

## 2015-06-28 ENCOUNTER — Encounter: Payer: Self-pay | Admitting: Internal Medicine

## 2015-06-28 ENCOUNTER — Non-Acute Institutional Stay (SKILLED_NURSING_FACILITY): Payer: Medicare Other | Admitting: Internal Medicine

## 2015-06-28 DIAGNOSIS — F329 Major depressive disorder, single episode, unspecified: Secondary | ICD-10-CM

## 2015-06-28 DIAGNOSIS — F200 Paranoid schizophrenia: Secondary | ICD-10-CM | POA: Diagnosis not present

## 2015-06-28 DIAGNOSIS — Z9181 History of falling: Secondary | ICD-10-CM

## 2015-06-28 DIAGNOSIS — R739 Hyperglycemia, unspecified: Secondary | ICD-10-CM

## 2015-06-28 DIAGNOSIS — R32 Unspecified urinary incontinence: Secondary | ICD-10-CM

## 2015-06-28 DIAGNOSIS — F32A Depression, unspecified: Secondary | ICD-10-CM

## 2015-06-28 DIAGNOSIS — K8020 Calculus of gallbladder without cholecystitis without obstruction: Secondary | ICD-10-CM | POA: Insufficient documentation

## 2015-06-28 DIAGNOSIS — S3210XA Unspecified fracture of sacrum, initial encounter for closed fracture: Secondary | ICD-10-CM

## 2015-06-28 DIAGNOSIS — I700 Atherosclerosis of aorta: Secondary | ICD-10-CM | POA: Insufficient documentation

## 2015-06-28 DIAGNOSIS — S32509A Unspecified fracture of unspecified pubis, initial encounter for closed fracture: Secondary | ICD-10-CM

## 2015-06-28 DIAGNOSIS — H5702 Anisocoria: Secondary | ICD-10-CM

## 2015-06-28 HISTORY — DX: History of falling: Z91.81

## 2015-06-28 HISTORY — DX: Calculus of gallbladder without cholecystitis without obstruction: K80.20

## 2015-06-28 HISTORY — DX: Unspecified urinary incontinence: R32

## 2015-06-28 HISTORY — DX: Atherosclerosis of aorta: I70.0

## 2015-06-28 HISTORY — DX: Unspecified fracture of sacrum, initial encounter for closed fracture: S32.10XA

## 2015-06-28 HISTORY — DX: Hyperglycemia, unspecified: R73.9

## 2015-06-28 HISTORY — DX: Unspecified fracture of unspecified pubis, initial encounter for closed fracture: S32.509A

## 2015-06-28 HISTORY — DX: Anisocoria: H57.02

## 2015-06-28 MED ORDER — GABAPENTIN 300 MG PO CAPS: ORAL_CAPSULE | ORAL | Status: AC

## 2015-06-28 MED ORDER — RISPERIDONE 0.5 MG PO TABS: ORAL_TABLET | ORAL | Status: AC

## 2015-06-28 MED ORDER — PAROXETINE HCL 10 MG PO TABS: ORAL_TABLET | ORAL | Status: AC

## 2015-06-28 MED ORDER — RISPERIDONE 1 MG PO TABS: ORAL_TABLET | ORAL | Status: AC

## 2015-06-28 NOTE — Progress Notes (Signed)
[facility] Room Number: 111A  Place of Service: [facility] (31)     No Known Allergies  Chief Complaint  Patient presents with  . Acute Visit    HPI:  Acute visit for evaluation of patient. He fell 06/25/15 and again 06/28/15 days ago and has had poor intake since then there were no signs or symptoms of new injury.  Sutured wound over the right eye has a small amount of bleeding, but appears to be healing well and there are no signs of infection.  Patient continues to be poorly interactive with the examiner. He is on several sedating medications. We will attempt to continue gradual reductions.  Medications: Patient's Medications  New Prescriptions   No medications on file  Previous Medications   ASPIRIN 325 MG EC TABLET    Take 1 tablet (325 mg total) by mouth daily.   BENZTROPINE (COGENTIN) 0.5 MG TABLET    Take 1 tablet (0.5 mg total) by mouth 2 (two) times daily.   GABAPENTIN (NEURONTIN) 300 MG CAPSULE    Take 2 capsules (600 mg total) by mouth 3 (three) times daily.   LEVOTHYROXINE (SYNTHROID, LEVOTHROID) 75 MCG TABLET    Take 1 tablet (75 mcg total) by mouth daily before breakfast.   LORAZEPAM (ATIVAN) 0.5 MG TABLET    Take 1 tablet (0.5 mg total) by mouth daily.   LORAZEPAM (ATIVAN) 1 MG TABLET    Take 1 tablet (1 mg total) by mouth at bedtime.   MORPHINE SULFATE (MORPHINE CONCENTRATE) 10 MG / 0.5 ML CONCENTRATED SOLUTION    Place 0.25-0.5 mLs (5-10 mg total) under the tongue every 4 (four) hours as needed for moderate pain, severe pain, anxiety or shortness of breath.   OMEGA-3 FATTY ACIDS (FISH OIL) 1000 MG CAPS    Take 1 capsule by mouth 2 (two) times daily.   PAROXETINE (PAXIL) 20 MG TABLET    Take 1 tablet (20 mg total) by mouth daily.   RISPERIDONE (RISPERDAL) 1 MG TABLET    Take 1 mg by mouth every morning.   RISPERIDONE (RISPERDAL) 2 MG TABLET    Take 2 mg by mouth at bedtime.   SENNA-DOCUSATE (SENOKOT-S) 8.6-50 MG TABLET    Take 2  tablets by mouth at bedtime.  Modified Medications   No medications on file  Discontinued Medications   No medications on file     Review of Systems  Constitutional: Positive for appetite change and fatigue. Negative for fever, activity change and unexpected weight change.  HENT: Positive for dental problem. Negative for congestion, ear pain, hearing loss, rhinorrhea, sore throat, tinnitus, trouble swallowing and voice change.   Eyes: Negative.   Respiratory: Negative for cough, choking, chest tightness, shortness of breath and wheezing.   Cardiovascular: Negative for chest pain, palpitations and leg swelling.  Gastrointestinal: Negative for nausea, abdominal pain, diarrhea, constipation and abdominal distention.  Endocrine: Negative for cold intolerance, heat intolerance, polydipsia, polyphagia and polyuria.       Hyperglycemia noted when last hospitalized  Genitourinary: Negative for dysuria, urgency, frequency and testicular pain.        incontinent  Musculoskeletal: Negative for myalgias, back pain, arthralgias and neck pain.  Skin: Negative for color change, pallor and rash.  Allergic/Immunologic: Negative.   Neurological: Negative for dizziness, tremors, syncope, speech difficulty, weakness, numbness and headaches.       History of mental retardation. Appears demented.  Hematological: Negative for adenopathy. Does not bruise/bleed easily.  Psychiatric/Behavioral:  Positive for confusion. Negative for hallucinations, behavioral problems, sleep disturbance and decreased concentration. The patient is not nervous/anxious.        History schizophrenia with paranoia    Filed Vitals:   06/28/15 0905  BP: 140/74  Pulse: 77  Temp: 97.1 F (36.2 C)  TempSrc: Oral  Resp: 21   Wt Readings from Last 3 Encounters:  06/17/15 170 lb (77.111 kg)  09/29/13 140 lb 6.4 oz (63.685 kg)  08/20/13 160 lb (72.576 kg)    There is no weight on file to calculate BMI.  Physical Exam    Constitutional: He is oriented to person, place, and time. No distress.  Extremely thin.  HENT:  Right Ear: External ear normal.  Left Ear: External ear normal.  Nose: Nose normal.  Mouth/Throat: Oropharynx is clear and moist. No oropharyngeal exudate.  Eyes: Conjunctivae and EOM are normal. Pupils are equal, round, and reactive to light.  Anisocoria with right pupil larger than the left.  Neck: No JVD present. No tracheal deviation present. No thyromegaly present.  Cardiovascular: Normal rate, regular rhythm, normal heart sounds and intact distal pulses.  Exam reveals no gallop and no friction rub.   No murmur heard. Pulmonary/Chest: No respiratory distress. He has no wheezes. He has no rales. He exhibits no tenderness.  Abdominal: He exhibits no distension and no mass. There is no tenderness.  Musculoskeletal: Normal range of motion. He exhibits no edema or tenderness.  Lymphadenopathy:    He has no cervical adenopathy.  Neurological: He is alert and oriented to person, place, and time. He has normal reflexes. No cranial nerve deficit. Coordination normal.  Skin: No rash noted. No erythema. No pallor.  Sutured laceration above right eyebrow with surrounding hematoma and bruising.  Psychiatric:  Patient does not interact with the examiner     Labs reviewed: Lab Summary Latest Ref Rng 06/25/2015 06/20/2015 06/19/2015  Hemoglobin 13.0 - 17.0 g/dL 12.1(L) 11.9(L) 13.6  Hematocrit 39.0 - 52.0 % 36.4(L) 35.8(L) 39.9  White count 4.0 - 10.5 K/uL 12.5(H) 10.4 14.2(H)  Platelet count 150 - 400 K/uL 335 274 306  Sodium 135 - 145 mmol/L 145 142 139  Potassium 3.5 - 5.1 mmol/L 3.2(L) 3.5 3.3(L)  Calcium 8.9 - 10.3 mg/dL 8.7(L) 8.5(L) 8.5(L)  Phosphorus - (None) (None) (None)  Creatinine 0.61 - 1.24 mg/dL 0.88 0.58(L) 0.68  AST 15 - 41 U/L 49(H) (None) (None)  Alk Phos 38 - 126 U/L 136(H) (None) (None)  Bilirubin 0.3 - 1.2 mg/dL 0.8 (None) (None)  Glucose 65 - 99 mg/dL 169(H) 127(H) 124(H)   Cholesterol - (None) (None) (None)  HDL cholesterol - (None) (None) (None)  Triglycerides - (None) (None) (None)  LDL Direct - (None) (None) (None)  LDL Calc - (None) (None) (None)  Total protein 6.5 - 8.1 g/dL 6.2(L) (None) (None)  Albumin 3.5 - 5.0 g/dL 3.1(L) (None) (None)   Lab Results  Component Value Date   TSH 1.062 06/18/2015   Lab Results  Component Value Date   BUN 26* 06/25/2015   BUN 23* 06/20/2015   BUN 8 06/19/2015   Lab Results  Component Value Date   CREATININE 0.88 06/25/2015   CREATININE 0.58* 06/20/2015   CREATININE 0.68 06/19/2015   Lab Results  Component Value Date   HGBA1C 6.1* 06/18/2015   HGBA1C 5.9* 08/12/2013       Assessment/Plan  1. History of fall No new injury secondary to fall  2. Paranoid schizophrenia ([place]) Risperdal now at 0.5 in the  morning and 1 mg in the evening  3. Depression Reduce Paxil to 10 mg  Reduce Neurontin to 600 mg twice daily Discontinue Cogentin Discontinue omega-3 fatty acids

## 2015-06-28 NOTE — Progress Notes (Signed)
HISTORY AND PHYSICAL  Location:  [facility] [place] Room Number: 111 A Place of Service: SNF (31)   Extended Emergency Contact Information Primary Emergency Contact: Hyman Hopes Address: [address]          [place], [place] [address] Johnnette Litter of [place] Phone: [phone] Mobile Phone: [phone] Relation: Other Secondary Emergency Contact: Deanne Coffer [place] of [place] Phone: [phone] Work Phone: [phone] Relation: Sister  Advanced Directive information Does patient have an advance directive?: Yes, Type of Advance Directive: Out of facility DNR (pink MOST or yellow form), Pre-existing out of facility DNR order (yellow form or pink MOST form): Yellow form placed in chart (order not valid for inpatient use), Does patient want to make changes to advanced directive?: No - Patient declined  Chief Complaint  Patient presents with  . New Admit To SNF    HPI:  75 year old male admitted to skilled nursing facility following hospitalization from 06/17/2015 through 06/23/2015. Previously cared for an assisted-living facility. Patient had a CVA preceding his hospital stay. 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 He is quite debilitated and is unable to care for himself. Staff is providing all activities of daily living.   Appetite is poor and intake is marginal.   Patient is mentally retarded and carries a diagnosis of schizophrenia.  He is incontinent of urine.  There are multiple other known diagnoses including hypothyroidism, depression, history of falls, dyspnea, carotid stenoses, aortic atherosclerosis, cholelithiasis, hyperglycemia, history of sacral fracture bilaterally and pubic bones fracture bilaterally.  Patient did fall prior to his hospital stay and he had a large  laceration of forehead which has been sutured. There is extensive bruising of the forehead and right periocular area as result of this.  Past Medical History  Diagnosis Date  . Paranoid schizophrenia ([place])   . Stroke ([place])   . Hypothyroid   . Mental retardation   . Schizophrenia, acute ([place])   . Depression   . Stroke (cerebrum) ([place])   . Carotid stenosis   . Cerebrovascular accident (CVA) due to occlusion of right middle cerebral artery ([place])   . CVA (cerebral infarction) 08/11/2013  . CVA (cerebral vascular accident) ([place]) 08/11/2013  . Fall 06/17/2015  . Forehead laceration   . Gaseous abdominal distention   . History of fall 06/28/2015  . Hypothyroidism 08/11/2013  . SOB (shortness of breath)   . Aortic atherosclerosis ([place]) 06/28/2015  . Sacral fracture ([place]) 06/28/2015  . Pubic bone fracture ([place]) 06/28/2015  . Cholelithiasis 06/28/2015  . Hyperglycemia 06/28/2015  . Anisocoria 06/28/2015    Right pupil greater than the left pupil     No past surgical history on file.  No care team member to display  Social History   Social History  . Marital Status: Single    Spouse Name: N/A  . Number of Children: N/A  . Years of Education: N/A   Occupational History  . Not on file.   Social History Main Topics  . Smoking status: Unknown If Ever Smoked  . Smokeless tobacco: Not on file  . Alcohol Use: Not on file  . Drug Use: Not on file  . Sexual Activity: Not on file   Other Topics Concern  . Not on file   Social History Narrative   ** Merged History Encounter **  has no tobacco, alcohol, and drug history on file.  No family history on file. No family status information on file.    Immunization History  Administered Date(s) Administered  . Tdap 06/18/2015    No Known Allergies  Medications: Patient's Medications  New Prescriptions   No medications on file  Previous Medications   ASPIRIN 325 MG EC TABLET    Take 1 tablet (325 mg total) by mouth daily.    BENZTROPINE (COGENTIN) 0.5 MG TABLET    Take 1 tablet (0.5 mg total) by mouth 2 (two) times daily.   GABAPENTIN (NEURONTIN) 300 MG CAPSULE    Take 2 capsules (600 mg total) by mouth 3 (three) times daily.   LEVOTHYROXINE (SYNTHROID, LEVOTHROID) 75 MCG TABLET    Take 1 tablet (75 mcg total) by mouth daily before breakfast.   LORAZEPAM (ATIVAN) 0.5 MG TABLET    Take 1 tablet (0.5 mg total) by mouth daily.   LORAZEPAM (ATIVAN) 1 MG TABLET    Take 1 tablet (1 mg total) by mouth at bedtime.   MORPHINE SULFATE (MORPHINE CONCENTRATE) 10 MG / 0.5 ML CONCENTRATED SOLUTION    Place 0.25-0.5 mLs (5-10 mg total) under the tongue every 4 (four) hours as needed for moderate pain, severe pain, anxiety or shortness of breath.   OMEGA-3 FATTY ACIDS (FISH OIL) 1000 MG CAPS    Take 1 capsule by mouth 2 (two) times daily.   PAROXETINE (PAXIL) 20 MG TABLET    Take 1 tablet (20 mg total) by mouth daily.   RISPERIDONE (RISPERDAL) 1 MG TABLET    Take 1 mg by mouth every morning.   RISPERIDONE (RISPERDAL) 2 MG TABLET    Take 2 mg by mouth at bedtime.   SENNA-DOCUSATE (SENOKOT-S) 8.6-50 MG TABLET    Take 2 tablets by mouth at bedtime.  Modified Medications   No medications on file  Discontinued Medications   No medications on file    Review of Systems  Constitutional: Negative for fever, activity change, appetite change, fatigue and unexpected weight change.       Emaciated  HENT: Positive for dental problem. Negative for congestion, ear pain, hearing loss, rhinorrhea, sore throat, tinnitus, trouble swallowing and voice change.   Eyes:       Corrective lenses  Respiratory: Negative for cough, choking, chest tightness, shortness of breath and wheezing.   Cardiovascular: Negative for chest pain, palpitations and leg swelling.  Gastrointestinal: Negative for nausea, abdominal pain, diarrhea, constipation and abdominal distention.  Endocrine: Negative for cold intolerance, heat intolerance, polydipsia, polyphagia and  polyuria.  Genitourinary: Negative for dysuria, urgency, frequency and testicular pain.       Not incontinent  Musculoskeletal: Positive for gait problem (Unable to stand unassisted). Negative for myalgias, back pain, arthralgias and neck pain.  Skin: Negative for color change, pallor and rash.       Sutured laceration right forehead  Allergic/Immunologic: Negative.   Neurological: Positive for speech difficulty. Negative for dizziness, tremors, syncope, weakness, numbness and headaches.       No verbal responses. Not cooperative with exam  Hematological: Negative for adenopathy. Does not bruise/bleed easily.  Psychiatric/Behavioral: Positive for agitation. Negative for hallucinations, behavioral problems, confusion, sleep disturbance and decreased concentration. The patient is not nervous/anxious.     Filed Vitals:   06/28/15 1012  BP: 130/90  Pulse: 74  Temp: 97.5 F (36.4 C)  TempSrc: Oral  Resp: 16   There is no weight on file to calculate BMI. There were  no vitals filed for this visit.   Physical Exam  Constitutional: He is oriented to person, place, and time. No distress.  Thin and emaciated  HENT:  Right Ear: External ear normal.  Left Ear: External ear normal.  Nose: Nose normal.  Mouth/Throat: Oropharynx is clear and moist. No oropharyngeal exudate.  Eyes: Conjunctivae and EOM are normal. Pupils are equal, round, and reactive to light.  Anisocoria with right pupil larger than the left  Neck: No JVD present. No tracheal deviation present. No thyromegaly present.  Cardiovascular: Normal rate, regular rhythm and normal heart sounds.  Exam reveals no gallop and no friction rub.   No murmur heard. Absent DP and PT pulses bilaterally  Pulmonary/Chest: No respiratory distress. He has no wheezes. He has rales. He exhibits no tenderness.  Bronchial rattle  Abdominal: He exhibits no distension and no mass. There is no tenderness.  Musculoskeletal: Normal range of motion. He  exhibits no edema or tenderness.  Lymphadenopathy:    He has no cervical adenopathy.  Neurological: He is alert and oriented to person, place, and time. He has normal reflexes. No cranial nerve deficit. Coordination normal.  severe dementia. No response to examiner. Diminished reflexes at knees and Achilles tendon.  Skin: No rash noted. No erythema. No pallor.  Laceration of right forehead with multiple sutures. Surrounded by extensive bruising and hematoma under the laceration. 20 x 12 mm mass at the right side of the mouth. Possible sebaceous cyst.  Psychiatric: He has a normal mood and affect. His behavior is normal. Judgment and thought content normal.    Labs reviewed: Lab Summary Latest Ref Rng 06/25/2015 06/20/2015 06/19/2015  Hemoglobin 13.0 - 17.0 g/dL 12.1(L) 11.9(L) 13.6  Hematocrit 39.0 - 52.0 % 36.4(L) 35.8(L) 39.9  White count 4.0 - 10.5 K/uL 12.5(H) 10.4 14.2(H)  Platelet count 150 - 400 K/uL 335 274 306  Sodium 135 - 145 mmol/L 145 142 139  Potassium 3.5 - 5.1 mmol/L 3.2(L) 3.5 3.3(L)  Calcium 8.9 - 10.3 mg/dL 8.7(L) 8.5(L) 8.5(L)  Phosphorus - (None) (None) (None)  Creatinine 0.61 - 1.24 mg/dL 0.88 0.58(L) 0.68  AST 15 - 41 U/L 49(H) (None) (None)  Alk Phos 38 - 126 U/L 136(H) (None) (None)  Bilirubin 0.3 - 1.2 mg/dL 0.8 (None) (None)  Glucose 65 - 99 mg/dL 169(H) 127(H) 124(H)  Cholesterol - (None) (None) (None)  HDL cholesterol - (None) (None) (None)  Triglycerides - (None) (None) (None)  LDL Direct - (None) (None) (None)  LDL Calc - (None) (None) (None)  Total protein 6.5 - 8.1 g/dL 6.2(L) (None) (None)  Albumin 3.5 - 5.0 g/dL 3.1(L) (None) (None)   Lab Results  Component Value Date   BUN 26* 06/25/2015   Lab Results  Component Value Date   HGBA1C 6.1* 06/18/2015   Lab Results  Component Value Date   TSH 1.062 06/18/2015          Ct Angio Head W/cm &/or Wo Cm  06/19/2015  CLINICAL DATA:  Chronic mental retardation.  Stroke. EXAM: CT ANGIOGRAPHY  HEAD AND NECK TECHNIQUE: Multidetector CT imaging of the head and neck was performed using the standard protocol during bolus administration of intravenous contrast. Multiplanar CT image reconstructions and MIPs were obtained to evaluate the vascular anatomy. Carotid stenosis measurements (when applicable) are obtained utilizing NASCET criteria, using the distal internal carotid diameter as the denominator. CONTRAST:  32m OMNIPAQUE IOHEXOL 350 MG/ML SOLN COMPARISON:  MRI 06/18/2015.  CT 06/17/2015. FINDINGS: CT HEAD Low-density in the right  frontal lobe consistent with the acute infarction in that region. No mass effect or shift. Old left frontal cortical and subcortical infarction. No other focal insult visible. Widespread chronic small-vessel disease of the deep white matter. No hemorrhage, hydrocephalus or extra-axial collection. No skull fracture. Small amount of fluid in the sphenoid sinus. CTA NECK Aortic arch: Atherosclerosis of the arch but no aneurysm or dissection. Branching pattern of the brachiocephalic vessels is normal without origin stenosis. Right carotid system: Common carotid artery widely patent to the bifurcation. Mild atherosclerotic disease at the bifurcation but no stenosis or irregularity. Cervical internal carotid artery widely patent. Atherosclerotic calcification proximal to the skullbase with stenosis no more than 20%. Left carotid system: Common carotid artery widely patent to the bifurcation. Atherosclerotic disease affecting the distal common carotid artery with stenosis of 30% in that location. Atherosclerotic disease at the carotid bifurcation with severe stenosis at the distal bulb. Patent luminal diameter through that location measures 1 mm or less. The more distal cervical ICA shows reduced caliber because of reduced inflow. This likely represents a greater than 90% stenosis. Vertebral arteries:Both vertebral artery origins are widely patent with the right being dominant. Both  vertebral arteries are widely patent through the cervical region. Skeleton: Advanced chronic cervical spondylosis.  No acute finding. Other neck: No other soft tissue lesion in the neck. The esophagus does appear to be thick walled. Layering pleural effusion on the right. CTA HEAD Anterior circulation: Both internal carotid arteries are patent through the siphon regions. There is considerable motion degradation in this region. Atherosclerotic calcification probably results in siphon narrowing on the left are approximately 50%. Anterior and middle cerebral vessels are patent without proximal stenosis, aneurysm or vascular malformation. Posterior circulation: Both vertebral arteries are patent to the basilar. No basilar stenosis. Posterior circulation branch vessels are normal. Venous sinuses: Patent and normal Anatomic variants: None significant Delayed phase: No abnormal enhancement IMPRESSION: Low-density has now developed in the right frontal lobe in the region of acute infarction. No evidence of hemorrhage or mass effect. No right carotid bifurcation stenosis. Severe stenosis of the distal bulb on the left, 90% or greater, with diminished flow beyond that. No intracranial large or medium vessel occlusion or visible missing branches. Electronically Signed   By: Nelson Chimes M.D.   On: 06/19/2015 10:50   Ct Head Wo Contrast  06/26/2015  CLINICAL DATA:  Unwitnessed fall with right eye laceration. Initial encounter. EXAM: CT HEAD WITHOUT CONTRAST CT CERVICAL SPINE WITHOUT CONTRAST TECHNIQUE: Multidetector CT imaging of the head and cervical spine was performed following the standard protocol without intravenous contrast. Multiplanar CT image reconstructions of the cervical spine were also generated. COMPARISON:  06/18/2015 brain MRI. Cervical spine and head CT 06/18/2015 FINDINGS: CT HEAD FINDINGS Skull and Sinuses:There is a large laceration in the right forehead with gas extending to bone. No opaque foreign body  or fracture. Visualized orbits: Negative. Brain: No posttraumatic findings such as hemorrhage or swelling. There is a indistinct low-density of right insula and anterior frontal cortex and subcortical white matter consistent with infarct seen January 2017. Smaller infarcts seen on comparison brain MRI are not well visualized. There is a more remote, similar distribution infarct in the left frontal lobe. Chronic small-vessel disease with cerebral white matter ischemic gliosis that is diffuse. CT CERVICAL SPINE FINDINGS Due to patient's mental status/dementia there is unavoidable motion, which primarily affected the C4 to C7 vertebrae. There is no indication of fracture or traumatic malalignment. Diffuse disc narrowing and facet spurring.  No gross canal hematoma or prevertebral edema. IMPRESSION: 1. Right forehead laceration without intracranial hemorrhage or fracture. 2. Motion degraded cervical CT which could obscure pathology. No evidence of cervical spine injury. 3. Bilateral frontal infarcts, late subacute on the right and chronic on the left. Electronically Signed   By: Monte Fantasia M.D.   On: 06/26/2015 00:16   Ct Head Wo Contrast  06/17/2015  CLINICAL DATA:  Status post fall today.  Initial encounter. EXAM: CT HEAD WITHOUT CONTRAST TECHNIQUE: Contiguous axial images were obtained from the base of the skull through the vertex without intravenous contrast. COMPARISON:  None. FINDINGS: Contusion and laceration over right frontal bone are identified. There is no underlying fracture. The brain is atrophic with chronic microvascular ischemic change. Remote appearing left frontal infarct is noted. No acute intracranial abnormality including hemorrhage, infarct, mass lesion, mass effect, midline shift or abnormal extra-axial fluid collection. IMPRESSION: Contusion and laceration over the right side of the frontal bone. Negative for fracture or acute intracranial abnormality. Atrophy, chronic microvascular  ischemic change and remote left frontal infarct. These results were called by telephone at the time of interpretation on 06/17/2015 at 8:30 pm to Dr. Lajean Saver , who verbally acknowledged these results. Electronically Signed   By: Inge Rise M.D.   On: 06/17/2015 20:30   Ct Angio Neck W/cm &/or Wo/cm  06/19/2015  CLINICAL DATA:  Chronic mental retardation.  Stroke. EXAM: CT ANGIOGRAPHY HEAD AND NECK TECHNIQUE: Multidetector CT imaging of the head and neck was performed using the standard protocol during bolus administration of intravenous contrast. Multiplanar CT image reconstructions and MIPs were obtained to evaluate the vascular anatomy. Carotid stenosis measurements (when applicable) are obtained utilizing NASCET criteria, using the distal internal carotid diameter as the denominator. CONTRAST:  84m OMNIPAQUE IOHEXOL 350 MG/ML SOLN COMPARISON:  MRI 06/18/2015.  CT 06/17/2015. FINDINGS: CT HEAD Low-density in the right frontal lobe consistent with the acute infarction in that region. No mass effect or shift. Old left frontal cortical and subcortical infarction. No other focal insult visible. Widespread chronic small-vessel disease of the deep white matter. No hemorrhage, hydrocephalus or extra-axial collection. No skull fracture. Small amount of fluid in the sphenoid sinus. CTA NECK Aortic arch: Atherosclerosis of the arch but no aneurysm or dissection. Branching pattern of the brachiocephalic vessels is normal without origin stenosis. Right carotid system: Common carotid artery widely patent to the bifurcation. Mild atherosclerotic disease at the bifurcation but no stenosis or irregularity. Cervical internal carotid artery widely patent. Atherosclerotic calcification proximal to the skullbase with stenosis no more than 20%. Left carotid system: Common carotid artery widely patent to the bifurcation. Atherosclerotic disease affecting the distal common carotid artery with stenosis of 30% in that  location. Atherosclerotic disease at the carotid bifurcation with severe stenosis at the distal bulb. Patent luminal diameter through that location measures 1 mm or less. The more distal cervical ICA shows reduced caliber because of reduced inflow. This likely represents a greater than 90% stenosis. Vertebral arteries:Both vertebral artery origins are widely patent with the right being dominant. Both vertebral arteries are widely patent through the cervical region. Skeleton: Advanced chronic cervical spondylosis.  No acute finding. Other neck: No other soft tissue lesion in the neck. The esophagus does appear to be thick walled. Layering pleural effusion on the right. CTA HEAD Anterior circulation: Both internal carotid arteries are patent through the siphon regions. There is considerable motion degradation in this region. Atherosclerotic calcification probably results in siphon narrowing on the left  are approximately 50%. Anterior and middle cerebral vessels are patent without proximal stenosis, aneurysm or vascular malformation. Posterior circulation: Both vertebral arteries are patent to the basilar. No basilar stenosis. Posterior circulation branch vessels are normal. Venous sinuses: Patent and normal Anatomic variants: None significant Delayed phase: No abnormal enhancement IMPRESSION: Low-density has now developed in the right frontal lobe in the region of acute infarction. No evidence of hemorrhage or mass effect. No right carotid bifurcation stenosis. Severe stenosis of the distal bulb on the left, 90% or greater, with diminished flow beyond that. No intracranial large or medium vessel occlusion or visible missing branches. Electronically Signed   By: Nelson Chimes M.D.   On: 06/19/2015 10:50   Ct Cervical Spine Wo Contrast  06/26/2015  CLINICAL DATA:  Unwitnessed fall with right eye laceration. Initial encounter. EXAM: CT HEAD WITHOUT CONTRAST CT CERVICAL SPINE WITHOUT CONTRAST TECHNIQUE: Multidetector CT  imaging of the head and cervical spine was performed following the standard protocol without intravenous contrast. Multiplanar CT image reconstructions of the cervical spine were also generated. COMPARISON:  06/18/2015 brain MRI. Cervical spine and head CT 06/18/2015 FINDINGS: CT HEAD FINDINGS Skull and Sinuses:There is a large laceration in the right forehead with gas extending to bone. No opaque foreign body or fracture. Visualized orbits: Negative. Brain: No posttraumatic findings such as hemorrhage or swelling. There is a indistinct low-density of right insula and anterior frontal cortex and subcortical white matter consistent with infarct seen January 2017. Smaller infarcts seen on comparison brain MRI are not well visualized. There is a more remote, similar distribution infarct in the left frontal lobe. Chronic small-vessel disease with cerebral white matter ischemic gliosis that is diffuse. CT CERVICAL SPINE FINDINGS Due to patient's mental status/dementia there is unavoidable motion, which primarily affected the C4 to C7 vertebrae. There is no indication of fracture or traumatic malalignment. Diffuse disc narrowing and facet spurring. No gross canal hematoma or prevertebral edema. IMPRESSION: 1. Right forehead laceration without intracranial hemorrhage or fracture. 2. Motion degraded cervical CT which could obscure pathology. No evidence of cervical spine injury. 3. Bilateral frontal infarcts, late subacute on the right and chronic on the left. Electronically Signed   By: Monte Fantasia M.D.   On: 06/26/2015 00:16   Ct Cervical Spine Wo Contrast  06/18/2015  CLINICAL DATA:  Pain following fall EXAM: CT CERVICAL SPINE WITHOUT CONTRAST TECHNIQUE: Multidetector CT imaging of the cervical spine was performed without intravenous contrast. Multiplanar CT image reconstructions were also generated. COMPARISON:  None. FINDINGS: There is no demonstrable fracture or spondylolisthesis. The prevertebral soft tissues  and predental space regions are normal. There is moderately severe disc space narrowing at C3-4, C4-5, C6-7, and C7-T1. There is multilevel facet hypertrophy. There is exit foraminal narrowing due to bony hypertrophy at C3-4 bilaterally, C4-5 bilaterally, C5-6 bilaterally, and C6-7 bilaterally. There is mild amount moderate spinal stenosis at C3-4 and C4-5 due to bony hypertrophy. There are foci of carotid artery calcification, more on the left than on the right. There is also atherosclerotic calcification in the aortic arch. IMPRESSION: No apparent fracture or spondylolisthesis. Multifocal osteoarthritic change. Mild to moderate spinal stenosis at C3-4 and C4-5 due to bony hypertrophy. Bilateral carotid artery calcification. Electronically Signed   By: Lowella Grip III M.D.   On: 06/18/2015 10:04   Mr Jeri Cos LG Contrast  06/18/2015  CLINICAL DATA:  Initial evaluation for fall, altered mental status, slurred speech. History of schizophrenia, depression. EXAM: MRI HEAD WITHOUT AND WITH CONTRAST TECHNIQUE:  Multiplanar, multiecho pulse sequences of the brain and surrounding structures were obtained without and with intravenous contrast. CONTRAST:  67m MULTIHANCE GADOBENATE DIMEGLUMINE 529 MG/ML IV SOLN COMPARISON:  Prior CT from 06/17/2015. FINDINGS: Diffuse prominence of the CSF containing spaces is compatible with generalized atrophy, moderate for age. Patchy and confluent T2/FLAIR hyperintensity within the periventricular and deep white matter most consistent with chronic small vessel ischemic changes, also moderate in nature. Small vessel type changes present within the pons. Focal encephalomalacia within the anterior left frontal lobe compatible with remote infarct. Additional small remote bilateral cerebellar infarcts noted. Scatter remote lacunar infarcts involve the bilateral basal ganglia. Confluent restricted diffusion involving the anterior right operculum the with extension into the right insular  cortex and subinsular white matter consistent with acute right MCA territory infarct. Additional patchy infarct within knee right basal ganglia/corona radiata out. It is patchy cortical infarcts more posteriorly within the right parietal and occipital lobes. Additional small cortical infarct within the parasagittal left parietal lobe. There is gyral swelling and edema on T2/FLAIR sequences without significant mass effect. Mild associated petechial hemorrhage within the right parietal lobe (Series 7, image 18). No other evidence for hemorrhage. Abnormal flow void within the left ICA and MCA, which may reflect slow flow and/or atheromatous disease. The left ICA itself may be occluded, although the left MCA is felt to be at least partially patent. Remainder of the intracranial vascular flow voids are patent, although atheromatous disease noted within the vertebrobasilar system. No mass lesion, midline shift, or mass effect. Ventricular prominence related to global parenchymal volume loss without hydrocephalus. No extra-axial fluid collection. No abnormal enhancement following contrast administration. Craniocervical junction within normal limits. Multilevel degenerative spondylolysis noted within the upper cervical spine. There is apparent moderate to severe canal stenosis at the C3-4 level. Pituitary gland within normal limits. No acute abnormality about the orbits. Mild scattered mucosal thickening within the ethmoidal air cells. Minimal layering opacity within the right sphenoid sinus. No significant mastoid effusion. Inner ear structures grossly normal. Bone marrow signal intensity within normal limits. Right frontal scalp contusion present. IMPRESSION: 1. Patchy moderate sized multi focal right MCA territory ischemic infarcts as above. Additional small cortical ischemic infarct within the parasagittal left parietal lobe. 2. Remote left MCA territory infarct with additional remote lacunar infarcts involving the  bilateral basal ganglia and cerebellar hemispheres. 3. Abnormal flow void within the left ICA and to a lesser extent left MCA, which may reflect slow flow and/or partial occlusion or occlusion. This is suspected to likely be chronic in nature given the remote left MCA territory infarct. 4. Moderate atrophy with chronic small vessel ischemic disease. 5. Right frontal scalp contusion. Electronically Signed   By: BJeannine BogaM.D.   On: 06/18/2015 22:12   Ct Abdomen Pelvis W Contrast  06/18/2015  CLINICAL DATA:  75year old male with fall, abdominal pain and distention. EXAM: CT ABDOMEN AND PELVIS WITH CONTRAST TECHNIQUE: Multidetector CT imaging of the abdomen and pelvis was performed using the standard protocol following bolus administration of intravenous contrast. CONTRAST:  731mOMNIPAQUE IOHEXOL 300 MG/ML  SOLN COMPARISON:  None. FINDINGS: Lower chest: A tiny right pleural effusion is noted with small bibasilar atelectasis. Cardiomegaly and coronary artery calcifications are present. Hepatobiliary: Multiple cysts are identified. Cholelithiasis is identified without CT evidence of acute cholecystitis. There is no evidence of biliary dilatation. Pancreas: Unremarkable Spleen: Unremarkable Adrenals/Urinary Tract: Mild bilateral renal cortical thinning noted. There is no evidence of renal mass, urinary calculi or hydronephrosis. Stomach/Bowel: The  sigmoid colon and rectum are distended with gas and fluid, with the sigmoid colon measuring up to 15 cm in greatest transverse dimension. Mild to moderate distention of the remainder of the colon with stool and gas noted. No definite obstructing lesion identified. There is no evidence of small bowel dilatation. Appendix is normal. Vascular/Lymphatic: Aortic atherosclerotic calcifications noted without aneurysm. No enlarged lymph nodes are identified. Reproductive: Prostate calcifications noted. Other: No free fluid, pneumoperitoneum or abscess. Musculoskeletal:  An acute to subacute appearing fracture of the posterior right seventh rib is noted. Fractures involving the sacrum bilaterally, bilateral medial pubic bones, left inferior pubic ramus, and right L5 transverse process are age indeterminate but appear subacute. Heterotopic ossification adjacent to the left inferior pubic ramus fracture is noted. Remote appearing left rib fractures are present. IMPRESSION: Moderate to severe distention of the sigmoid colon and rectum with the sigmoid colon measuring up to 15 cm in greatest transverse dimension. Mild to moderate distention of the remainder of the colon containing stool and gas. No identifiable obstructing process. No evidence of pneumoperitoneum or small bowel distention. Acute to subacute appearing fracture of the posterior right seventh rib. Tiny right pleural effusion. Fractures involving the sacrum bilaterally, medial pubic bones bilaterally, left inferior pubic ramus and right L5 transverse process - age indeterminate but appears subacute. Correlate with pain in history. Cholelithiasis without CT evidence of acute cholecystitis. Electronically Signed   By: Margarette Canada M.D.   On: 06/18/2015 10:29   Dg Chest Port 1 View  06/25/2015  CLINICAL DATA:  Patient with stroke status post fall. EXAM: PORTABLE CHEST 1 VIEW COMPARISON:  Chest radiograph 08/11/2013 FINDINGS: Low lung volumes. Stable cardiac and mediastinal contours. Multiple monitoring leads overlie the patient. Heterogeneous opacities right lung base. No pleural effusion or pneumothorax. There is colonic interposition with marked colonic distention. IMPRESSION: Low lung volumes with heterogeneous opacities right lung base favored to represent atelectasis. Infection not excluded. Colonic interposition and dilated colon likely secondary to ileus. Given the overall appearance, if there is concern for acute pathology, consider correlation with abdominal radiographs and decubitus views to exclude the possibility  of free intraperitoneal air or obstructive process. Electronically Signed   By: Lovey Newcomer M.D.   On: 06/25/2015 22:46   Dg Chest Port 1 View  06/21/2015  CLINICAL DATA:  Shortness breath. EXAM: PORTABLE CHEST 1 VIEW COMPARISON:  06/19/2015.  CT 06/18/2015. FINDINGS: Mediastinum hilar structures are stable. Cardiomegaly with mild bilateral pulmonary interstitial prominence suggesting mild congestive heart failure. Low lung volumes with basilar atelectasis. Colonic distention again noted. Colonic interposition under the left hemidiaphragm again noted . IMPRESSION: 1. Cardiomegaly with mild bilateral from interstitial prominence suggesting congestive heart failure. 2. Persistent colonic distention. Colonic interposition of the left hemidiaphragm again noted . Electronically Signed   By: Marcello Moores  Register   On: 06/21/2015 07:35   Dg Chest Port 1 View  06/19/2015  CLINICAL DATA:  Shortness of breath EXAM: PORTABLE CHEST 1 VIEW COMPARISON:  06/17/2015 FINDINGS: Stable heart size with central vascular congestion and basilar atelectasis evident. No definite edema pattern or CHF. No focal pneumonia, collapse or consolidation. No effusion or pneumothorax. Trachea midline. Atherosclerosis of the aorta. Diffuse distention of the bowel in the epigastric region compatible with ileus. IMPRESSION: Increased vascular congestion and basilar atelectasis. Gaseous distention in the upper abdomen suspicious for ileus. Electronically Signed   By: Jerilynn Mages.  Shick M.D.   On: 06/19/2015 09:00   Dg Chest Portable 1 View  06/17/2015  CLINICAL DATA:  Head trauma secondary to a fall today. Code stroke. Initial encounter. EXAM: PORTABLE CHEST 1 VIEW COMPARISON:  None. FINDINGS: The lungs are clear. Heart size is upper normal. No pneumothorax or pleural effusion. No focal bony abnormality. Gaseous distention of visualized bowel loops in the upper abdomen noted. IMPRESSION: No acute cardiopulmonary disease. Gaseous distention of visualized  bowel loops is nonspecific but may be be due to ileus. Electronically Signed   By: Inge Rise M.D.   On: 06/17/2015 20:10   Dg Abd 2 Views  06/19/2015  CLINICAL DATA:  Abdominal distention.  Unresponsive. EXAM: ABDOMEN - 2 VIEW COMPARISON:  CT 06/18/2015 FINDINGS: Severe gaseous distention of the colon. Large stool burden throughout the colon. Findings are similar to prior CT. Contrast material noted within the collecting systems of the kidneys bilaterally. No free air. IMPRESSION: Continued marked gaseous distention of the colon. Large stool burden. Electronically Signed   By: Rolm Baptise M.D.   On: 06/19/2015 10:46     Assessment/Plan  1. Cerebrovascular accident (CVA) due to occlusion of right middle cerebral artery ([place]) Patient is engaged in physical therapy therapy and occupational therapy. Most of this, this time, his passive.  2. Failure to thrive in adult Poor responsiveness. Poor intake. Patient is on other sedating drugs including Neurontin 600 mg 3 times a day and Ativan. Consider reductions at future visits. Currently, has had hospice consult and is NO CODE BLUE.  3. Forehead laceration, initial encounter Healing without signs of infection. Plan to remove sutures 07/05/2015.  4. Closed fracture of sacrum with routine healing, unspecified fracture morphology, subsequent encounter Noted on x-rays. Uncertain as to when this occurred.  5. Closed fracture of pubis with routine healing, unspecified laterality, unspecified portion of pubis, subsequent encounter Noted on x-rays. Vital onset uncertain.  6. Hyperglycemia Mild, but will need further lab follow-up.  7. 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 Paranoid schizophrenia ([place]) No evidence at this time paranoia. Because of some lethargy, have elected to reduce his Risperdal to 0.5 mg daily and 1 mg at bedtime. He was previously on double this amount.  9.  Depression Currently on Paxil 20 mg daily.

## 2015-06-29 LAB — URINE CULTURE: Special Requests: NORMAL

## 2015-06-30 ENCOUNTER — Telehealth (HOSPITAL_COMMUNITY): Payer: Self-pay

## 2015-06-30 NOTE — Telephone Encounter (Signed)
Post ED Visit - Positive Culture Follow-up  Culture report reviewed by antimicrobial stewardship pharmacist:   Enzo Bi, Pharm.D.  Celedonio Miyamoto, Pharm.D., BCPS  Garvin Fila, Pharm.D.  Georgina Pillion, Pharm.D., BCPS  [place], [address].D., BCPS, AAHIVP  Estella Husk, Pharm.D., BCPS, AAHIVP  Tennis Must, Pharm.D.  Sherle Poe, [address].D.  Positive urine culture, 30,000 colonies -> Vancomycin resistant enterococcus Treated with Fosfosmycin, organism sensitive to the same and no further patient follow-up is required at this time.  Arvid Right 06/30/2015, 4:41 PM

## 2015-07-01 ENCOUNTER — Encounter: Payer: Self-pay | Admitting: Internal Medicine

## 2015-07-01 ENCOUNTER — Non-Acute Institutional Stay (SKILLED_NURSING_FACILITY): Payer: Medicare Other | Admitting: Internal Medicine

## 2015-07-01 DIAGNOSIS — S0181XA Laceration without foreign body of other part of head, initial encounter: Secondary | ICD-10-CM

## 2015-07-01 DIAGNOSIS — Z9181 History of falling: Secondary | ICD-10-CM

## 2015-07-01 DIAGNOSIS — J189 Pneumonia, unspecified organism: Secondary | ICD-10-CM

## 2015-07-01 LAB — CULTURE, BLOOD (ROUTINE X 2)
CULTURE: NO GROWTH
Culture: NO GROWTH

## 2015-07-01 NOTE — Progress Notes (Signed)
Patient ID: Scott [name], male   DOB: 1941/01/21, 75 y.o.   MRN: [id number]    [facility] Room Number: 111 A  Place of Service: SNF (31)     No Known Allergies  Chief Complaint  Patient presents with  . Acute Visit    Respiratory distress    HPI:  Patient has had a significant decline since yesterday. He now has congestion, cough, increased temperature, and very poor intake. Chest x-ray was ordered and showed left lower lobe infiltrate. Urinalysis is been collected.  Patient has had 2 falls since last seen. He fell onto 517 and again on 06/28/2015. That did not seem to be an associated injury with either fall.  Sutures have been removed from previous laceration above the right eyebrow. Area continues to heal without evidence of infection.  Medications: Patient's Medications  New Prescriptions   No medications on file  Previous Medications   ASPIRIN 325 MG EC TABLET    Take 1 tablet (325 mg total) by mouth daily.   GABAPENTIN (NEURONTIN) 300 MG CAPSULE    Take 2 capsules twice daily   LEVOTHYROXINE (SYNTHROID, LEVOTHROID) 75 MCG TABLET    Take 1 tablet (75 mcg total) by mouth daily before breakfast.   LORAZEPAM (ATIVAN) 0.5 MG TABLET    Take 1 tablet (0.5 mg total) by mouth daily.   LORAZEPAM (ATIVAN) 1 MG TABLET    Take 1 tablet (1 mg total) by mouth at bedtime.   MORPHINE SULFATE (MORPHINE CONCENTRATE) 10 MG / 0.5 ML CONCENTRATED SOLUTION    Place 0.25-0.5 mLs (5-10 mg total) under the tongue every 4 (four) hours as needed for moderate pain, severe pain, anxiety or shortness of breath.   PAROXETINE (PAXIL) 10 MG TABLET    1 daily for depression   RISPERIDONE (RISPERDAL) 0.5 MG TABLET    Take one each morning for schizophrenia   RISPERIDONE (RISPERDAL) 1 MG TABLET    Take one at bedtime for schizophrenia   SENNA-DOCUSATE (SENOKOT-S) 8.6-50 MG TABLET    Take 2 tablets by mouth at bedtime.  Modified Medications   No medications on file    Discontinued Medications   No medications on file     Review of Systems  Constitutional: Positive for fever, appetite change and fatigue. Negative for activity change and unexpected weight change.  HENT: Positive for dental problem. Negative for congestion, ear pain, hearing loss, rhinorrhea, sore throat, tinnitus, trouble swallowing and voice change.   Eyes: Negative.   Respiratory: Positive for cough and shortness of breath. Negative for choking, chest tightness and wheezing.   Cardiovascular: Negative for chest pain, palpitations and leg swelling.  Gastrointestinal: Negative for nausea, abdominal pain, diarrhea, constipation and abdominal distention.  Endocrine: Negative for cold intolerance, heat intolerance, polydipsia, polyphagia and polyuria.       Hyperglycemia noted when last hospitalized  Genitourinary: Negative for dysuria, urgency, frequency and testicular pain.        incontinent  Musculoskeletal: Positive for gait problem (recurrent falls). Negative for myalgias, back pain, arthralgias and neck pain.  Skin: Negative for color change, pallor and rash.  Allergic/Immunologic: Negative.   Neurological: Negative for dizziness, tremors, syncope, speech difficulty, weakness, numbness and headaches.       History of mental retardation. Appears demented.  Hematological: Negative for adenopathy. Does not bruise/bleed easily.  Psychiatric/Behavioral: Positive for confusion. Negative for hallucinations, behavioral problems, sleep disturbance and decreased concentration. The patient is not nervous/anxious.  History schizophrenia with paranoia    Filed Vitals:   07/01/15 1534  BP: 80/40  Pulse: 101  Temp: 99.9 F (37.7 C)  Resp: 20   Wt Readings from Last 3 Encounters:  06/17/15 170 lb (77.111 kg)  09/29/13 140 lb 6.4 oz (63.685 kg)  08/20/13 160 lb (72.576 kg)    There is no weight on file to calculate BMI.  Physical Exam  Constitutional: He is oriented to person,  place, and time. No distress.  Extremely thin.  HENT:  Right Ear: External ear normal.  Left Ear: External ear normal.  Nose: Nose normal.  Mouth/Throat: Oropharynx is clear and moist. No oropharyngeal exudate.  Eyes: Conjunctivae and EOM are normal. Pupils are equal, round, and reactive to light.  Anisocoria with right pupil larger than the left.  Neck: No JVD present. No tracheal deviation present. No thyromegaly present.  Cardiovascular: Normal rate, regular rhythm, normal heart sounds and intact distal pulses.  Exam reveals no gallop and no friction rub.   No murmur heard. Pulmonary/Chest: No respiratory distress. He has no wheezes. He has no rales. He exhibits no tenderness.  Abdominal: He exhibits no distension and no mass. There is no tenderness.  Musculoskeletal: Normal range of motion. He exhibits no edema or tenderness.  Lymphadenopathy:    He has no cervical adenopathy.  Neurological: He is alert and oriented to person, place, and time. He has normal reflexes. No cranial nerve deficit. Coordination normal.  Skin: No rash noted. No erythema. No pallor.  Sutures have been removed from the previous laceration above right eyebrow. There continues to be surrounding hematoma and bruising.  Psychiatric:  Patient does not interact with the examiner     Labs reviewed: Lab Summary Latest Ref Rng 06/25/2015 06/20/2015 06/19/2015  Hemoglobin 13.0 - 17.0 g/dL 12.1(L) 11.9(L) 13.6  Hematocrit 39.0 - 52.0 % 36.4(L) 35.8(L) 39.9  White count 4.0 - 10.5 K/uL 12.5(H) 10.4 14.2(H)  Platelet count 150 - 400 K/uL 335 274 306  Sodium 135 - 145 mmol/L 145 142 139  Potassium 3.5 - 5.1 mmol/L 3.2(L) 3.5 3.3(L)  Calcium 8.9 - 10.3 mg/dL 8.7(L) 8.5(L) 8.5(L)  Phosphorus - (None) (None) (None)  Creatinine 0.61 - 1.24 mg/dL 0.88 0.58(L) 0.68  AST 15 - 41 U/L 49(H) (None) (None)  Alk Phos 38 - 126 U/L 136(H) (None) (None)  Bilirubin 0.3 - 1.2 mg/dL 0.8 (None) (None)  Glucose 65 - 99 mg/dL 169(H)  127(H) 124(H)  Cholesterol - (None) (None) (None)  HDL cholesterol - (None) (None) (None)  Triglycerides - (None) (None) (None)  LDL Direct - (None) (None) (None)  LDL Calc - (None) (None) (None)  Total protein 6.5 - 8.1 g/dL 6.2(L) (None) (None)  Albumin 3.5 - 5.0 g/dL 3.1(L) (None) (None)   Lab Results  Component Value Date   TSH 1.062 06/18/2015   Lab Results  Component Value Date   BUN 26* 06/25/2015   BUN 23* 06/20/2015   BUN 8 06/19/2015   Lab Results  Component Value Date   CREATININE 0.88 06/25/2015   CREATININE 0.58* 06/20/2015   CREATININE 0.68 06/19/2015   Lab Results  Component Value Date   HGBA1C 6.1* 06/18/2015   HGBA1C 5.9* 08/12/2013       Assessment/Plan  1. CAP (community acquired pneumonia) Patient appears to be dying. Prognosis is extremely poor. Rocephin 1 g IM daily 7 days was started today  2. History of fall Recurrent episodes of falling without associated injury in the last 2 falls,  but he did have a forehead laceration prior to his hospitalization.  3. Forehead laceration, initial encounter Healing. Sutures have been removed.

## 2015-07-21 DEATH — deceased

## 2016-07-21 IMAGING — CT CT HEAD W/O CM
2 series · 15 of 30 positions shown, 17 images · non-contrast
Comparison: 06/18/2015 brain MRI.

Cervical spine and head CT 06/18/2015

CLINICAL DATA: Unwitnessed fall with right eye laceration. Initial
encounter.

EXAM:
CT HEAD WITHOUT CONTRAST
CT CERVICAL SPINE WITHOUT CONTRAST
TECHNIQUE: Multidetector CT imaging of the head and cervical spine was
performed following the standard protocol without intravenous
contrast. Multiplanar CT image reconstructions of the cervical spine
were also generated.

[Series 2: head without · axial · non-contrast · 0.45mm/px · z∈[-78,+56]mm · 7 of 37 slices shown, 9 images]
[im 5/37  brain]
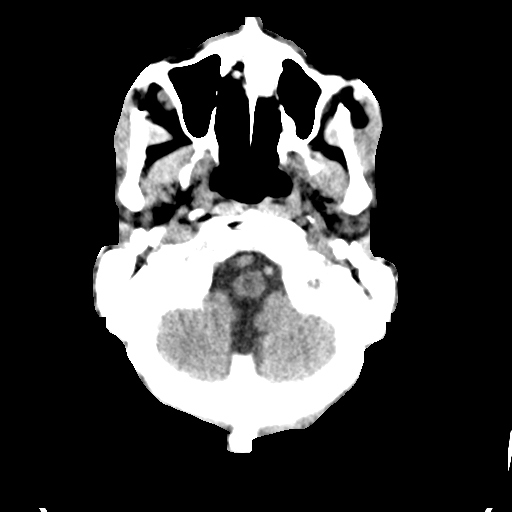
[im 5/37  bone]
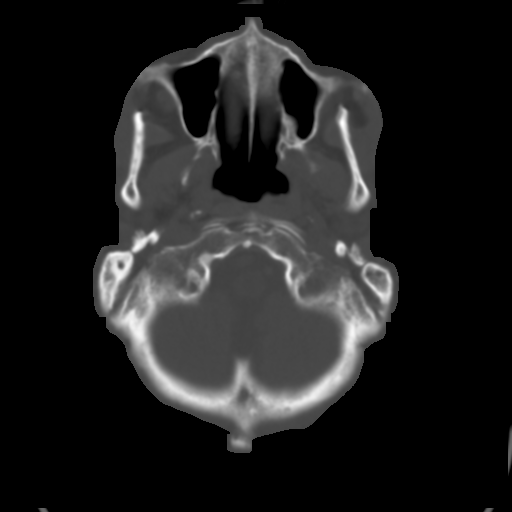
[im 10/37  brain]
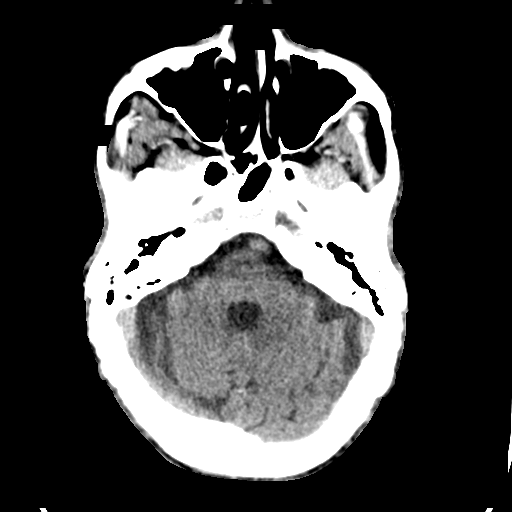
[im 14/37  brain]
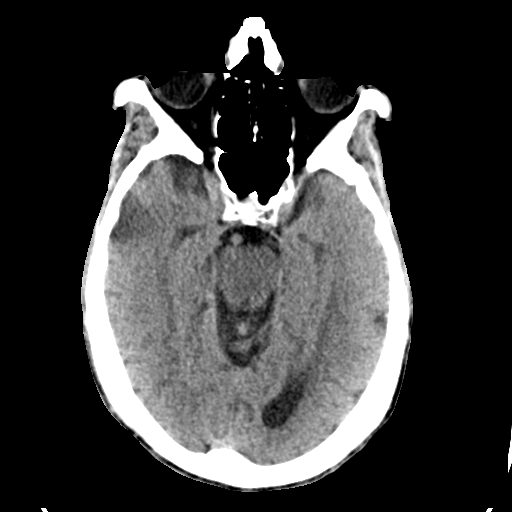
[im 19/37  brain]
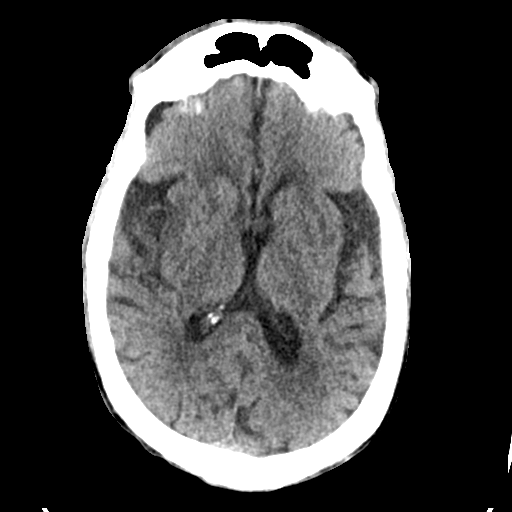
[im 23/37  brain]
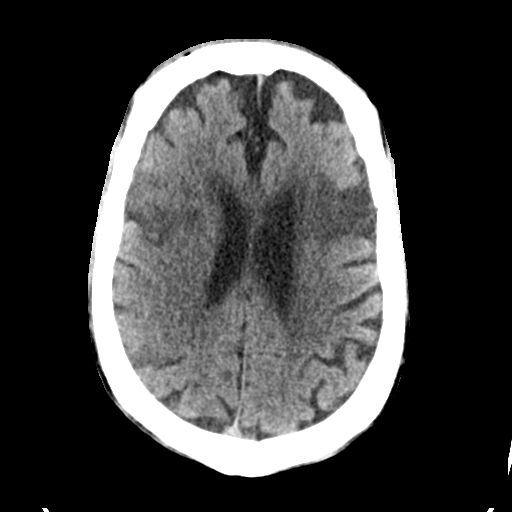
[im 23/37  bone]
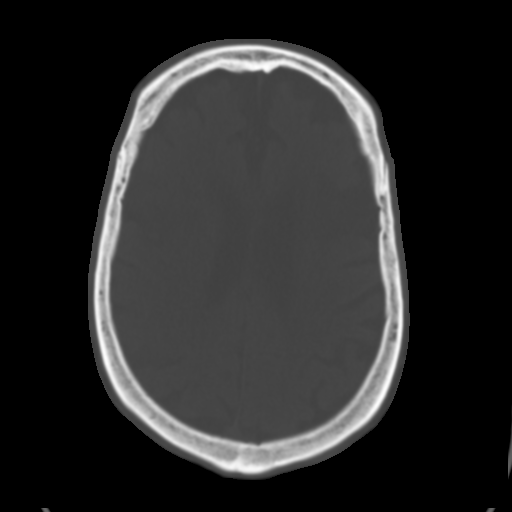
[im 28/37  brain]
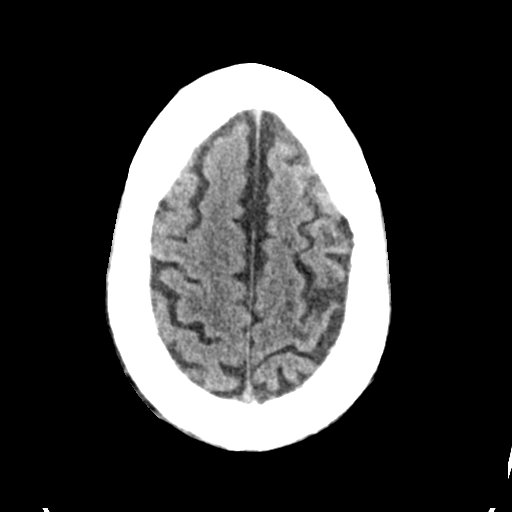
[im 32/37  brain]
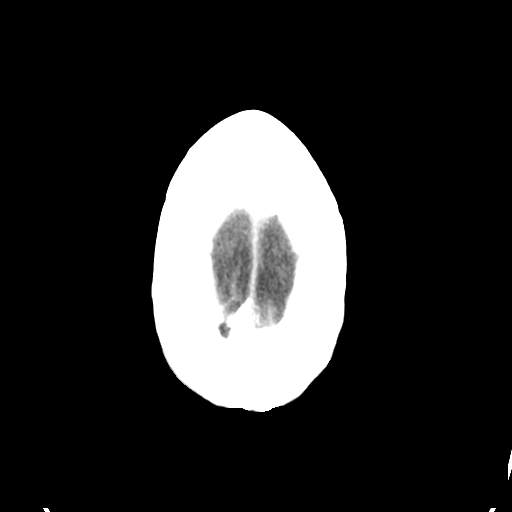

[Series 3: head bone · axial · 0.45mm/px · z∈[-80,+64]mm · 8 of 92 slices shown]
[im 10/92  bone]
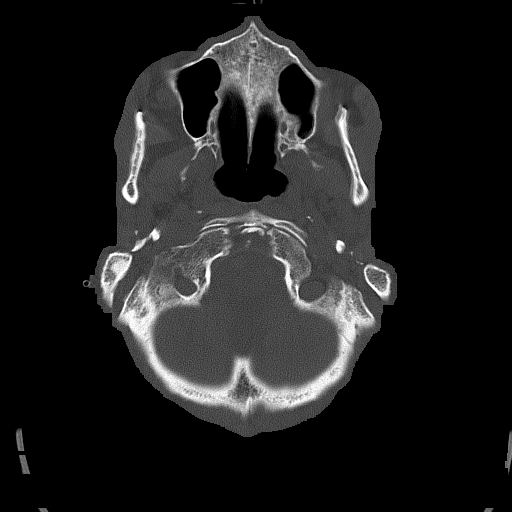
[im 19/92  bone]
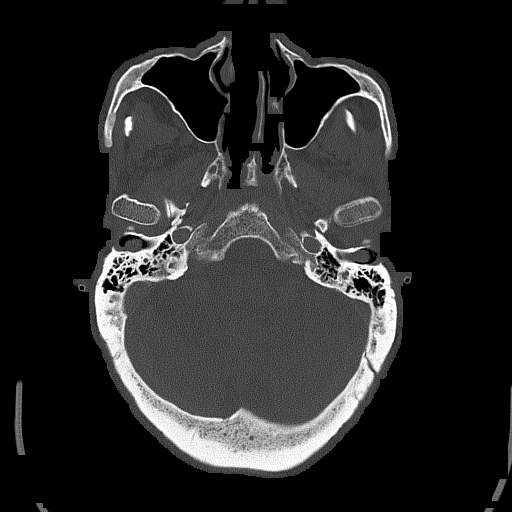
[im 28/92  bone]
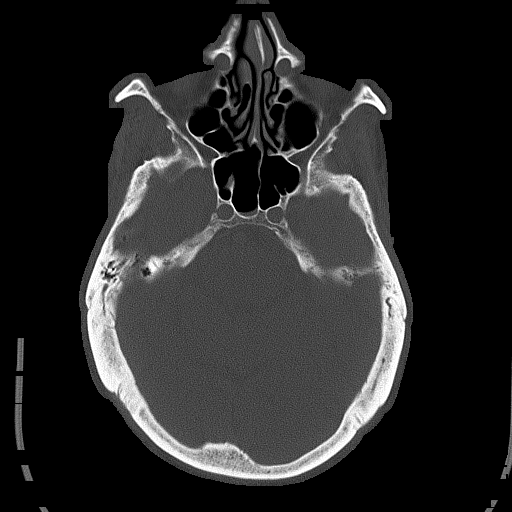
[im 41/92  bone]
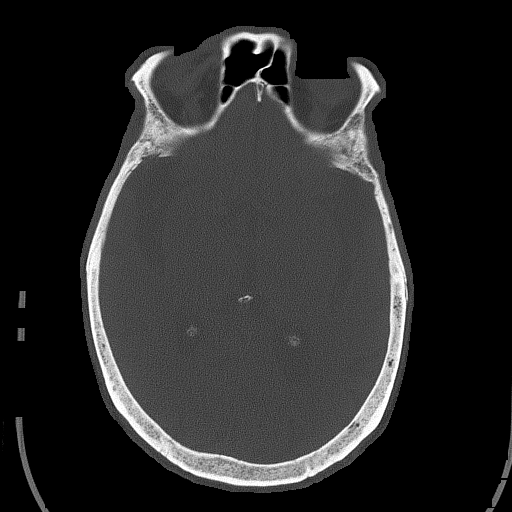
[im 51/92  bone]
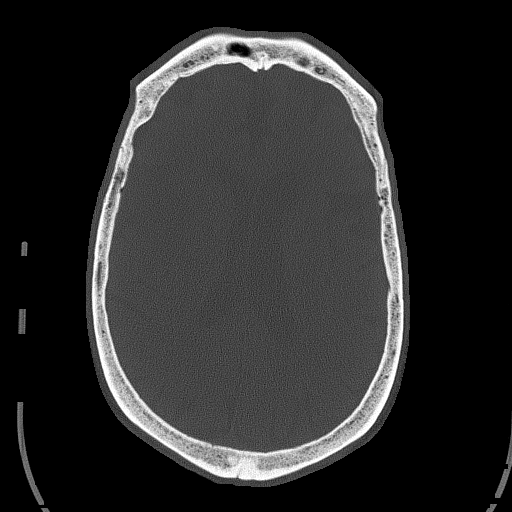
[im 64/92  bone]
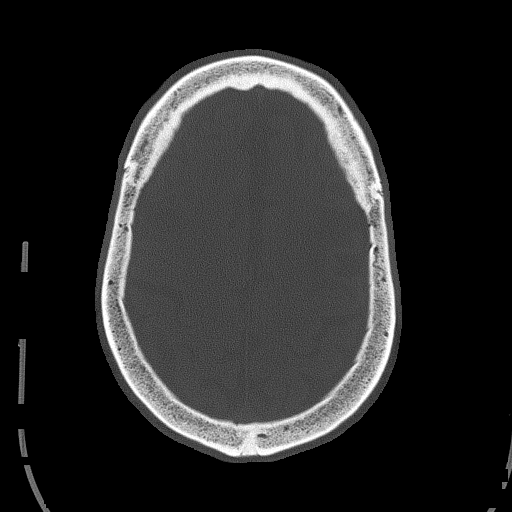
[im 73/92  bone]
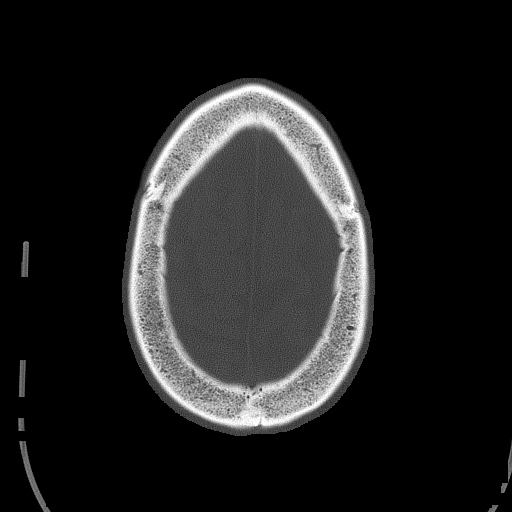
[im 82/92  bone]
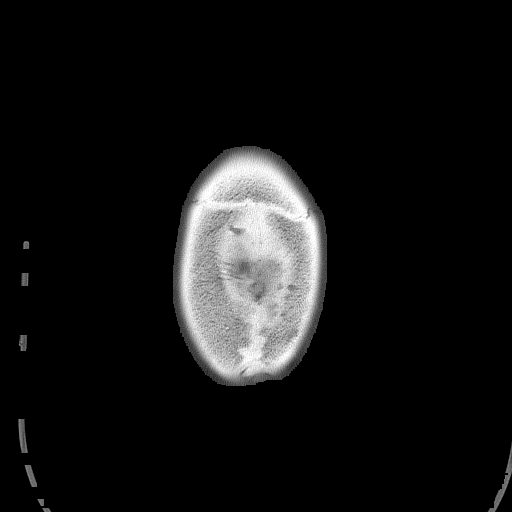

[15 of 30 positions shown; findings below may reference images not displayed]

FINDINGS: CT HEAD FINDINGS

Skull and Sinuses:There is a large laceration in the right forehead
with gas extending to bone. No opaque foreign body or fracture.

Visualized orbits: Negative.

Brain: No posttraumatic findings such as hemorrhage or swelling.

There is a indistinct low-density of right insula and anterior
frontal cortex and subcortical white matter consistent with infarct
seen May 2015. Smaller infarcts seen on comparison brain MRI are
not well visualized. There is a more remote, similar distribution
infarct in the left frontal lobe. Chronic small-vessel disease with
cerebral white matter ischemic gliosis that is diffuse.

CT CERVICAL SPINE FINDINGS

Due to patient's mental status/dementia there is unavoidable motion,
which primarily affected the C4 to C7 vertebrae. There is no
indication of fracture or traumatic malalignment. Diffuse disc
narrowing and facet spurring. No gross canal hematoma or
prevertebral edema.
IMPRESSION: 1. Right forehead laceration without intracranial hemorrhage or
fracture.
2. Motion degraded cervical CT which could obscure pathology. No
evidence of cervical spine injury.
3. Bilateral frontal infarcts, late subacute on the right and
chronic on the left.
